# Patient Record
Sex: Female | Born: 1985
Health system: Southern US, Community
[De-identification: ages and names within clinical notes are randomized; demographics above are authoritative.]

## PROBLEM LIST (undated history)

## (undated) ENCOUNTER — Inpatient Hospital Stay (HOSPITAL_COMMUNITY): Payer: Self-pay

## (undated) DIAGNOSIS — I10 Essential (primary) hypertension: Secondary | ICD-10-CM

## (undated) DIAGNOSIS — E119 Type 2 diabetes mellitus without complications: Secondary | ICD-10-CM

## (undated) DIAGNOSIS — D509 Iron deficiency anemia, unspecified: Secondary | ICD-10-CM

## (undated) DIAGNOSIS — E282 Polycystic ovarian syndrome: Secondary | ICD-10-CM

## (undated) HISTORY — PX: LAPAROSCOPIC GASTRIC BANDING: SHX1100

## (undated) HISTORY — PX: TONSILLECTOMY: SUR1361

## (undated) HISTORY — DX: Polycystic ovarian syndrome: E28.2

## (undated) HISTORY — DX: Type 2 diabetes mellitus without complications: E11.9

## (undated) HISTORY — DX: Essential (primary) hypertension: I10

## (undated) HISTORY — PX: WISDOM TOOTH EXTRACTION: SHX21

## (undated) HISTORY — DX: Iron deficiency anemia, unspecified: D50.9

---

## 2008-08-07 ENCOUNTER — Inpatient Hospital Stay (HOSPITAL_COMMUNITY): Admission: EM | Admit: 2008-08-07 | Discharge: 2008-08-10 | Payer: Self-pay | Admitting: Emergency Medicine

## 2008-08-28 ENCOUNTER — Ambulatory Visit: Payer: Self-pay | Admitting: Internal Medicine

## 2008-08-30 ENCOUNTER — Ambulatory Visit: Payer: Self-pay | Admitting: *Deleted

## 2008-08-30 ENCOUNTER — Ambulatory Visit: Payer: Self-pay | Admitting: Internal Medicine

## 2008-10-03 ENCOUNTER — Ambulatory Visit: Payer: Self-pay | Admitting: Internal Medicine

## 2008-10-03 ENCOUNTER — Encounter: Payer: Self-pay | Admitting: Family Medicine

## 2008-10-03 LAB — CONVERTED CEMR LAB
ALT: 13 units/L (ref 0–35)
AST: 12 units/L (ref 0–37)
Basophils Absolute: 0 10*3/uL (ref 0.0–0.1)
Basophils Relative: 0 % (ref 0–1)
Chloride: 107 meq/L (ref 96–112)
Creatinine, Ser: 0.8 mg/dL (ref 0.40–1.20)
Eosinophils Relative: 1 % (ref 0–5)
Hemoglobin: 11.1 g/dL — ABNORMAL LOW (ref 12.0–15.0)
MCHC: 30.2 g/dL (ref 30.0–36.0)
Microalb, Ur: 1.13 mg/dL (ref 0.00–1.89)
Monocytes Absolute: 0.6 10*3/uL (ref 0.1–1.0)
Neutro Abs: 4.3 10*3/uL (ref 1.7–7.7)
Platelets: 261 10*3/uL (ref 150–400)
RDW: 14.4 % (ref 11.5–15.5)
Sodium: 142 meq/L (ref 135–145)
Testosterone: 71.25 ng/dL — ABNORMAL HIGH (ref 10–70)
Total Bilirubin: 0.3 mg/dL (ref 0.3–1.2)
Total Protein: 7.6 g/dL (ref 6.0–8.3)

## 2008-10-11 ENCOUNTER — Ambulatory Visit: Payer: Self-pay | Admitting: Internal Medicine

## 2009-05-25 ENCOUNTER — Ambulatory Visit: Payer: Self-pay | Admitting: Internal Medicine

## 2009-10-02 ENCOUNTER — Ambulatory Visit: Payer: Self-pay | Admitting: Internal Medicine

## 2009-10-02 DIAGNOSIS — I10 Essential (primary) hypertension: Secondary | ICD-10-CM | POA: Insufficient documentation

## 2009-10-02 DIAGNOSIS — D509 Iron deficiency anemia, unspecified: Secondary | ICD-10-CM | POA: Insufficient documentation

## 2009-10-02 DIAGNOSIS — E119 Type 2 diabetes mellitus without complications: Secondary | ICD-10-CM | POA: Insufficient documentation

## 2009-10-02 DIAGNOSIS — E669 Obesity, unspecified: Secondary | ICD-10-CM | POA: Insufficient documentation

## 2009-10-02 LAB — CONVERTED CEMR LAB
BUN: 10 mg/dL (ref 6–23)
Basophils Absolute: 0 10*3/uL (ref 0.0–0.1)
Basophils Relative: 0.3 % (ref 0.0–3.0)
Bilirubin Urine: NEGATIVE
Bilirubin, Direct: 0 mg/dL (ref 0.0–0.3)
CO2: 28 meq/L (ref 19–32)
Chloride: 108 meq/L (ref 96–112)
Cholesterol: 174 mg/dL (ref 0–200)
Creatinine, Ser: 0.8 mg/dL (ref 0.4–1.2)
Eosinophils Absolute: 0.1 10*3/uL (ref 0.0–0.7)
Glucose, Bld: 143 mg/dL — ABNORMAL HIGH (ref 70–99)
Hgb A1c MFr Bld: 7.1 % — ABNORMAL HIGH (ref 4.6–6.5)
Leukocytes, UA: NEGATIVE
MCHC: 33.9 g/dL (ref 30.0–36.0)
MCV: 83.5 fL (ref 78.0–100.0)
Monocytes Absolute: 0.4 10*3/uL (ref 0.1–1.0)
Neutrophils Relative %: 48.7 % (ref 43.0–77.0)
Nitrite: NEGATIVE
Platelets: 227 10*3/uL (ref 150.0–400.0)
RDW: 13.1 % (ref 11.5–14.6)
Total Bilirubin: 0.4 mg/dL (ref 0.3–1.2)
Total Protein: 7.9 g/dL (ref 6.0–8.3)
Triglycerides: 119 mg/dL (ref 0.0–149.0)
pH: 5.5 (ref 5.0–8.0)

## 2009-10-03 ENCOUNTER — Telehealth: Payer: Self-pay | Admitting: Internal Medicine

## 2009-10-04 ENCOUNTER — Encounter: Payer: Self-pay | Admitting: Internal Medicine

## 2009-10-18 ENCOUNTER — Ambulatory Visit: Payer: Self-pay | Admitting: Internal Medicine

## 2009-10-19 ENCOUNTER — Ambulatory Visit (HOSPITAL_COMMUNITY): Admission: RE | Admit: 2009-10-19 | Discharge: 2009-10-19 | Payer: Self-pay | Admitting: Internal Medicine

## 2009-10-19 ENCOUNTER — Encounter: Payer: Self-pay | Admitting: Internal Medicine

## 2009-10-19 ENCOUNTER — Ambulatory Visit: Payer: Self-pay

## 2009-10-19 ENCOUNTER — Ambulatory Visit: Payer: Self-pay | Admitting: Internal Medicine

## 2009-10-24 ENCOUNTER — Encounter: Admission: RE | Admit: 2009-10-24 | Discharge: 2010-01-22 | Payer: Self-pay | Admitting: Internal Medicine

## 2009-11-06 ENCOUNTER — Ambulatory Visit: Payer: Self-pay | Admitting: Internal Medicine

## 2009-11-19 ENCOUNTER — Encounter: Payer: Self-pay | Admitting: Internal Medicine

## 2009-12-27 ENCOUNTER — Encounter: Payer: Self-pay | Admitting: Internal Medicine

## 2010-01-04 ENCOUNTER — Ambulatory Visit: Payer: Self-pay | Admitting: Internal Medicine

## 2010-02-08 ENCOUNTER — Emergency Department (HOSPITAL_COMMUNITY): Admission: EM | Admit: 2010-02-08 | Discharge: 2010-02-08 | Payer: Self-pay | Admitting: Emergency Medicine

## 2010-03-04 ENCOUNTER — Ambulatory Visit: Payer: Self-pay | Admitting: Internal Medicine

## 2010-03-04 LAB — CONVERTED CEMR LAB
CO2: 28 meq/L (ref 19–32)
Glucose, Bld: 309 mg/dL — ABNORMAL HIGH (ref 70–99)
Potassium: 4.2 meq/L (ref 3.5–5.1)
Sodium: 137 meq/L (ref 135–145)

## 2010-04-13 ENCOUNTER — Inpatient Hospital Stay (HOSPITAL_COMMUNITY): Admission: EM | Admit: 2010-04-13 | Discharge: 2010-04-14 | Payer: Self-pay | Admitting: Emergency Medicine

## 2010-08-06 ENCOUNTER — Ambulatory Visit (HOSPITAL_COMMUNITY)
Admission: RE | Admit: 2010-08-06 | Discharge: 2010-08-06 | Payer: Self-pay | Source: Home / Self Care | Admitting: General Surgery

## 2010-08-15 ENCOUNTER — Emergency Department (HOSPITAL_COMMUNITY): Admission: EM | Admit: 2010-08-15 | Discharge: 2009-10-17 | Payer: Self-pay | Admitting: Emergency Medicine

## 2010-08-27 ENCOUNTER — Ambulatory Visit: Payer: Self-pay | Admitting: Internal Medicine

## 2010-08-27 DIAGNOSIS — N949 Unspecified condition associated with female genital organs and menstrual cycle: Secondary | ICD-10-CM

## 2010-08-27 DIAGNOSIS — N938 Other specified abnormal uterine and vaginal bleeding: Secondary | ICD-10-CM | POA: Insufficient documentation

## 2010-08-27 LAB — CONVERTED CEMR LAB
ALT: 21 units/L (ref 0–35)
Albumin: 3.5 g/dL (ref 3.5–5.2)
BUN: 13 mg/dL (ref 6–23)
Basophils Absolute: 0 10*3/uL (ref 0.0–0.1)
Bilirubin Urine: NEGATIVE
CO2: 26 meq/L (ref 19–32)
Chloride: 106 meq/L (ref 96–112)
Glucose, Bld: 179 mg/dL — ABNORMAL HIGH (ref 70–99)
HCT: 35.8 % — ABNORMAL LOW (ref 36.0–46.0)
Iron: 69 ug/dL (ref 42–145)
Lymphs Abs: 3.3 10*3/uL (ref 0.7–4.0)
MCV: 86 fL (ref 78.0–100.0)
Monocytes Absolute: 0.5 10*3/uL (ref 0.1–1.0)
Nitrite: NEGATIVE
Platelets: 232 10*3/uL (ref 150.0–400.0)
Potassium: 4.2 meq/L (ref 3.5–5.1)
RDW: 13.7 % (ref 11.5–14.6)
Specific Gravity, Urine: >=1.03 (ref 1.000–1.030)
TSH: 1.3 microintl units/mL (ref 0.35–5.50)
Total Bilirubin: 0.6 mg/dL (ref 0.3–1.2)
Total Protein, Urine: NEGATIVE mg/dL
Transferrin: 242.9 mg/dL (ref 212.0–360.0)
Vitamin B-12: 232 pg/mL (ref 211–911)
pH: 5.5 (ref 5.0–8.0)

## 2010-09-25 ENCOUNTER — Encounter
Admission: RE | Admit: 2010-09-25 | Discharge: 2010-10-08 | Payer: Self-pay | Source: Home / Self Care | Attending: General Surgery | Admitting: General Surgery

## 2010-09-28 ENCOUNTER — Encounter: Payer: Self-pay | Admitting: General Surgery

## 2010-09-29 ENCOUNTER — Encounter: Payer: Self-pay | Admitting: General Surgery

## 2010-10-03 ENCOUNTER — Encounter: Admit: 2010-10-03 | Payer: Self-pay | Admitting: General Surgery

## 2010-10-08 ENCOUNTER — Ambulatory Visit (HOSPITAL_COMMUNITY)
Admission: RE | Admit: 2010-10-08 | Discharge: 2010-10-08 | Payer: Self-pay | Source: Home / Self Care | Attending: General Surgery | Admitting: General Surgery

## 2010-10-08 NOTE — Assessment & Plan Note (Signed)
Summary: COUGH/NWS   Vital Signs:  Patient profile:   25 year old female Menstrual status:  regular Height:      69 inches Weight:      282 pounds BMI:     41.79 O2 Sat:      97 % on Room air Temp:     98.1 degrees F oral Pulse rate:   102 / minute Pulse rhythm:   regular Resp:     16 per minute BP sitting:   130 / 84  (left arm) Cuff size:   large  Vitals Entered By: Rock Nephew CMA (January 04, 2010 1:58 PM)  Nutrition Counseling: Patient's BMI is greater than 25 and therefore counseled on weight management options.  O2 Flow:  Room air CC: URI symptoms CBG Result 218   Primary Care Provider:  Etta Grandchild MD  CC:  URI symptoms.  History of Present Illness:  URI Symptoms      This is a 25 year old woman who presents with URI symptoms.  The symptoms began 1 week ago.  The severity is described as moderate.  The patient reports nasal congestion, purulent nasal discharge, sore throat, productive cough, earache, and sick contacts.  Associated symptoms include fever of 100.5-103 degrees.  The patient denies stiff neck, dyspnea, wheezing, rash, vomiting, diarrhea, use of an antipyretic, and response to antipyretic.  The patient also reports headache, muscle aches, and severe fatigue.  The patient denies sneezing, seasonal symptoms, and response to antihistamine.    Preventive Screening-Counseling & Management  Alcohol-Tobacco     Alcohol drinks/day: <1     Alcohol type: wine     >5/day in last 3 mos: no     Alcohol Counseling: not indicated; use of alcohol is not excessive or problematic     Feels need to cut down: no     Feels annoyed by complaints: no     Feels guilty re: drinking: no     Needs 'eye opener' in am: no     Smoking Status: never  Clinical Review Panels:  Lipid Management   Cholesterol:  174 (10/02/2009)   LDL (bad choesterol):  101 (10/02/2009)   HDL (good cholesterol):  49.70 (10/02/2009)  Diabetes Management   HgBA1C:  7.1 (10/02/2009)  Creatinine:  0.8 (10/02/2009)   Last Dilated Eye Exam:  normal (09/10/2009)   Last Foot Exam:  yes (01/04/2010)  CBC   WBC:  7.1 (10/02/2009)   RBC:  4.30 (10/02/2009)   Hgb:  12.2 (10/02/2009)   Hct:  35.9 (10/02/2009)   Platelets:  227.0 (10/02/2009)   MCV  83.5 (10/02/2009)   MCHC  33.9 (10/02/2009)   RDW  13.1 (10/02/2009)   PMN:  48.7 (10/02/2009)   Lymphs:  43.4 (10/02/2009)   Monos:  6.2 (10/02/2009)   Eosinophils:  1.4 (10/02/2009)   Basophil:  0.3 (10/02/2009)  Complete Metabolic Panel   Glucose:  143 (10/02/2009)   Sodium:  140 (10/02/2009)   Potassium:  3.9 (10/02/2009)   Chloride:  108 (10/02/2009)   CO2:  28 (10/02/2009)   BUN:  10 (10/02/2009)   Creatinine:  0.8 (10/02/2009)   Albumin:  3.9 (10/02/2009)   Total Protein:  7.9 (10/02/2009)   Calcium:  9.5 (10/02/2009)   Total Bili:  0.4 (10/02/2009)   Alk Phos:  64 (10/02/2009)   SGPT (ALT):  23 (10/02/2009)   SGOT (AST):  18 (10/02/2009)   Medications Prior to Update: 1)  Lantus 100 Unit/ml Soln (Insulin Glargine) .... 40  Units At Bedtime 2)  Toprol Xl 50 Mg Xr24h-Tab (Metoprolol Succinate) .... One By Mouth Daily 3)  Metformin Hcl 1000 Mg Tabs (Metformin Hcl) .... Take 1 Tablet By Mouth Once A Day 4)  Mirena 20 Mcg/24hr Iud (Levonorgestrel) .... Iud 5)  Truetrack Test  Strp (Glucose Blood) .... Test Up To Two Times A Day  Dx:250.00 6)  Lancets  Misc (Lancets) .... Test Up To Two Times A Day 7)  Onetouch Ultra Mini W/device Kit (Blood Glucose Monitoring Suppl) .... Use Once Daily As Directed 8)  Glucotrol Xl 10 Mg Xr24h-Tab (Glipizide) .... Take One Tablet By Mouth Once Daily.  Current Medications (verified): 1)  Lantus 100 Unit/ml Soln (Insulin Glargine) .... 40 Units At Bedtime 2)  Toprol Xl 50 Mg Xr24h-Tab (Metoprolol Succinate) .... One By Mouth Daily 3)  Metformin Hcl 1000 Mg Tabs (Metformin Hcl) .... Take 1 Tablet By Mouth Two Times A Day 4)  Mirena 20 Mcg/24hr Iud (Levonorgestrel) .... Iud 5)   Truetrack Test  Strp (Glucose Blood) .... Test Up To Two Times A Day  Dx:250.00 6)  Lancets  Misc (Lancets) .... Test Up To Two Times A Day 7)  Onetouch Ultra Mini W/device Kit (Blood Glucose Monitoring Suppl) .... Use Once Daily As Directed 8)  Glucotrol Xl 10 Mg Xr24h-Tab (Glipizide) .... Take One Tablet By Mouth Once Daily. 9)  Zithromax Tri-Pak 500 Mg Tab (Azithromycin) .... Take As Directed One By Mouth Once Daily For 3 Days 10)  Mytussin Dac 30-10-100 Mg/39ml Soln (Pseudoephedrine-Codeine-Gg) .... 5-10 Ml By Mouth Qid As Needed For Congestion  Allergies (verified): No Known Drug Allergies  Past History:  Past Medical History: Reviewed history from 10/02/2009 and no changes required. Anemia-iron deficiency Diabetes mellitus, type II Hypertension PCOS  Past Surgical History: Reviewed history from 10/02/2009 and no changes required. Tonsillectomy  Family History: Reviewed history from 10/02/2009 and no changes required. Family History of Arthritis Family History Diabetes 1st degree relative Family History High cholesterol Family History Hypertension Family History of Prostate CA 1st degree relative <50  Social History: Reviewed history from 10/02/2009 and no changes required. 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  The patient denies anorexia, hemoptysis, abdominal pain, hematuria, suspicious skin lesions, and enlarged lymph nodes.   Endo:  Complains of weight change; denies cold intolerance, excessive hunger, excessive thirst, excessive urination, heat intolerance, and polyuria.  Physical Exam  General:  alert, well-developed, well-nourished, well-hydrated, appropriate dress, cooperative to examination, good hygiene, and overweight-appearing.   Eyes:  vision grossly intact, pupils equal, pupils round, and pupils reactive to light.   Ears:  R ear  normal and L ear normal.   Mouth:  Oral mucosa and oropharynx without lesions or exudates.  Teeth in good repair. Neck:  supple, full ROM, no masses, no thyromegaly, no JVD, normal carotid upstroke, no carotid bruits, and no cervical lymphadenopathy.   Lungs:  normal respiratory effort, no intercostal retractions, no accessory muscle use, normal breath sounds, no dullness, no fremitus, no crackles, and no wheezes.   Heart:  Regular rhythm, no murmur, no gallop, no rub, and no JVD.  normal rate, regular rhythm, no murmur, no gallop, no rub, no JVD, and tachycardia.   Abdomen:  soft, non-tender, normal bowel sounds, no distention, no masses, no guarding, no rigidity, no rebound tenderness, no hepatomegaly, and no splenomegaly.   Msk:  normal  ROM, no joint tenderness, no joint swelling, no joint warmth, no redness over joints, no joint deformities, no joint instability, and no crepitation.   Pulses:  R and L carotid,radial,femoral,dorsalis pedis and posterior tibial pulses are full and equal bilaterally Extremities:  No clubbing, cyanosis, edema, or deformity noted with normal full range of motion of all joints.   Neurologic:  No cranial nerve deficits noted. Station and gait are normal. Plantar reflexes are down-going bilaterally. DTRs are symmetrical throughout. Sensory, motor and coordinative functions appear intact. Skin:  Intact without suspicious lesions or rashes Cervical Nodes:  No lymphadenopathy noted Axillary Nodes:  no R axillary adenopathy and no L axillary adenopathy.   Inguinal Nodes:  No significant adenopathy Psych:  Cognition and judgment appear intact. Alert and cooperative with normal attention span and concentration. No apparent delusions, illusions, hallucinations  Diabetes Management Exam:    Foot Exam (with socks and/or shoes not present):       Sensory-Pinprick/Light touch:          Left medial foot (L-4): normal          Left dorsal foot (L-5): normal          Left lateral  foot (S-1): normal          Right medial foot (L-4): normal          Right dorsal foot (L-5): normal          Right lateral foot (S-1): normal       Sensory-Monofilament:          Left foot: normal          Right foot: normal       Inspection:          Left foot: normal          Right foot: normal       Nails:          Left foot: normal          Right foot: normal   Impression & Recommendations:  Problem # 1:  BRONCHITIS-ACUTE (ICD-466.0) Assessment New  Her updated medication list for this problem includes:    Zithromax Tri-pak 500 Mg Tab (Azithromycin) .Marland Kitchen... Take as directed one by mouth once daily for 3 days    Mytussin Dac 30-10-100 Mg/60ml Soln (Pseudoephedrine-codeine-gg) .Marland Kitchen... 5-10 ml by mouth qid as needed for congestion  Problem # 2:  COUGH (ICD-786.2) Assessment: New Chest xary to look for pna, edema, etc. Orders: T-2 View CXR (71020TC)  Problem # 3:  DIABETES MELLITUS, TYPE II (ICD-250.00) Assessment: Deteriorated  I increased he dose of metformin today Her updated medication list for this problem includes:    Lantus 100 Unit/ml Soln (Insulin glargine) .Marland KitchenMarland KitchenMarland KitchenMarland Kitchen 40 units at bedtime    Metformin Hcl 1000 Mg Tabs (Metformin hcl) .Marland Kitchen... Take 1 tablet by mouth two times a day    Glucotrol Xl 10 Mg Xr24h-tab (Glipizide) .Marland Kitchen... Take one tablet by mouth once daily.  Labs Reviewed: Creat: 0.8 (10/02/2009)     Last Eye Exam: normal (09/10/2009) Reviewed HgBA1c results: 7.1 (10/02/2009)  Problem # 4:  HYPERTENSION (ICD-401.9) Assessment: Unchanged  Her updated medication list for this problem includes:    Toprol Xl 50 Mg Xr24h-tab (Metoprolol succinate) ..... One by mouth daily  BP today: 130/84 Prior BP: 126/84 (11/06/2009)  Prior 10 Yr Risk Heart Disease: 1 % (11/06/2009)  Labs Reviewed: K+: 3.9 (10/02/2009) Creat: : 0.8 (10/02/2009)   Chol: 174 (10/02/2009)   HDL: 49.70 (10/02/2009)  LDL: 101 (10/02/2009)   TG: 119.0 (10/02/2009)  Complete Medication  List: 1)  Lantus 100 Unit/ml Soln (Insulin glargine) .... 40 units at bedtime 2)  Toprol Xl 50 Mg Xr24h-tab (Metoprolol succinate) .... One by mouth daily 3)  Metformin Hcl 1000 Mg Tabs (Metformin hcl) .... Take 1 tablet by mouth two times a day 4)  Mirena 20 Mcg/24hr Iud (Levonorgestrel) .... Iud 5)  Truetrack Test Strp (Glucose blood) .... Test up to two times a day  dx:250.00 6)  Lancets Misc (Lancets) .... Test up to two times a day 7)  Onetouch Ultra Mini W/device Kit (Blood glucose monitoring suppl) .... Use once daily as directed 8)  Glucotrol Xl 10 Mg Xr24h-tab (Glipizide) .... Take one tablet by mouth once daily. 9)  Zithromax Tri-pak 500 Mg Tab (Azithromycin) .... Take as directed one by mouth once daily for 3 days 10)  Mytussin Dac 30-10-100 Mg/60ml Soln (Pseudoephedrine-codeine-gg) .... 5-10 ml by mouth qid as needed for congestion  Patient Instructions: 1)  Please schedule a follow-up appointment in 2 weeks. 2)  Take your antibiotic as prescribed until ALL of it is gone, but stop if you develop a rash or swelling and contact our office as soon as possible. 3)  Acute bronchitis symptoms for less than 10 days are not helped by antibiotics. take over the counter cough medications. call if no improvment in  5-7 days, sooner if increasing cough, fever, or new symptoms( shortness of breath, chest pain). Prescriptions: METFORMIN HCL 1000 MG TABS (METFORMIN HCL) Take 1 tablet by mouth two times a day  #60 x 11   Entered and Authorized by:   Etta Grandchild MD   Signed by:   Etta Grandchild MD on 01/04/2010   Method used:   Electronically to        [facility] [address] [phone]* (retail)       [address] [place], [place]  [address]       Ph: [phone]       Fax: [phone]   RxID:   [id number] ZITHROMAX TRI-PAK 500 MG TAB (AZITHROMYCIN) Take as directed one by mouth once daily for 3 days  #3 x 0   Entered and Authorized by:   Etta Grandchild MD   Signed by:    Etta Grandchild MD on 01/04/2010   Method used:   Electronically to        [facility] [address] [phone]* (retail)       [address] [place], [place]  [address]       Ph: [phone]       Fax: [phone]   RxID:   [id number] MYTUSSIN DAC 30-10-100 MG/5ML SOLN (PSEUDOEPHEDRINE-CODEINE-GG) 5-10 ml by mouth QID as needed for congestion  #6 ounces x 0   Entered and Authorized by:   Etta Grandchild MD   Signed by:   Etta Grandchild MD on 01/04/2010   Method used:   Print then Give to Patient   RxID:   [id number] ZITHROMAX TRI-PAK 500 MG TAB (AZITHROMYCIN) Take as directed one by mouth once daily for 3 days  #3 x 0   Entered and Authorized by:   Etta Grandchild MD   Signed by:   Etta Grandchild MD on 01/04/2010   Method used:   Electronically to        [facility] (retail)  [address]       [place], [place]  [address]       Ph: [phone]       Fax: [phone]   RxID:   [id number]

## 2010-10-08 NOTE — Letter (Signed)
Summary: Surgical Options notes/[facility]  Options notes/[facility]   Imported By: Lester Collinston 11/21/2009 08:09:39  _____________________________________________________________________  External Attachment:    Type:   Image     Comment:   External Document

## 2010-10-08 NOTE — Letter (Signed)
Summary: Results Follow-up Letter  [facility]  [address] [place], [place] [address]   Phone: [phone]  Fax: [phone]    10/04/2009  [address] [place], [place]  [address]  Dear Ms. BRAXTON,   The following are the results of your recent test(s):  Test     Result     Blood sugar     good average Liver/kidney   normal CBC       normal Thyroid     normal Urine       normal   _________________________________________________________  Please call for an appointment as directed _________________________________________________________ _________________________________________________________ _________________________________________________________  Sincerely,  Sanda Linger MD [facility]

## 2010-10-08 NOTE — Assessment & Plan Note (Signed)
Summary: sugar so high last pm/don't register-loss fee'g in one toe-stc   Vital Signs:  Patient profile:   25 year old female Menstrual status:  regular Height:      69 inches Weight:      268 pounds BMI:     39.72 O2 Sat:      97 % on Room air Temp:     98.6 degrees F oral Pulse rate:   97 / minute Pulse rhythm:   regular Resp:     16 per minute BP sitting:   138 / 88  (left arm) Cuff size:   large  Vitals Entered By: Rock Nephew CMA (March 04, 2010 2:03 PM)  Nutrition Counseling: Patient's BMI is greater than 25 and therefore counseled on weight management options.  O2 Flow:  Room air CC: elevated CBG, wouldnt register on the meter and toe numbness, freq urination Is Patient Diabetic? Yes Did you bring your meter with you today? No Pain Assessment Patient in pain? no      CBG Result 317   Primary Care Provider:  Etta Grandchild MD  CC:  elevated CBG, wouldnt register on the meter and toe numbness, and freq urination.  History of Present Illness:  Follow-Up Visit      This is a 25 year old woman who presents for Follow-up visit.  The patient complains of high blood sugar symptoms, but denies chest pain, palpitations, dizziness, syncope, low blood sugar symptoms, edema, SOB, DOE, PND, and orthopnea.  Since the last visit the patient notes a recent ED visit.  The patient reports taking meds as prescribed, monitoring BP, monitoring blood sugars, and dietary noncompliance.  When questioned about possible medication side effects, the patient notes nausea.    Preventive Screening-Counseling & Management  Alcohol-Tobacco     Alcohol drinks/day: <1     Alcohol type: wine     >5/day in last 3 mos: no     Alcohol Counseling: not indicated; use of alcohol is not excessive or problematic     Feels need to cut down: no     Feels annoyed by complaints: no     Feels guilty re: drinking: no     Needs 'eye opener' in am: no     Smoking Status:  never  Hep-HIV-STD-Contraception     Hepatitis Risk: risk noted     HIV Risk: no risk noted     STD Risk: no risk noted      Sexual History:  currently monogamous.        Drug Use:  no.        Blood Transfusions:  no.    Medications Prior to Update: 1)  Lantus 100 Unit/ml Soln (Insulin Glargine) .... 40 Units At Bedtime 2)  Toprol Xl 50 Mg Xr24h-Tab (Metoprolol Succinate) .... One By Mouth Daily 3)  Metformin Hcl 1000 Mg Tabs (Metformin Hcl) .... Take 1 Tablet By Mouth Two Times A Day 4)  Mirena 20 Mcg/24hr Iud (Levonorgestrel) .... Iud 5)  Truetrack Test  Strp (Glucose Blood) .... Test Up To Two Times A Day  Dx:250.00 6)  Lancets  Misc (Lancets) .... Test Up To Two Times A Day 7)  Onetouch Ultra Mini W/device Kit (Blood Glucose Monitoring Suppl) .... Use Once Daily As Directed 8)  Glucotrol Xl 10 Mg Xr24h-Tab (Glipizide) .... Take One Tablet By Mouth Once Daily. 9)  Mytussin Dac 30-10-100 Mg/35ml Soln (Pseudoephedrine-Codeine-Gg) .... 5-10 Ml By Mouth Qid As Needed For Congestion  Current Medications (  verified): 1)  Lantus 100 Unit/ml Soln (Insulin Glargine) .... 80 Units At Bedtime 2)  Toprol Xl 50 Mg Xr24h-Tab (Metoprolol Succinate) .... One By Mouth Daily 3)  Mirena 20 Mcg/24hr Iud (Levonorgestrel) .... Iud 4)  Truetrack Test  Strp (Glucose Blood) .... Test Up To Two Times A Day  Dx:250.00 5)  Lancets  Misc (Lancets) .... Test Up To Two Times A Day 6)  Onetouch Ultra Mini W/device Kit (Blood Glucose Monitoring Suppl) .... Use Once Daily As Directed 7)  Glucotrol Xl 10 Mg Xr24h-Tab (Glipizide) .... Take One Tablet By Mouth Once Daily. 8)  Actos 15 Mg Tabs (Pioglitazone Hcl) .... One By Mouth Once Daily 9)  Janumet 50-500 Mg Tabs (Sitagliptin-Metformin Hcl) .... One By Mouth Two Times A Day  Allergies (verified): No Known Drug Allergies  Past History:  Past Medical History: Last updated: 10/02/2009 Anemia-iron deficiency Diabetes mellitus, type  II Hypertension PCOS  Past Surgical History: Last updated: 10/02/2009 Tonsillectomy  Family History: Last updated: 10/02/2009 Family History of Arthritis Family History Diabetes 1st degree relative Family History High cholesterol Family History Hypertension Family History of Prostate CA 1st degree relative <50  Social History: Last updated: 10/02/2009 Occupation: CSR Single Never Smoked Alcohol use-yes Drug use-no Regular exercise-yes  Risk Factors: Alcohol Use: <1 (03/04/2010) >5 drinks/d w/in last 3 months: no (03/04/2010) Exercise: yes (10/02/2009)  Risk Factors: Smoking Status: never (03/04/2010)  Family History: Reviewed history from 10/02/2009 and no changes required. Family History of Arthritis Family History Diabetes 1st degree relative Family History High cholesterol Family History Hypertension Family History of Prostate CA 1st degree relative <50  Social History: Reviewed history from 10/02/2009 and no changes required. 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  The patient denies anorexia, fever, weight gain, chest pain, syncope, dyspnea on exertion, peripheral edema, prolonged cough, headaches, hemoptysis, abdominal pain, hematuria, depression, and unusual weight change.    Physical Exam  General:  alert, well-developed, well-nourished, well-hydrated, appropriate dress, cooperative to examination, good hygiene, and overweight-appearing.   Head:  normocephalic, atraumatic, no abnormalities observed, and no abnormalities palpated.   Eyes:  vision grossly intact, pupils equal, pupils round, and pupils reactive to light.   Mouth:  Oral mucosa and oropharynx without lesions or exudates.  Teeth in good repair. Neck:  supple, full ROM, no masses, no thyromegaly, no JVD, normal carotid upstroke, no carotid bruits, and no cervical lymphadenopathy.    Lungs:  normal respiratory effort, no intercostal retractions, no accessory muscle use, normal breath sounds, no dullness, no fremitus, no crackles, and no wheezes.   Heart:  Regular rhythm, no murmur, no gallop, no rub, and no JVD.  normal rate, regular rhythm, no murmur, no gallop, no rub, no JVD, and tachycardia.   Abdomen:  soft, non-tender, normal bowel sounds, no distention, no masses, no guarding, no rigidity, no rebound tenderness, no hepatomegaly, and no splenomegaly.   Msk:  normal ROM, no joint tenderness, no joint swelling, no joint warmth, no redness over joints, no joint deformities, no joint instability, and no crepitation.   Pulses:  R and L carotid,radial,femoral,dorsalis pedis and posterior tibial pulses are full and equal bilaterally Extremities:  No clubbing, cyanosis, edema, or deformity noted with normal full range of motion of all joints.   Neurologic:  No cranial nerve deficits noted. Station and gait are normal. Plantar reflexes are down-going bilaterally. DTRs are symmetrical throughout.  Sensory, motor and coordinative functions appear intact. Skin:  Intact without suspicious lesions or rashes Cervical Nodes:  No lymphadenopathy noted Psych:  Cognition and judgment appear intact. Alert and cooperative with normal attention span and concentration. No apparent delusions, illusions, hallucinations  Diabetes Management Exam:    Foot Exam (with socks and/or shoes not present):       Sensory-Pinprick/Light touch:          Left medial foot (L-4): normal          Left dorsal foot (L-5): normal          Left lateral foot (S-1): normal          Right medial foot (L-4): normal          Right dorsal foot (L-5): normal          Right lateral foot (S-1): normal       Sensory-Monofilament:          Left foot: normal          Right foot: normal       Inspection:          Left foot: normal          Right foot: normal       Nails:          Left foot: normal          Right foot:  normal   Impression & Recommendations:  Problem # 1:  DIABETES MELLITUS, TYPE II (ICD-250.00) Assessment Deteriorated  The following medications were removed from the medication list:    Metformin Hcl 1000 Mg Tabs (Metformin hcl) .Marland Kitchen... Take 1 tablet by mouth two times a day Her updated medication list for this problem includes:    Lantus 100 Unit/ml Soln (Insulin glargine) .[facility] 80 units at bedtime    Glucotrol Xl 10 Mg Xr24h-tab (Glipizide) .Marland Kitchen... Take one tablet by mouth once daily.    Actos 15 Mg Tabs (Pioglitazone hcl) ..... One by mouth once daily    Janumet 50-500 Mg Tabs (Sitagliptin-metformin hcl) ..... One by mouth two times a day    Novolog Penfill 100 Unit/ml Soln (Insulin aspart) .Marland Kitchen... 11  units three times a day with meals  Orders: Capillary Blood Glucose/CBG ([address]) Venipuncture ([address]) TLB-BMP (Basic Metabolic Panel-BMET) (80048-METABOL) TLB-A1C / Hgb A1C (Glycohemoglobin) (83036-A1C) Diabetic Clinic Referral (Diabetic)  Labs Reviewed: Creat: 0.8 (10/02/2009)     Last Eye Exam: normal (09/10/2009) Reviewed HgBA1c results: 7.1 (10/02/2009)  Problem # 2:  HYPERTENSION (ICD-401.9) Assessment: Unchanged  Her updated medication list for this problem includes:    Toprol Xl 50 Mg Xr24h-tab (Metoprolol succinate) ..... One by mouth daily  Orders: Venipuncture ([address]) TLB-BMP (Basic Metabolic Panel-BMET) (80048-METABOL) TLB-A1C / Hgb A1C (Glycohemoglobin) (83036-A1C)  Complete Medication List: 1)  Lantus 100 Unit/ml Soln (Insulin glargine) .... 80 units at bedtime 2)  Toprol Xl 50 Mg Xr24h-tab (Metoprolol succinate) .... One by mouth daily 3)  Mirena 20 Mcg/24hr Iud (Levonorgestrel) .... Iud 4)  Truetrack Test Strp (Glucose blood) .... Test up to two times a day  dx:250.00 5)  Lancets Misc (Lancets) .... Test up to two times a day 6)  Onetouch Ultra Mini W/device Kit (Blood glucose monitoring suppl) .... Use once daily as directed 7)  Glucotrol Xl 10 Mg Xr24h-tab  (Glipizide) .... Take one tablet by mouth once daily. 8)  Actos 15 Mg Tabs (Pioglitazone hcl) .... One by mouth once daily 9)  Janumet 50-500 Mg Tabs (Sitagliptin-metformin hcl) .... One  by mouth two times a day 10)  Novolog Penfill 100 Unit/ml Soln (Insulin aspart) .Marland Kitchen.. 11  units three times a day with meals  Patient Instructions: 1)  Please schedule a follow-up appointment in 1 month. 2)  It is important that you exercise regularly at least 20 minutes 5 times a week. If you develop chest pain, have severe difficulty breathing, or feel very tired , stop exercising immediately and seek medical attention. 3)  You need to lose weight. Consider a lower calorie diet and regular exercise.  4)  Check your blood sugars regularly. If your readings are usually above 200 or below 70 you should contact our office. 5)  It is important that your Diabetic A1c level is checked every 3 months. 6)  See your eye doctor yearly to check for diabetic eye damage. 7)  Check your feet each night for sore areas, calluses or signs of infection. 8)  Check your Blood Pressure regularly. If it is above 130/80: you should make an appointment. Prescriptions: NOVOLOG PENFILL 100 UNIT/ML SOLN (INSULIN ASPART) 11  units three times a day with meals  #1 month x 11   Entered and Authorized by:   Etta Grandchild MD   Signed by:   Etta Grandchild MD on 03/04/2010   Method used:   Electronically to        [facility]       [address]       [place], [place]  [address]       Ph: [phone]       Fax: [phone]   RxID:   [id number] LANTUS 100 UNIT/ML SOLN (INSULIN GLARGINE) 80 units at bedtime  #1 month x 11   Entered and Authorized by:   Etta Grandchild MD   Signed by:   Etta Grandchild MD on 03/04/2010   Method used:   Electronically to        [facility] (retail)       [address]       [place], [place]  [address]       Ph:  [phone]       Fax: [phone]   RxID:   [id number] JANUMET 50-500 MG TABS (SITAGLIPTIN-METFORMIN HCL) One by mouth two times a day  #140 x 0   Entered and Authorized by:   Etta Grandchild MD   Signed by:   Etta Grandchild MD on 03/04/2010   Method used:   Samples Given   RxID:   [id number] ACTOS 15 MG TABS (PIOGLITAZONE HCL) One by mouth once daily  #42 x 0   Entered and Authorized by:   Etta Grandchild MD   Signed by:   Etta Grandchild MD on 03/04/2010   Method used:   Samples Given   RxID:   [id number]

## 2010-10-08 NOTE — Letter (Signed)
Summary: Lipid Letter  [facility]  [address] [place], [place] [address]   Phone: [phone]  Fax: [phone]    10/04/2009  Anna Nguyen [address] [place], [place]  [address]  Dear Anna Nguyen:  We have carefully reviewed your last lipid profile from 10/02/2009 and the results are noted below with a summary of recommendations for lipid management.    Cholesterol:       174     Goal: <200   HDL "good" Cholesterol:   62.95     Goal: >40   LDL "bad" Cholesterol:   101     Goal: <100   Triglycerides:       119.0     Goal: <150    pretty good results    TLC Diet (Therapeutic Lifestyle Change): Saturated Fats & Transfatty acids should be kept < 7% of total calories ***Reduce Saturated Fats Polyunstaurated Fat can be up to 10% of total calories Monounsaturated Fat Fat can be up to 20% of total calories Total Fat should be no greater than 25-35% of total calories Carbohydrates should be 50-60% of total calories Protein should be approximately 15% of total calories Fiber should be at least 20-30 grams a day ***Increased fiber may help lower LDL Total Cholesterol should be < 200mg /day Consider adding plant stanol/sterols to diet (example: Benacol spread) ***A higher intake of unsaturated fat may reduce Triglycerides and Increase HDL    Adjunctive Measures (may lower LIPIDS and reduce risk of Heart Attack) include: Aerobic Exercise (20-30 minutes 3-4 times a week) Limit Alcohol Consumption Weight Reduction Aspirin 75-81 mg a day by mouth (if not allergic or contraindicated) Dietary Fiber 20-30 grams a day by mouth     Current Medications: 1)    Lantus 100 Unit/ml Soln (Insulin glargine) .... 40 units at bedtime 2)    Toprol Xl 25 Mg Xr24h-tab (Metoprolol succinate) .... Take 1 tablet by mouth once a day 3)    Metformin Hcl 1000 Mg Tabs (Metformin hcl) .... Take 1 tablet by mouth once a day 4)    Mirena 20 Mcg/24hr Iud (Levonorgestrel) .... Iud 5)     Truetrack Test  Strp (Glucose blood) .... Test up to two times a day  dx:250.00 6)    Lancets  Misc (Lancets) .... Test up to two times a day 7)    Onetouch Ultra Mini W/device Kit (Blood glucose monitoring suppl) .... Use once daily as directed  If you have any questions, please call. We appreciate being able to work with you.   Sincerely,    [facility] Etta Grandchild MD

## 2010-10-08 NOTE — Assessment & Plan Note (Signed)
Summary: NEW [employer] PT--PKG/OFF-#-STC   Vital Signs:  Patient profile:   25 year old female Menstrual status:  regular LMP:     09/17/2009 Height:      69 inches Weight:      286 pounds BMI:     42.39 O2 Sat:      97 % on Room air Temp:     98.7 degrees F oral Pulse rate:   107 / minute Pulse rhythm:   regular Resp:     16 per minute BP sitting:   128 / 82  (left arm) Cuff size:   large  Vitals Entered By: Rock Nephew CMA (October 02, 2009 10:34 AM)  Nutrition Counseling: Patient's BMI is greater than 25 and therefore counseled on weight management options.  O2 Flow:  Room air  Primary Care Provider:  Etta Grandchild MD   History of Present Illness: New to me she c/o's inability to lose weight despite wt. watchers and exercise for 6 months.  She has had a mildly elevated HR since September 2010.  Hypertension History:      She complains of palpitations, but denies headache, chest pain, dyspnea with exertion, orthopnea, PND, peripheral edema, visual symptoms, neurologic problems, syncope, and side effects from treatment.  She notes no problems with any antihypertensive medication side effects.        Positive major cardiovascular risk factors include diabetes, hyperlipidemia, and hypertension.  Negative major cardiovascular risk factors include female age less than 57 years old, negative family history for ischemic heart disease, and non-tobacco-user status.        Further assessment for target organ damage reveals no history of ASHD, cardiac end-organ damage (CHF/LVH), stroke/TIA, peripheral vascular disease, renal insufficiency, or hypertensive retinopathy.     Preventive Screening-Counseling & Management  Alcohol-Tobacco     Alcohol drinks/day: <1     Alcohol type: wine     >5/day in last 3 mos: no     Alcohol Counseling: not indicated; use of alcohol is not excessive or problematic     Feels need to cut down: no     Feels annoyed by complaints: no     Feels guilty  re: drinking: no     Needs 'eye opener' in am: no     Smoking Status: never  Caffeine-Diet-Exercise     Does Patient Exercise: yes     Type of exercise: all     Exercise (avg: min/session): 30-60     Times/week: 4     Exercise Counseling: to improve exercise regimen  Hep-HIV-STD-Contraception     Hepatitis Risk: risk noted     HIV Risk: no risk noted     STD Risk: no risk noted      Sexual History:  currently monogamous.        Drug Use:  no.        Blood Transfusions:  no.    Current Medications (verified): 1)  Lantus 100 Unit/ml Soln (Insulin Glargine) .... 40 Units At Bedtime 2)  Toprol Xl 25 Mg Xr24h-Tab (Metoprolol Succinate) .... Take 1 Tablet By Mouth Once A Day 3)  Metformin Hcl 1000 Mg Tabs (Metformin Hcl) .... Take 1 Tablet By Mouth Once A Day 4)  Mirena 20 Mcg/24hr Iud (Levonorgestrel) .... Iud 5)  Truetrack Test  Strp (Glucose Blood) .... Test Up To Two Times A Day  Dx:250.00 6)  Lancets  Misc (Lancets) .... 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  The patient denies anorexia, chest pain, syncope, dyspnea on exertion, peripheral edema, prolonged cough, headaches, hemoptysis, abdominal pain,  hematuria, difficulty walking, depression, abnormal bleeding, and enlarged lymph nodes.   CV:  Complains of palpitations; denies chest pain or discomfort, difficulty breathing at night, fainting, fatigue, lightheadness, near fainting, shortness of breath with exertion, and swelling of feet. Endo:  Complains of weight change; denies cold intolerance, excessive hunger, excessive thirst, excessive urination, and polyuria. Heme:  Denies abnormal bruising, bleeding, enlarge lymph nodes, fevers, pallor, and skin discoloration.  Physical Exam  General:  alert, well-developed, well-nourished, well-hydrated, appropriate dress, cooperative to examination, good hygiene, and overweight-appearing.   Head:  normocephalic, atraumatic, no abnormalities observed, and no abnormalities palpated.   Eyes:  vision grossly intact, pupils equal, pupils round, and pupils reactive to light.   Mouth:  Oral mucosa and oropharynx without lesions or exudates.  Teeth in good repair. Neck:  supple, full ROM, no masses, no thyromegaly, no JVD, normal carotid upstroke, no carotid bruits, and no cervical lymphadenopathy.   Lungs:  normal respiratory effort, no intercostal retractions, no accessory muscle use, normal breath sounds, no dullness, no fremitus, no crackles, and no wheezes.   Heart:  Regular rhythm, no murmur, no gallop, no rub, and no JVD.  normal rate, regular rhythm, no murmur, no gallop, no rub, no JVD, and tachycardia.   Abdomen:  soft, non-tender, normal bowel sounds, no distention, no masses, no guarding, no rigidity, no rebound tenderness, no hepatomegaly, and no splenomegaly.   Msk:  normal ROM, no joint tenderness, no joint swelling, no joint warmth, no redness over joints, no joint deformities, no joint instability, and no crepitation.   Pulses:  R and L carotid,radial,femoral,dorsalis pedis and posterior tibial pulses are full and equal bilaterally Extremities:  No clubbing, cyanosis, edema, or deformity noted  with normal full range of motion of all joints.   Neurologic:  No cranial nerve deficits noted. Station and gait are normal. Plantar reflexes are down-going bilaterally. DTRs are symmetrical throughout. Sensory, motor and coordinative functions appear intact. Skin:  Intact without suspicious lesions or rashes Cervical Nodes:  no anterior cervical adenopathy and no posterior cervical adenopathy.   Axillary Nodes:  no R axillary adenopathy and no L axillary adenopathy.   Psych:  Cognition and judgment appear intact. Alert and cooperative with normal attention span and concentration. No apparent delusions, illusions, hallucinations Additional Exam:  EKG shows Sinus tach. with no other abnormalities.  Diabetes Management Exam:    Foot Exam (with socks and/or shoes not present):       Sensory-Pinprick/Light touch:          Left medial foot (L-4): normal          Left dorsal foot (L-5): normal          Left lateral  foot (S-1): normal          Right medial foot (L-4): normal          Right dorsal foot (L-5): normal          Right lateral foot (S-1): normal       Sensory-Monofilament:          Left foot: normal          Right foot: normal       Inspection:          Left foot: normal          Right foot: normal       Nails:          Left foot: normal          Right foot: normal    Eye Exam:       Eye Exam done elsewhere          Date: 09/10/2009          Results: normal          Done by: MD in [facility]   Impression & Recommendations:  Problem # 1:  UNSPECIFIED TACHYCARDIA (ICD-785.0) Assessment New this does not sound pathological but will refer to Card. for eval. Orders: Cardiology Referral (Cardiology) EKG w/ Interpretation (93000)  Problem # 2:  OBESITY, UNSPECIFIED (ICD-278.00) Assessment: Deteriorated  Orders: Venipuncture (16109) TLB-Lipid Panel (80061-LIPID) TLB-BMP (Basic Metabolic Panel-BMET) (80048-METABOL) TLB-CBC Platelet - w/Differential  (85025-CBCD) TLB-Hepatic/Liver Function Pnl (80076-HEPATIC) TLB-TSH (Thyroid Stimulating Hormone) (84443-TSH) TLB-A1C / Hgb A1C (Glycohemoglobin) (83036-A1C) TLB-Udip w/ Micro (81001-URINE) Diabetic Clinic Referral (Diabetic) Nutrition Referral (Nutrition)  Ht: 69 (10/02/2009)   Wt: 286 (10/02/2009)   BMI: 42.39 (10/02/2009)  Problem # 3:  HYPERTENSION (ICD-401.9) Assessment: Improved  Her updated medication list for this problem includes:    Toprol Xl 25 Mg Xr24h-tab (Metoprolol succinate) .Marland Kitchen... Take 1 tablet by mouth once a day  Orders: Venipuncture (60454) TLB-Lipid Panel (80061-LIPID) TLB-BMP (Basic Metabolic Panel-BMET) (80048-METABOL) TLB-CBC Platelet - w/Differential (85025-CBCD) TLB-Hepatic/Liver Function Pnl (80076-HEPATIC) TLB-TSH (Thyroid Stimulating Hormone) (84443-TSH) TLB-A1C / Hgb A1C (Glycohemoglobin) (83036-A1C) TLB-Udip w/ Micro (81001-URINE)  BP today: 128/82  10 Yr Risk Heart Disease: Not enough information  Labs Reviewed: K+: 4.0 (10/03/2008) Creat: : 0.80 (10/03/2008)     Problem # 4:  DIABETES MELLITUS, TYPE II (ICD-250.00) Assessment: Deteriorated  Her updated medication list for this problem includes:    Lantus 100 Unit/ml Soln (Insulin glargine) .Marland KitchenMarland KitchenMarland KitchenMarland Kitchen 40 units at bedtime    Metformin Hcl 1000 Mg Tabs (Metformin hcl) .Marland Kitchen... Take 1 tablet by mouth once a day  Orders: Venipuncture (09811) TLB-Lipid Panel (80061-LIPID) TLB-BMP (Basic Metabolic Panel-BMET) (80048-METABOL) TLB-CBC Platelet - w/Differential (85025-CBCD) TLB-Hepatic/Liver Function Pnl (80076-HEPATIC) TLB-TSH (Thyroid Stimulating Hormone) (84443-TSH) TLB-A1C / Hgb A1C (Glycohemoglobin) (83036-A1C) TLB-Udip w/ Micro (81001-URINE) Diabetic Clinic Referral (Diabetic) Nutrition Referral (Nutrition)  Labs Reviewed: Creat: 0.80 (10/03/2008)     Last Eye Exam: normal (09/10/2009)  Problem # 5:  ANEMIA-IRON DEFICIENCY (ICD-280.9) Assessment: Unchanged  Orders: Venipuncture  (91478) TLB-Lipid Panel (80061-LIPID) TLB-BMP (Basic Metabolic Panel-BMET) (80048-METABOL) TLB-CBC Platelet - w/Differential (85025-CBCD) TLB-Hepatic/Liver Function Pnl (80076-HEPATIC) TLB-TSH (Thyroid Stimulating Hormone) (84443-TSH) TLB-A1C / Hgb A1C (Glycohemoglobin) (83036-A1C) TLB-Udip w/ Micro (81001-URINE)  Complete Medication List: 1)  Lantus 100 Unit/ml Soln (Insulin glargine) .... 40 units at bedtime 2)  Toprol Xl 25 Mg Xr24h-tab (Metoprolol succinate) .... Take 1 tablet by mouth once a day 3)  Metformin Hcl 1000 Mg Tabs (Metformin hcl) .... Take 1 tablet by mouth  once a day 4)  Mirena 20 Mcg/24hr Iud (Levonorgestrel) .... Iud 5)  Truetrack Test Strp (Glucose blood) .... Test up to two times a day  dx:250.00 6)  Lancets Misc (Lancets) .... Test up to two times a day 7)  Onetouch Ultra Mini W/device Kit (Blood glucose monitoring suppl) .... Use once daily as directed  Other Orders: Glucose, (CBG) (42706)  Hypertension Assessment/Plan:      The patient's hypertensive risk group is category C: Target organ damage and/or diabetes.  Today's blood pressure is 128/82.  Her blood pressure goal is < 130/80.  Patient Instructions: 1)  Please schedule a follow-up appointment in 1 month. 2)  It is important that you exercise regularly at least 20 minutes 5 times a week. If you develop chest pain, have severe difficulty breathing, or feel very tired , stop exercising immediately and seek medical attention. 3)  You need to lose weight. Consider a lower calorie diet and regular exercise.  4)  Check your blood sugars regularly. If your readings are usually above 200  or below 70 you should contact our office. 5)  It is important that your Diabetic A1c level is checked every 3 months. 6)  See your eye doctor yearly to check for diabetic eye damage. 7)  Check your feet each night for sore areas, calluses or signs of infection. 8)  Check your Blood Pressure regularly. If it is above 130/80:  you should make an appointment. Prescriptions: ONETOUCH ULTRA MINI W/DEVICE KIT (BLOOD GLUCOSE MONITORING SUPPL) Use once daily as directed  #1 x 0   Entered and Authorized by:   Etta Grandchild MD   Signed by:   Etta Grandchild MD on 10/02/2009   Method used:   Samples Given   RxID:   [id number] TRUETRACK TEST  STRP (GLUCOSE BLOOD) test up to two times a day  dx:250.00  #100 x 0   Entered by:   Rock Nephew CMA   Authorized by:   Etta Grandchild MD   Signed by:   Etta Grandchild MD on 10/02/2009   Method used:   Electronically to        [facility] (retail)       [address]       [place], [place]  [address]       Ph: [phone]       Fax: [phone]   RxID:   [id number]   Laboratory Results   Blood Tests   Date/Time Received: Rock Nephew CMA  October 02, 2009 10:38 AM   CBG Random:: 171mg /dL

## 2010-10-08 NOTE — Letter (Signed)
Summary: [facility]   Imported By: Sherian Rein 01/18/2010 11:26:26  _____________________________________________________________________  External Attachment:    Type:   Image     Comment:   External Document

## 2010-10-08 NOTE — Assessment & Plan Note (Signed)
Summary: 1 MO ROV /NWS  # rs'd conflict/cd   Vital Signs:  Patient profile:   25 year old female Menstrual status:  regular Height:      69 inches Weight:      284 pounds BMI:     42.09 O2 Sat:      99 % on Room air Temp:     97.5 degrees F oral Pulse rate:   90 / minute Pulse rhythm:   regular Resp:     16 per minute BP sitting:   126 / 84  (left arm) Cuff size:   large  Vitals Entered By: Rock Nephew CMA (November 06, 2009 10:08 AM)  Nutrition Counseling: Patient's BMI is greater than 25 and therefore counseled on weight management options.  O2 Flow:  Room air CC: follow-up visit 63mo, Hypertension Management Is Patient Diabetic? Yes Pain Assessment Patient in pain? no        Primary Care Provider:  Etta Grandchild MD  CC:  follow-up visit 63mo and Hypertension Management.  History of Present Illness: She returns for f/up and feels well with no complaints. She is scheduled for a seminar at [facility] next week for evaluation for bariatric surgery. She has been undergoing a cardiac work-up for SOB with mostly normal results thus far.  Hypertension History:      She denies headache, chest pain, palpitations, dyspnea with exertion, orthopnea, peripheral edema, visual symptoms, neurologic problems, syncope, and side effects from treatment.  She notes no problems with any antihypertensive medication side effects.        Positive major cardiovascular risk factors include diabetes, hyperlipidemia, and hypertension.  Negative major cardiovascular risk factors include female age less than 60 years old, negative family history for ischemic heart disease, and non-tobacco-user status.        Further assessment for target organ damage reveals no history of ASHD, cardiac end-organ damage (CHF/LVH), stroke/TIA, peripheral vascular disease, renal insufficiency, or hypertensive retinopathy.     Current Medications (verified): 1)  Lantus 100 Unit/ml Soln (Insulin Glargine) .... 40 Units At  Bedtime 2)  Toprol Xl 50 Mg Xr24h-Tab (Metoprolol Succinate) .... One By Mouth Daily 3)  Metformin Hcl 1000 Mg Tabs (Metformin Hcl) .... Take 1 Tablet By Mouth Once A Day 4)  Mirena 20 Mcg/24hr Iud (Levonorgestrel) .... Iud 5)  Truetrack Test  Strp (Glucose Blood) .... Test Up To Two Times A Day  Dx:250.00 6)  Lancets  Misc (Lancets) .... Test Up To Two Times A Day 7)  Onetouch Ultra Mini W/device Kit (Blood Glucose Monitoring Suppl) .... Use Once Daily As Directed 8)  Glucotrol Xl 10 Mg Xr24h-Tab (Glipizide) .... Take One Tablet By Mouth Once Daily.  Allergies (verified): No Known Drug Allergies  Past History:  Past Medical History: Reviewed history from 10/02/2009 and no changes required. Anemia-iron deficiency Diabetes mellitus, type II Hypertension PCOS  Past Surgical History: Reviewed history from 10/02/2009 and no changes required. Tonsillectomy  Family History: Reviewed history from 10/02/2009 and no changes required. Family History of Arthritis Family History Diabetes 1st degree relative Family History High cholesterol Family History Hypertension Family History of Prostate CA 1st degree relative <50  Social History: Reviewed history from 10/02/2009 and no changes required. 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   CV:  Complains of palpitations;  denies chest pain or discomfort, fainting, fatigue, lightheadness, near fainting, shortness of breath with exertion, swelling of feet, swelling of hands, and weight gain.  Physical Exam  General:  alert, well-developed, well-nourished, well-hydrated, appropriate dress, cooperative to examination, good hygiene, and overweight-appearing.   Head:  normocephalic, atraumatic, no  abnormalities observed, and no abnormalities palpated.   Mouth:  Oral mucosa and oropharynx without lesions or exudates.  Teeth in good repair. Neck:  supple, full ROM, no masses, no thyromegaly, no JVD, normal carotid upstroke, no carotid bruits, and no cervical lymphadenopathy.   Lungs:  normal respiratory effort, no intercostal retractions, no accessory muscle use, normal breath sounds, no dullness, no fremitus, no crackles, and no wheezes.   Heart:  Regular rhythm, no murmur, no gallop, no rub, and no JVD.  normal rate, regular rhythm, no murmur, no gallop, no rub, no JVD, and tachycardia.   Abdomen:  soft, non-tender, normal bowel sounds, no distention, no masses, no guarding, no rigidity, no rebound tenderness, no hepatomegaly, and no splenomegaly.   Msk:  normal ROM, no joint tenderness, no joint swelling, no joint warmth, no redness over joints, no joint deformities, no joint instability, and no crepitation.   Pulses:  R and L carotid,radial,femoral,dorsalis pedis and posterior tibial pulses are full and equal bilaterally Extremities:  No clubbing, cyanosis, edema, or deformity noted with normal full range of motion of all joints.   Neurologic:  No cranial nerve deficits noted. Station and gait are normal. Plantar reflexes are down-going bilaterally. DTRs are symmetrical throughout. Sensory, motor and coordinative functions appear intact. Skin:  Intact without suspicious lesions or rashes Cervical Nodes:  no anterior cervical adenopathy and no posterior cervical adenopathy.   Psych:  Cognition and judgment appear intact. Alert and cooperative with normal attention span and concentration. No apparent delusions, illusions, hallucinations  Diabetes Management Exam:    Foot Exam (with socks and/or shoes not present):       Sensory-Pinprick/Light touch:          Left medial foot (L-4): normal          Left dorsal foot (L-5): normal          Left lateral foot (S-1): normal          Right medial  foot (L-4): normal          Right dorsal foot (L-5): normal          Right lateral foot (S-1): normal       Sensory-Monofilament:          Left foot: normal          Right foot: normal       Inspection:          Left foot: normal          Right foot: normal       Nails:          Left foot: normal          Right foot: normal   Impression & Recommendations:  Problem # 1:  UNSPECIFIED TACHYCARDIA (ICD-785.0) Assessment Improved  Problem # 2:  HYPERTENSION (ICD-401.9) Assessment: Improved  Her updated medication list for this problem includes:    Toprol Xl 50 Mg Xr24h-tab (Metoprolol succinate) ..... One by mouth daily  BP today: 126/84 Prior BP: 114/80 (10/18/2009)  10 Yr Risk Heart Disease: 1 % Prior 10 Yr Risk Heart Disease: Not enough information (10/02/2009)  Labs Reviewed: K+: 3.9 (10/02/2009) Creat: : 0.8 (10/02/2009)  Chol: 174 (10/02/2009)   HDL: 49.70 (10/02/2009)   LDL: 101 (10/02/2009)   TG: 119.0 (10/02/2009)  Problem # 3:  OBESITY, UNSPECIFIED (ICD-278.00) Assessment: Unchanged  Ht: 69 (11/06/2009)   Wt: 284 (11/06/2009)   BMI: 42.09 (11/06/2009)  Problem # 4:  ANEMIA-IRON DEFICIENCY (ICD-280.9) Assessment: Improved  Hgb: 12.2 (10/02/2009)   Hct: 35.9 (10/02/2009)   Platelets: 227.0 (10/02/2009) RBC: 4.30 (10/02/2009)   RDW: 13.1 (10/02/2009)   WBC: 7.1 (10/02/2009) MCV: 83.5 (10/02/2009)   MCHC: 33.9 (10/02/2009) TSH: 1.37 (10/02/2009)  Problem # 5:  DIABETES MELLITUS, TYPE II (ICD-250.00) Assessment: Improved  Her updated medication list for this problem includes:    Lantus 100 Unit/ml Soln (Insulin glargine) .Marland KitchenMarland KitchenMarland KitchenMarland Kitchen 40 units at bedtime    Metformin Hcl 1000 Mg Tabs (Metformin hcl) .Marland Kitchen... Take 1 tablet by mouth once a day    Glucotrol Xl 10 Mg Xr24h-tab (Glipizide) .Marland Kitchen... Take one tablet by mouth once daily.  Labs Reviewed: Creat: 0.8 (10/02/2009)     Last Eye Exam: normal (09/10/2009) Reviewed HgBA1c results: 7.1 (10/02/2009)  Complete  Medication List: 1)  Lantus 100 Unit/ml Soln (Insulin glargine) .... 40 units at bedtime 2)  Toprol Xl 50 Mg Xr24h-tab (Metoprolol succinate) .... One by mouth daily 3)  Metformin Hcl 1000 Mg Tabs (Metformin hcl) .... Take 1 tablet by mouth once a day 4)  Mirena 20 Mcg/24hr Iud (Levonorgestrel) .... Iud 5)  Truetrack Test Strp (Glucose blood) .... Test up to two times a day  dx:250.00 6)  Lancets Misc (Lancets) .... Test up to two times a day 7)  Onetouch Ultra Mini W/device Kit (Blood glucose monitoring suppl) .... Use once daily as directed 8)  Glucotrol Xl 10 Mg Xr24h-tab (Glipizide) .... Take one tablet by mouth once daily.  Hypertension Assessment/Plan:      The patient's hypertensive risk group is category C: Target organ damage and/or diabetes.  Her calculated 10 year risk of coronary heart disease is 1 %.  Today's blood pressure is 126/84.  Her blood pressure goal is < 130/80.  Patient Instructions: 1)  Please schedule a follow-up appointment in 2 months. 2)  It is important that you exercise regularly at least 20 minutes 5 times a week. If you develop chest pain, have severe difficulty breathing, or feel very tired , stop exercising immediately and seek medical attention. 3)  You need to lose weight. Consider a lower calorie diet and regular exercise.  4)  Check your Blood Pressure regularly. If it is above 140/90: you should make an appointment.

## 2010-10-08 NOTE — Letter (Signed)
Summary: LMN for weight loss surgery/[facility]  LMN for weight loss surgery/ [facility]   Imported By: Sherian Rein 11/22/2009 08:26:23  _____________________________________________________________________  External Attachment:    Type:   Image     Comment:   External Document

## 2010-10-08 NOTE — Assessment & Plan Note (Signed)
Summary: nep/tachycardia/jml   Primary Provider:  Etta Grandchild MD   History of Present Illness: [name] is referred today for evaluation of palpitations associated with sob.  The patient has a h/o obesity, DM, and HTN.  She had difficulty losing weight and was also diagnosed with Polycystic Ovary syndrome.  Despite attempts at diet and exercise, she continues to gain weight.  She was in her usual state of health until 02/08 when she experienced sudden sob and palpitations.  She ultimately presented to the ED where her symptoms had resolved.  We do not have an ECG documenting any tachycardia.  She denies syncope.  She did experience chest discomfort.  Current Medications (verified): 1)  Lantus 100 Unit/ml Soln (Insulin Glargine) .... 40 Units At Bedtime 2)  Toprol Xl 25 Mg Xr24h-Tab (Metoprolol Succinate) .... Take 1 Tablet By Mouth Once A Day 3)  Metformin Hcl 1000 Mg Tabs (Metformin Hcl) .... Take 1 Tablet By Mouth Once A Day 4)  Mirena 20 Mcg/24hr Iud (Levonorgestrel) .... Iud 5)  Truetrack Test  Strp (Glucose Blood) .... Test Up To Two Times A Day  Dx:250.00 6)  Lancets  Misc (Lancets) .... Test Up To Two Times A Day 7)  Onetouch Ultra Mini W/device Kit (Blood Glucose Monitoring Suppl) .... Use Once Daily As Directed 8)  Glucotrol Xl 10 Mg Xr24h-Tab (Glipizide) .... Take One Tablet By Mouth Once Daily.  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  Vital Signs:  Patient profile:   25 year old female Menstrual status:  regular Height:      69 inches Weight:      284 pounds BMI:     42.09 Pulse rate:   92 / minute BP sitting:   114 / 80  (left arm)  Vitals Entered By: Laurance Flatten CMA (October 18, 2009 11:40 AM)  Physical Exam  General:  alert, well-developed, well-nourished, well-hydrated, appropriate dress, cooperative to examination, good hygiene, and overweight-appearing.   Head:  normocephalic, atraumatic, no abnormalities observed, and no abnormalities palpated. Moderate hirsutism is present.  Eyes:  vision grossly intact, pupils equal, pupils round, and pupils reactive to light.   Mouth:  Oral mucosa and oropharynx without lesions or exudates.  Teeth in good repair. Neck:  supple, full ROM, no masses, no thyromegaly, no JVD, normal carotid upstroke, no carotid bruits, and no cervical lymphadenopathy.   Lungs:  normal respiratory effort, no intercostal retractions, no accessory muscle use, normal breath sounds, no dullness, no fremitus, no crackles, and no wheezes.   Heart:  Regular rhythm, no murmur, no gallop, no rub, and no JVD.  normal rate, regular rhythm, no murmur, no gallop, no rub, no JVD, and tachycardia.   Abdomen:  soft, non-tender, normal bowel sounds, no distention, no masses, no guarding, no rigidity, no rebound tenderness, no hepatomegaly, and no splenomegaly.  Obese. Msk:  normal ROM, no joint tenderness, no joint swelling, no joint warmth, no redness  over joints, no joint deformities, no joint instability, and no crepitation.   Pulses:  R and L carotid,radial,femoral,dorsalis pedis and posterior tibial pulses are full and equal bilaterally Extremities:  No clubbing, cyanosis, edema, or deformity noted with normal full range of motion of all joints.   Neurologic:  No cranial nerve  deficits noted. Station and gait are normal. Plantar reflexes are down-going bilaterally. DTRs are symmetrical throughout. Sensory, motor and coordinative functions appear intact.   Impression & Recommendations:  Problem # 1:  UNSPECIFIED TACHYCARDIA (ICD-785.0) The etiology of her symptoms is unclear.  I suspect sinus tachycardia rather than SVT/VT.  I will ask that she obtain a 2D echo to r/o structural heart disease. I will hold off on having her wear a cardionet monitor as she may not have symptoms any time soon.  However, if she were to experience more palpitations, then I would ask her to wear a cardionet monitor.  Also, will have her increase her beta blocker for blood pressure and palpitation control. Orders: Echocardiogram (Echo)  Problem # 2:  HYPERTENSION (ICD-401.9) A low sodium diet is recommended. Toprol has been increased. Her updated medication list for this problem includes:    Toprol Xl 50 Mg Xr24h-tab (Metoprolol succinate) ..... One by mouth daily  Orders: Echocardiogram (Echo)  Problem # 3:  OBESITY, UNSPECIFIED (ICD-278.00) She is considering bariatric surgery to which I think she would be a good candidate.  Patient Instructions: 1)  Your physician has recommended you make the following change in your medication: Increase your Toprol to 50mg  daily 2)  Your physician has requested that you have an echocardiogram.  Echocardiography is a painless test that uses sound waves to create images of your heart. It provides your doctor with information about the size and shape of your heart and how well your heart's chambers and valves are working.  This procedure takes approximately one hour. There are no restrictions for this procedure. 3)  If herat reates continue to be elevated will order a monitor Prescriptions: TOPROL XL 50 MG XR24H-TAB (METOPROLOL SUCCINATE) one by mouth daily  #30 x 11   Entered by:   Dennis Bast, RN, BSN   Authorized by:   Laren Boom, MD,  [facility]   Signed by:   Dennis Bast, RN, BSN on 10/18/2009   Method used:   Electronically to        [facility] (retail)       [address]       [place], [place]  [address]       Ph: [phone]       Fax: [phone]   RxID:   [id number]

## 2010-10-08 NOTE — Progress Notes (Signed)
Summary: rx request  Phone Note Call from Patient Call back at Home Phone [phone]   Summary of Call: Patient left message on triage that she is out of insulin and needs her rxs (insulin and metformin) sent to her pharmacy. Is this ok and how many refills allowed? Initial call taken by: Lucious Groves,  October 03, 2009 9:13 AM  Follow-up for Phone Call        yes, RF 11 times Follow-up by: Etta Grandchild MD,  October 03, 2009 9:17 AM  Additional Follow-up for Phone Call Additional follow up Details #1::        prescriptions sent, and patient notified. Additional Follow-up by: Lucious Groves,  October 03, 2009 10:20 AM    Prescriptions: METFORMIN HCL 1000 MG TABS (METFORMIN HCL) Take 1 tablet by mouth once a day  #30 x 11   Entered by:   Lucious Groves   Authorized by:   Etta Grandchild MD   Signed by:   Lucious Groves on 10/03/2009   Method used:   Electronically to        [facility]       [address]       [place], [place]  [address]       Ph: [phone]       Fax: [phone]   RxID:   [id number] LANTUS 100 UNIT/ML SOLN (INSULIN GLARGINE) 40 units at bedtime  #30 day suppl x 11   Entered by:   Lucious Groves   Authorized by:   Etta Grandchild MD   Signed by:   Lucious Groves on 10/03/2009   Method used:   Electronically to        [facility])       [address]       [place], [place]  [address]       Ph: [phone]       Fax: [phone]   RxID:   [id number]

## 2010-10-10 NOTE — Assessment & Plan Note (Signed)
Summary: follow up-lb   Vital Signs:  Patient profile:   25 year old female Menstrual status:  regular LMP:     07/12/2010 Height:      69 inches Weight:      271 pounds BMI:     40.16 O2 Sat:      98 % on Room air Temp:     98.7 degrees F oral Pulse rate:   98 / minute Pulse rhythm:   regular Resp:     16 per minute BP sitting:   128 / 88  (left arm) Cuff size:   large  Vitals Entered By: Bill Salinas CMA (August 27, 2010 8:41 AM)  Nutrition Counseling: Patient's BMI is greater than 25 and therefore counseled on weight management options.  O2 Flow:  Room air  Primary Care Provider:  Etta Grandchild MD   History of Present Illness:  Follow-Up Visit      This is a 25 year old woman who presents for Follow-up visit.  The patient denies chest pain, palpitations, dizziness, syncope, low blood sugar symptoms, high blood sugar symptoms, edema, SOB, DOE, PND, and orthopnea.  Since the last visit the patient notes no new problems or concerns, a recent ED visit, and a recent hospitilization.  The patient reports taking meds as prescribed, not monitoring BP, not monitoring blood sugars, and dietary noncompliance.  When questioned about possible medication side effects, the patient notes none.    Preventive Screening-Counseling & Management  Alcohol-Tobacco     Alcohol drinks/day: <1     Alcohol type: wine     >5/day in last 3 mos: no     Alcohol Counseling: not indicated; use of alcohol is not excessive or problematic     Feels need to cut down: no     Feels annoyed by complaints: no     Feels guilty re: drinking: no     Needs 'eye opener' in am: no     Smoking Status: never     Tobacco Counseling: not indicated; no tobacco use  Hep-HIV-STD-Contraception     Hepatitis Risk: risk noted     HIV Risk: no risk noted     STD Risk: no risk noted      Sexual History:  currently monogamous.        Drug Use:  no.        Blood Transfusions:  no.    Clinical Review  Panels:  Lipid Management   Cholesterol:  174 (10/02/2009)   LDL (bad choesterol):  101 (10/02/2009)   HDL (good cholesterol):  49.70 (10/02/2009)  Diabetes Management   HgBA1C:  10.4 (03/04/2010)   Creatinine:  0.8 (03/04/2010)   Last Dilated Eye Exam:  normal (09/10/2009)   Last Foot Exam:  yes (08/27/2010)  CBC   WBC:  7.1 (10/02/2009)   RBC:  4.30 (10/02/2009)   Hgb:  12.2 (10/02/2009)   Hct:  35.9 (10/02/2009)   Platelets:  227.0 (10/02/2009)   MCV  83.5 (10/02/2009)   MCHC  33.9 (10/02/2009)   RDW  13.1 (10/02/2009)   PMN:  48.7 (10/02/2009)   Lymphs:  43.4 (10/02/2009)   Monos:  6.2 (10/02/2009)   Eosinophils:  1.4 (10/02/2009)   Basophil:  0.3 (10/02/2009)  Complete Metabolic Panel   Glucose:  309 (03/04/2010)   Sodium:  137 (03/04/2010)   Potassium:  4.2 (03/04/2010)   Chloride:  101 (03/04/2010)   CO2:  28 (03/04/2010)   BUN:  9 (03/04/2010)   Creatinine:  0.8 (03/04/2010)   Albumin:  3.9 (10/02/2009)   Total Protein:  7.9 (10/02/2009)   Calcium:  9.8 (03/04/2010)   Total Bili:  0.4 (10/02/2009)   Alk Phos:  64 (10/02/2009)   SGPT (ALT):  23 (10/02/2009)   SGOT (AST):  18 (10/02/2009)   Medications Prior to Update: 1)  Lantus 100 Unit/ml Soln (Insulin Glargine) .... 80 Units At Bedtime 2)  Toprol Xl 50 Mg Xr24h-Tab (Metoprolol Succinate) .... One By Mouth Daily 3)  Mirena 20 Mcg/24hr Iud (Levonorgestrel) .... Iud 4)  Truetrack Test  Strp (Glucose Blood) .... Test Up To Two Times A Day  Dx:250.00 5)  Lancets  Misc (Lancets) .... Test Up To Two Times A Day 6)  Onetouch Ultra Mini W/device Kit (Blood Glucose Monitoring Suppl) .... Use Once Daily As Directed 7)  Glucotrol Xl 10 Mg Xr24h-Tab (Glipizide) .... Take One Tablet By Mouth Once Daily. 8)  Actos 15 Mg Tabs (Pioglitazone Hcl) .... One By Mouth Once Daily 9)  Janumet 50-500 Mg Tabs (Sitagliptin-Metformin Hcl) .... One By Mouth Two Times A Day 10)  Novolog Penfill 100 Unit/ml Soln (Insulin Aspart)  .Mar[facility] Kitchen.. 11  Units Three Times A Day With Meals  Current Medications (verified): 1)  Lantus 100 Unit/ml Soln (Insulin Glargine) .... 80 Units At Bedtime 2)  Truetrack Test  Strp (Glucose Blood) .... Test Up To Two Times A Day  Dx:250.00 3)  Lancets  Misc (Lancets) .... Test Up To Two Times A Day 4)  Onetouch Ultra Mini W/device Kit (Blood Glucose Monitoring Suppl) .... Use Once Daily As Directed 5)  Glucotrol Xl 10 Mg Xr24h-Tab (Glipizide) .... Take One Tablet By Mouth Once Daily. 6)  Novolog Penfill 100 Unit/ml Soln (Insulin Aspart) .Marland Kitchen.. 11  Units Three Times A Day With Meals 7)  Metformin Hcl 1000 Mg Tabs (Metformin Hcl) .... 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 Family History of Arthritis Family History Diabetes 1st degree relative Family History High cholesterol Family History Hypertension Family History of Prostate CA 1st degree relative <50  Social History: Reviewed history from 10/02/2009 and no changes required. 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   General:  Complains of malaise; denies chills, fatigue, fever, loss of appetite, sleep disorder, sweats, weakness, and weight loss. Endo:  Denies cold intolerance, excessive hunger, excessive thirst, excessive urination, heat intolerance, polyuria, and weight change. Heme:  Denies abnormal bruising, bleeding, enlarge lymph nodes, fevers, pallor, and skin discoloration.  Physical Exam  General:  alert, well-developed, well-nourished, well-hydrated, and overweight-appearing.   Head:  normocephalic  and atraumatic.   Eyes:  vision grossly intact, pupils equal, and no injection.   Ears:  R ear normal and L ear normal.   Nose:  External nasal examination shows no deformity or inflammation. Nasal mucosa are pink and moist without lesions or exudates. Mouth:  Oral mucosa and oropharynx without lesions or exudates.  Teeth in good repair. Neck:  supple, full ROM, no masses, no thyromegaly, no thyroid nodules or tenderness, no JVD, normal carotid upstroke, no carotid bruits, no cervical lymphadenopathy, and no neck tenderness.   Lungs:  normal respiratory effort, no intercostal retractions, no accessory muscle use, normal breath sounds, no dullness, no fremitus, no crackles, and no wheezes.   Heart:  normal rate, regular rhythm, no murmur, no gallop, no rub, and no JVD.   Abdomen:  soft, non-tender, normal bowel sounds, no distention, no masses, no guarding, no rigidity, no rebound tenderness, no abdominal hernia, no inguinal hernia, no hepatomegaly, and no splenomegaly.   Msk:  No deformity or scoliosis noted of thoracic or lumbar spine.   Pulses:  R and L carotid,radial,femoral,dorsalis pedis and posterior tibial pulses are full and equal  bilaterally Extremities:  No clubbing, cyanosis, edema, or deformity noted with normal full range of motion of all joints.   Neurologic:  No cranial nerve deficits noted. Station and gait are normal. Plantar reflexes are down-going bilaterally. DTRs are symmetrical throughout. Sensory, motor and coordinative functions appear intact. Skin:  Intact without suspicious lesions or rashes Cervical Nodes:  No lymphadenopathy noted Psych:  Cognition and judgment appear intact. Alert and cooperative with normal attention span and concentration. No apparent delusions, illusions, hallucinations  Diabetes Management Exam:    Foot Exam (with socks and/or shoes not present):       Sensory-Pinprick/Light touch:          Left medial foot (L-4): normal          Left dorsal foot (L-5): normal          Left lateral foot (S-1): normal          Right medial foot (L-4): normal          Right dorsal foot (L-5): normal          Right lateral foot (S-1): normal       Sensory-Monofilament:          Left foot: normal          Right foot: normal       Inspection:          Left foot: normal          Right foot: normal       Nails:          Left foot: normal          Right foot: normal   Impression & Recommendations:  Problem # 1:  DELAYED MENSES (ICD-626.8) Assessment New  Orders: Venipuncture (11914) TLB-BMP (Basic Metabolic Panel-BMET) (80048-METABOL) TLB-CBC Platelet - w/Differential (85025-CBCD) TLB-Hepatic/Liver Function Pnl (80076-HEPATIC) TLB-TSH (Thyroid Stimulating Hormone) (84443-TSH) TLB-B12 + Folate Pnl (78295_62130-Q65/HQI) TLB-IBC Pnl (Iron/FE;Transferrin) (83550-IBC) TLB-Preg Serum Quant (B-hCG) (84702-HCG-QN) TLB-A1C / Hgb A1C (Glycohemoglobin) (83036-A1C) TLB-Udip w/ Micro (81001-URINE)  Problem # 2:  HYPERTENSION (ICD-401.9) Assessment: Improved  The following medications were removed from the medication list:    Toprol Xl 50 Mg Xr24h-tab (Metoprolol succinate) ..... One by mouth  daily  Orders: Venipuncture (69629) TLB-BMP (Basic Metabolic Panel-BMET) (80048-METABOL) TLB-CBC Platelet - w/Differential (85025-CBCD) TLB-Hepatic/Liver Function Pnl (80076-HEPATIC) TLB-TSH (Thyroid Stimulating Hormone) (  84443-TSH) TLB-B12 + Folate Pnl (82746_82607-B12/FOL) TLB-IBC Pnl (Iron/FE;Transferrin) (83550-IBC) TLB-Preg Serum Quant (B-hCG) (84702-HCG-QN) TLB-A1C / Hgb A1C (Glycohemoglobin) (83036-A1C) TLB-Udip w/ Micro (81001-URINE)  BP today: 128/88 Prior BP: 138/88 (03/04/2010)  Prior 10 Yr Risk Heart Disease: 1 % (11/06/2009)  Labs Reviewed: K+: 4.2 (03/04/2010) Creat: : 0.8 (03/04/2010)   Chol: 174 (10/02/2009)   HDL: 49.70 (10/02/2009)   LDL: 101 (10/02/2009)   TG: 119.0 (10/02/2009)  Problem # 3:  DIABETES MELLITUS, TYPE II (ICD-250.00) Assessment: Unchanged  The following medications were removed from the medication list:    Actos 15 Mg Tabs (Pioglitazone hcl) ..... One by mouth once daily    Janumet 50-500 Mg Tabs (Sitagliptin-metformin hcl) ..... One by mouth two times a day Her updated medication list for this problem includes:    Lantus 100 Unit/ml Soln (Insulin glargine) .[facility] 80 units at bedtime    Glucotrol Xl 10 Mg Xr24h-tab (Glipizide) .Marland Kitchen... Take one tablet by mouth once daily.    Novolog Penfill 100 Unit/ml Soln (Insulin aspart) .Marland Kitchen... 11  units three times a day with meals    Metformin Hcl 1000 Mg Tabs (Metformin hcl) ..... One by mouth two times a day  Orders: Venipuncture (16109) TLB-BMP (Basic Metabolic Panel-BMET) (80048-METABOL) TLB-CBC Platelet - w/Differential (85025-CBCD) TLB-Hepatic/Liver Function Pnl (80076-HEPATIC) TLB-TSH (Thyroid Stimulating Hormone) (84443-TSH) TLB-B12 + Folate Pnl (60454_09811-B14/NWG) TLB-IBC Pnl (Iron/FE;Transferrin) (83550-IBC) TLB-Preg Serum Quant (B-hCG) (84702-HCG-QN) TLB-A1C / Hgb A1C (Glycohemoglobin) (83036-A1C) TLB-Udip w/ Micro (81001-URINE)  Labs Reviewed: Creat: 0.8 (03/04/2010)     Last Eye Exam:  normal (09/10/2009) Reviewed HgBA1c results: 10.4 (03/04/2010)  7.1 (10/02/2009)  Problem # 4:  ANEMIA-IRON DEFICIENCY (ICD-280.9) Assessment: Unchanged  Orders: Venipuncture (95621) TLB-BMP (Basic Metabolic Panel-BMET) (80048-METABOL) TLB-CBC Platelet - w/Differential (85025-CBCD) TLB-Hepatic/Liver Function Pnl (80076-HEPATIC) TLB-TSH (Thyroid Stimulating Hormone) (84443-TSH) TLB-B12 + Folate Pnl (30865_78469-G29/BMW) TLB-IBC Pnl (Iron/FE;Transferrin) (83550-IBC) TLB-Preg Serum Quant (B-hCG) (84702-HCG-QN) TLB-A1C / Hgb A1C (Glycohemoglobin) (83036-A1C) TLB-Udip w/ Micro (81001-URINE)  Hgb: 12.2 (10/02/2009)   Hct: 35.9 (10/02/2009)   Platelets: 227.0 (10/02/2009) RBC: 4.30 (10/02/2009)   RDW: 13.1 (10/02/2009)   WBC: 7.1 (10/02/2009) MCV: 83.5 (10/02/2009)   MCHC: 33.9 (10/02/2009) TSH: 1.37 (10/02/2009)  Complete Medication List: 1)  Lantus 100 Unit/ml Soln (Insulin glargine) .... 80 units at bedtime 2)  Truetrack Test Strp (Glucose blood) .... Test up to two times a day  dx:250.00 3)  Lancets Misc (Lancets) .... Test up to two times a day 4)  Onetouch Ultra Mini W/device Kit (Blood glucose monitoring suppl) .... Use once daily as directed 5)  Glucotrol Xl 10 Mg Xr24h-tab (Glipizide) .... Take one tablet by mouth once daily. 6)  Novolog Penfill 100 Unit/ml Soln (Insulin aspart) .Marland Kitchen.. 11  units three times a day with meals 7)  Metformin Hcl 1000 Mg Tabs (Metformin hcl) .... One by mouth two times a day  Patient Instructions: 1)  Please schedule a follow-up appointment in 2 months. 2)  It is important that you exercise regularly at least 20 minutes 5 times a week. If you develop chest pain, have severe difficulty breathing, or feel very tired , stop exercising immediately and seek medical attention. 3)  You need to lose weight. Consider a lower calorie diet and regular exercise.  4)  If you could be exposed to sexually transmitted diseases, you should use a condom. 5)  If you  are having sex and you or your partner don't want a child, use contraception. 6)  Check your blood sugars regularly. If your readings are usually  above 200 or below 70 you should contact our office. 7)  It is important that your Diabetic A1c level is checked every 3 months. 8)  See your eye doctor yearly to check for diabetic eye damage. 9)  Check your feet each night for sore areas, calluses or signs of infection. 10)  Check your Blood Pressure regularly. If it is above 130/80: you should make an appointment.   Orders Added: 1)  Venipuncture [36415] 2)  TLB-BMP (Basic Metabolic Panel-BMET) [80048-METABOL] 3)  TLB-CBC Platelet - w/Differential [85025-CBCD] 4)  TLB-Hepatic/Liver Function Pnl [80076-HEPATIC] 5)  TLB-TSH (Thyroid Stimulating Hormone) [84443-TSH] 6)  TLB-B12 + Folate Pnl [82746_82607-B12/FOL] 7)  TLB-IBC Pnl (Iron/FE;Transferrin) [83550-IBC] 8)  TLB-Preg Serum Quant (B-hCG) [84702-HCG-QN] 9)  TLB-A1C / Hgb A1C (Glycohemoglobin) [83036-A1C] 10)  TLB-Udip w/ Micro [81001-URINE] 11)  Est. Patient Level V [04540]

## 2010-11-07 ENCOUNTER — Ambulatory Visit: Payer: Self-pay

## 2010-11-14 ENCOUNTER — Encounter: Payer: 59 | Attending: General Surgery

## 2010-11-14 DIAGNOSIS — Z01818 Encounter for other preprocedural examination: Secondary | ICD-10-CM | POA: Insufficient documentation

## 2010-11-14 DIAGNOSIS — Z713 Dietary counseling and surveillance: Secondary | ICD-10-CM | POA: Insufficient documentation

## 2010-11-22 ENCOUNTER — Other Ambulatory Visit: Payer: Self-pay | Admitting: General Surgery

## 2010-11-22 ENCOUNTER — Encounter (HOSPITAL_COMMUNITY): Payer: 59 | Attending: General Surgery

## 2010-11-22 DIAGNOSIS — Z01812 Encounter for preprocedural laboratory examination: Secondary | ICD-10-CM | POA: Insufficient documentation

## 2010-11-22 LAB — URINALYSIS, ROUTINE W REFLEX MICROSCOPIC
Bilirubin Urine: NEGATIVE
Glucose, UA: >1000 mg/dL — AB
Hgb urine dipstick: NEGATIVE
Specific Gravity, Urine: 1.035 — ABNORMAL HIGH (ref 1.005–1.030)
pH: 5 (ref 5.0–8.0)

## 2010-11-22 LAB — KETONES, QUALITATIVE: Acetone, Bld: NEGATIVE

## 2010-11-22 LAB — BLOOD GAS, ARTERIAL
Bicarbonate: 20 mEq/L (ref 20.0–24.0)
FIO2: 0.21 %
O2 Saturation: 97.6 %
Patient temperature: 98.6
TCO2: 18.2 mmol/L (ref 0–100)
pH, Arterial: 7.393 (ref 7.350–7.400)

## 2010-11-22 LAB — DIFFERENTIAL
Basophils Absolute: 0 10*3/uL (ref 0.0–0.1)
Basophils Absolute: 0 10*3/uL (ref 0.0–0.1)
Eosinophils Relative: 0 % (ref 0–5)
Eosinophils Relative: 1 % (ref 0–5)
Lymphocytes Relative: 41 % (ref 12–46)
Lymphocytes Relative: 50 % — ABNORMAL HIGH (ref 12–46)
Lymphocytes Relative: 43 % (ref 12–46)
Lymphs Abs: 2.8 10*3/uL (ref 0.7–4.0)
Monocytes Absolute: 0.7 10*3/uL (ref 0.1–1.0)
Monocytes Absolute: 0.7 10*3/uL (ref 0.1–1.0)
Monocytes Absolute: 0.5 10*3/uL (ref 0.1–1.0)
Monocytes Relative: 7 % (ref 3–12)
Neutro Abs: 2.1 10*3/uL (ref 1.7–7.7)
Neutro Abs: 3.3 10*3/uL (ref 1.7–7.7)
Neutro Abs: 3.7 10*3/uL (ref 1.7–7.7)
Neutrophils Relative %: 37 % — ABNORMAL LOW (ref 43–77)

## 2010-11-22 LAB — BASIC METABOLIC PANEL
BUN: 6 mg/dL (ref 6–23)
Calcium: 8.4 mg/dL (ref 8.4–10.5)
Calcium: 8.9 mg/dL (ref 8.4–10.5)
Chloride: 97 mEq/L (ref 96–112)
GFR calc Af Amer: >60 mL/min (ref >60)
GFR calc non Af Amer: >60 mL/min (ref >60)
GFR calc non Af Amer: >60 mL/min (ref >60)
GFR calc non Af Amer: >60 mL/min (ref >60)
Potassium: 3.3 mEq/L — ABNORMAL LOW (ref 3.5–5.1)
Potassium: 3.9 mEq/L (ref 3.5–5.1)
Potassium: 4.2 mEq/L (ref 3.5–5.1)
Sodium: 129 mEq/L — ABNORMAL LOW (ref 135–145)
Sodium: 137 mEq/L (ref 135–145)
Sodium: 138 mEq/L (ref 135–145)

## 2010-11-22 LAB — CBC
HCT: 36 % (ref 36.0–46.0)
HCT: 36.7 % (ref 36.0–46.0)
HCT: 39.8 % (ref 36.0–46.0)
HCT: 38.9 % (ref 36.0–46.0)
Hemoglobin: 12.1 g/dL (ref 12.0–15.0)
Hemoglobin: 12.1 g/dL (ref 12.0–15.0)
Hemoglobin: 12.4 g/dL (ref 12.0–15.0)
MCHC: 33.2 g/dL (ref 30.0–36.0)
MCV: 84.9 fL (ref 78.0–100.0)
MCV: 84.9 fL (ref 78.0–100.0)
RBC: 4.69 MIL/uL (ref 3.87–5.11)
RDW: 13.4 % (ref 11.5–15.5)
RDW: 13.5 % (ref 11.5–15.5)
RDW: 13.7 % (ref 11.5–15.5)
RDW: 13.6 % (ref 11.5–15.5)
WBC: 5.5 10*3/uL (ref 4.0–10.5)
WBC: 5.7 10*3/uL (ref 4.0–10.5)
WBC: 6.9 10*3/uL (ref 4.0–10.5)
WBC: 7.5 10*3/uL (ref 4.0–10.5)

## 2010-11-22 LAB — GLUCOSE, CAPILLARY
Glucose-Capillary: 205 mg/dL — ABNORMAL HIGH (ref 70–99)
Glucose-Capillary: 207 mg/dL — ABNORMAL HIGH (ref 70–99)
Glucose-Capillary: 232 mg/dL — ABNORMAL HIGH (ref 70–99)
Glucose-Capillary: 234 mg/dL — ABNORMAL HIGH (ref 70–99)
Glucose-Capillary: 277 mg/dL — ABNORMAL HIGH (ref 70–99)
Glucose-Capillary: 317 mg/dL — ABNORMAL HIGH (ref 70–99)
Glucose-Capillary: 397 mg/dL — ABNORMAL HIGH (ref 70–99)
Glucose-Capillary: >600 mg/dL (ref 70–99)
Glucose-Capillary: >600 mg/dL (ref 70–99)

## 2010-11-22 LAB — COMPREHENSIVE METABOLIC PANEL
ALT: 33 U/L (ref 0–35)
Alkaline Phosphatase: 65 U/L (ref 39–117)
BUN: 11 mg/dL (ref 6–23)
CO2: 25 mEq/L (ref 19–32)
Chloride: 103 mEq/L (ref 96–112)
GFR calc non Af Amer: >60 mL/min (ref >60)
Glucose, Bld: 265 mg/dL — ABNORMAL HIGH (ref 70–99)
Potassium: 4.3 mEq/L (ref 3.5–5.1)
Sodium: 135 mEq/L (ref 135–145)
Total Bilirubin: 0.6 mg/dL (ref 0.3–1.2)
Total Protein: 8.4 g/dL — ABNORMAL HIGH (ref 6.0–8.3)

## 2010-11-22 LAB — URINE MICROSCOPIC-ADD ON

## 2010-11-22 LAB — HEPATIC FUNCTION PANEL
ALT: 18 U/L (ref 0–35)
AST: 15 U/L (ref 0–37)
Albumin: 3.2 g/dL — ABNORMAL LOW (ref 3.5–5.2)
Bilirubin, Direct: 0.1 mg/dL (ref 0.0–0.3)

## 2010-11-22 LAB — HEMOGLOBIN A1C: Mean Plasma Glucose: 349 mg/dL — ABNORMAL HIGH (ref <117)

## 2010-11-22 LAB — SURGICAL PCR SCREEN
MRSA, PCR: NEGATIVE
Staphylococcus aureus: NEGATIVE

## 2010-11-22 LAB — TSH: TSH: 2.002 u[IU]/mL (ref 0.350–4.500)

## 2010-11-22 LAB — LIPID PANEL
Cholesterol: 128 mg/dL (ref 0–200)
HDL: 35 mg/dL — ABNORMAL LOW (ref >39)
LDL Cholesterol: 72 mg/dL (ref 0–99)
Triglycerides: 105 mg/dL (ref <150)

## 2010-11-25 LAB — GLUCOSE, CAPILLARY: Glucose-Capillary: 263 mg/dL — ABNORMAL HIGH (ref 70–99)

## 2010-11-25 LAB — POCT CARDIAC MARKERS
Myoglobin, poc: 92.7 ng/mL (ref 12–200)
Troponin i, poc: <0.05 ng/mL (ref 0.00–0.09)

## 2010-11-25 LAB — POCT I-STAT, CHEM 8
BUN: 17 mg/dL (ref 6–23)
Hemoglobin: 14.3 g/dL (ref 12.0–15.0)
Potassium: 4 mEq/L (ref 3.5–5.1)
Sodium: 138 mEq/L (ref 135–145)
TCO2: 27 mmol/L (ref 0–100)

## 2010-11-25 LAB — WET PREP, GENITAL

## 2010-11-25 LAB — GC/CHLAMYDIA PROBE AMP, GENITAL: Chlamydia, DNA Probe: NEGATIVE

## 2010-11-25 LAB — POCT PREGNANCY, URINE: Preg Test, Ur: NEGATIVE

## 2010-11-28 LAB — CBC
MCHC: 32.9 g/dL (ref 30.0–36.0)
MCV: 84.9 fL (ref 78.0–100.0)
Platelets: 255 10*3/uL (ref 150–400)
RBC: 4.46 MIL/uL (ref 3.87–5.11)
RDW: 13.6 % (ref 11.5–15.5)

## 2010-11-28 LAB — DIFFERENTIAL
Basophils Absolute: 0 10*3/uL (ref 0.0–0.1)
Basophils Relative: 0 % (ref 0–1)
Eosinophils Absolute: 0.1 10*3/uL (ref 0.0–0.7)
Monocytes Relative: 5 % (ref 3–12)
Neutrophils Relative %: 43 % (ref 43–77)

## 2010-11-28 LAB — URINALYSIS, ROUTINE W REFLEX MICROSCOPIC
Glucose, UA: NEGATIVE mg/dL
Nitrite: NEGATIVE
Specific Gravity, Urine: 1.026 (ref 1.005–1.030)
pH: 5.5 (ref 5.0–8.0)

## 2010-11-28 LAB — BASIC METABOLIC PANEL
BUN: 8 mg/dL (ref 6–23)
CO2: 26 mEq/L (ref 19–32)
Chloride: 106 mEq/L (ref 96–112)
Creatinine, Ser: 0.81 mg/dL (ref 0.4–1.2)
Glucose, Bld: 89 mg/dL (ref 70–99)

## 2010-11-28 LAB — RAPID URINE DRUG SCREEN, HOSP PERFORMED
Barbiturates: NOT DETECTED
Cocaine: NOT DETECTED
Opiates: NOT DETECTED

## 2010-11-28 LAB — POCT CARDIAC MARKERS

## 2010-11-28 LAB — POCT PREGNANCY, URINE: Preg Test, Ur: NEGATIVE

## 2010-12-02 ENCOUNTER — Inpatient Hospital Stay (HOSPITAL_COMMUNITY): Payer: 59

## 2010-12-02 ENCOUNTER — Inpatient Hospital Stay (HOSPITAL_COMMUNITY)
Admission: RE | Admit: 2010-12-02 | Discharge: 2010-12-04 | DRG: 621 | Disposition: A | Discharge status: Home / Self Care | Payer: 59 | Source: Ambulatory Visit | Attending: General Surgery | Admitting: General Surgery

## 2010-12-02 DIAGNOSIS — Z6841 Body Mass Index (BMI) 40.0 and over, adult: Secondary | ICD-10-CM

## 2010-12-02 DIAGNOSIS — Z01812 Encounter for preprocedural laboratory examination: Secondary | ICD-10-CM

## 2010-12-02 DIAGNOSIS — R072 Precordial pain: Secondary | ICD-10-CM | POA: Diagnosis not present

## 2010-12-02 DIAGNOSIS — E119 Type 2 diabetes mellitus without complications: Secondary | ICD-10-CM | POA: Diagnosis present

## 2010-12-02 DIAGNOSIS — I10 Essential (primary) hypertension: Secondary | ICD-10-CM | POA: Diagnosis present

## 2010-12-02 LAB — CBC
Hemoglobin: 11.8 g/dL — ABNORMAL LOW (ref 12.0–15.0)
MCH: 27.2 pg (ref 26.0–34.0)
MCV: 86.4 fL (ref 78.0–100.0)
Platelets: 196 10*3/uL (ref 150–400)
RBC: 4.34 MIL/uL (ref 3.87–5.11)

## 2010-12-02 LAB — GLUCOSE, CAPILLARY
Glucose-Capillary: 226 mg/dL — ABNORMAL HIGH (ref 70–99)
Glucose-Capillary: 217 mg/dL — ABNORMAL HIGH (ref 70–99)

## 2010-12-02 LAB — CARDIAC PANEL(CRET KIN+CKTOT+MB+TROPI): Relative Index: 0.7 (ref 0.0–2.5)

## 2010-12-03 ENCOUNTER — Inpatient Hospital Stay (HOSPITAL_COMMUNITY): Payer: 59

## 2010-12-03 ENCOUNTER — Encounter (HOSPITAL_COMMUNITY): Payer: Self-pay

## 2010-12-03 DIAGNOSIS — Z09 Encounter for follow-up examination after completed treatment for conditions other than malignant neoplasm: Secondary | ICD-10-CM

## 2010-12-03 LAB — DIFFERENTIAL
Basophils Absolute: 0 10*3/uL (ref 0.0–0.1)
Eosinophils Relative: 0 % (ref 0–5)
Lymphocytes Relative: 17 % (ref 12–46)
Neutrophils Relative %: 75 % (ref 43–77)

## 2010-12-03 LAB — CBC
HCT: 38.4 % (ref 36.0–46.0)
Platelets: 197 10*3/uL (ref 150–400)
RBC: 4.47 MIL/uL (ref 3.87–5.11)
RDW: 13.3 % (ref 11.5–15.5)
WBC: 10.2 10*3/uL (ref 4.0–10.5)

## 2010-12-03 LAB — GLUCOSE, CAPILLARY
Glucose-Capillary: 237 mg/dL — ABNORMAL HIGH (ref 70–99)
Glucose-Capillary: 159 mg/dL — ABNORMAL HIGH (ref 70–99)
Glucose-Capillary: 158 mg/dL — ABNORMAL HIGH (ref 70–99)
Glucose-Capillary: 137 mg/dL — ABNORMAL HIGH (ref 70–99)

## 2010-12-03 MED ORDER — IOHEXOL 300 MG/ML  SOLN
30.0000 mL | Freq: Once | INTRAMUSCULAR | Status: AC | PRN
  Administered 2010-12-03: 30 mL via ORAL

## 2010-12-03 MED ORDER — IOHEXOL 300 MG/ML  SOLN
100.0000 mL | Freq: Once | INTRAMUSCULAR | Status: AC | PRN
  Administered 2010-12-03: 100 mL via INTRAVENOUS

## 2010-12-03 NOTE — Op Note (Signed)
Anna Nguyen            ACCOUNT NO.:  [id number]  MEDICAL RECORD NO.:  [id number]           PATIENT TYPE:  I  LOCATION:  1533                         FACILITY:  [facility]  PHYSICIAN:  Sharlet Salina T. Ascher Schroepfer, M.D.DATE OF BIRTH:  [birth year]  DATE OF PROCEDURE:  12/02/2010 DATE OF DISCHARGE:                              OPERATIVE REPORT   PREOPERATIVE DIAGNOSES:  Morbid obesity.  POSTOPERATIVE DIAGNOSIS:  Morbid obesity.  SURGICAL PROCEDURES:  Laparoscopic Roux-en-Y gastric bypass.  SURGEON:  Lorne Skeens. Adeli Frost, M.D.  ASSISTANT:  Sandria Bales. Ezzard Standing, M.D.  ANESTHESIA:  General.  BRIEF HISTORY:  Anna Nguyen is a 25 year old female, who has essentially lifelong history of moderate obesity, but progressive obesity in recent years despite multiple efforts at nonsurgical weight loss.  She has developed insulin-dependent diabetes mellitus and has been hospitalized with diabetic ketoacidosis.  She had been unable to control her weight with conservative measures and feels diabetes is progressive.  After extensive preoperative discussion and workup detailed elsewhere, we have elected to proceed with laparoscopic Roux-en- Y gastric bypass for treatment of her morbid obesity and diabetes.  In addition, she is treated for hypertension, polycystic ovarian syndrome. She presents at a weight of 5 feet inches 285 pounds with a BMI of 43.2.  DESCRIPTION OF OPERATION:  The patient undergone mechanical bowel prep. She received preoperative IV antibiotics in the holding area.  She is brought to the operating room.  General orotracheal anesthesia was induced.  The abdomen was widely sterilely prepped and draped.  PAS was in place.  She had also received subcutaneous heparin preoperatively. Correct patient and procedure were verified.  Access was obtained with a 12 mm OptiVu trocar in the left upper quadrant without difficulty and pneumoperitoneum established.  There is no evidence of  trocar injury. Under direct vision, a 12 mm trocar was placed laterally in the right upper quadrant, another 12 trocar in the right upper quadrant and midclavicular line and an another 12 mm trocar just above the left of the umbilicus for the camera port.  Finally, a 5-mm trocar was placed in the left flank.  The omentum was elevated.  Transverse mesocolon was elevated.  Ligament of Treitz identified.  A 40 cm a fair limb was carefully measured and the small bowel was divided at this point with a single firing of the powered 60 mm white load echelon stapler.  The mesentery was divided the short distance further with harmonic scalpel. A Penrose drain was sutured to the end of the Roux limb for later identification.  A 100-cm Roux limb was then carefully measured.  At this point, a side-to-side anastomosis was created between the a fair limb near to the end of the side of the Roux limb with a single firing of the white load 45-mm stapler.  The staple line was intact without bleeding and with good blood supply.  The common enterotomy was closed from either end with running 3-0 Vicryl.  Mesenteric defect was exposed and was closed with a running 2-0 silk.  Suture staple lines were coated with Tisseel tissue sealant.  The patient was then placed in steep reverse Trendelenburg position  and through a 5-mm subxiphoid site, the [facility] retractor was placed.  The left lobe of the liver elevated with very good exposure of the hiatus and stomach.  Initially, the angle of His was dissected, mobilizing the fundus and down onto the left crus.  A 4 cm pouch was measured along the lesser curve and the peritoneum.  The edges of the stomach was incised and careful dissection was carried with the harmonic scalpel and blunt dissection.  The right angles to the lesser curve back toward the lesser sac.  The free lesser sac was entered under direct vision.  An initial firing of the 60 mm powered echelon gold  stapler was performed at right angles to the lesser curve about 4 cm across the stomach.  The tissue posterior to the pouch was then exposed and further dissected, mobilizing up to the previously dissected angle of His.  Two further firings of the powered echelon 60- mm load stapler were carried up to the angle of His and then a small additional piece of tissue divided with a 45 blue load stapler completely separating the gastric pouch from the remnant.  The staple line of the gastric remnant was oversewn with a running 2-0 silk suture for hemostasis.  The omentum had been divided with harmonic scalpel up towards the transverse colon for mobilization of the Roux limb.  The Roux limb was brought up in antecolic fashion with candy cane facing the patient's left and came up to the gastric pouch without any undue tension.  The gastrojejunostomy was then created with initial posterior row of running 2-0 Vicryl between the Roux limb and the staple line of the gastric pouch.  The Ewald tube was passed into the pouch and the enterotomies were made in the pouch and the Roux limb in preparation for anastomosis.  A 2 cm anastomosis was then created with a single firing of the linear blue load 45-mm stapler.  The staple line was intact, nonbleeding.  The common enterotomy was then closed from either end with a running 2-0 Vicryl.  The Ewald tube was then passed back down through the anastomosis without difficulty and an outer seromuscular suture of running 2-0 Vicryl was placed anteriorly.  The Vonita Moss defect was then exposed and the defect was closed suturing the transverse mesocolon over the edge of the Roux limb mesentery with the running silk.  Following this, Dr. Ezzard Standing went above for endoscopy and with the outlet of the gastric pouch clamped and the pouch tensely distended with air under saline irrigation.  There was an intermittent tiny area of bubbling, which on close inspection appeared to  be coming apparently from a suture hole at the left side of the gastrojejunostomy.  There was very minimal bubbling and was only intermittent despite continue tense insufflation. The pouch was then desufflated.  I then placed a 2-0 Vicryl U stitch between the Roux limb and the gastric pouch covering the area where it seemed the bubbles.  Dr. Ezzard Standing then again tensely insufflated the pouch with the outlet clamps under saline irrigation and we saw no further bubbling.  The pouch was desufflated.  The suture staple lines of the gastrojejunostomy repaired with Tisseel tissue sealant.  Under direct vision two On-Q catheters were placed on either side of the abdomen running from the costal margin well laterally down toward the pelvis. The abdomen was inspected for hemostasis and there was no bleeding, no evidence of trocar injury.  The base of the tractor was moved under  direct vision.  All CO2 was evacuated and trocars removed.  Skin incisions were closed with subcuticular Monocryl and Dermabond.  Sponge, needle and instrument counts were correct.  The patient taken to recovery in good condition.     Lorne Skeens. Muaaz Brau, M.D.     Tory Emerald  D:  12/02/2010  T:  12/02/2010  Job:  [id number]  Electronically Signed by Glenna Fellows M.D. on 12/03/2010 11:51:43 AM

## 2010-12-04 LAB — GLUCOSE, CAPILLARY
Glucose-Capillary: 117 mg/dL — ABNORMAL HIGH (ref 70–99)
Glucose-Capillary: 148 mg/dL — ABNORMAL HIGH (ref 70–99)
Glucose-Capillary: 140 mg/dL — ABNORMAL HIGH (ref 70–99)

## 2010-12-04 LAB — DIFFERENTIAL
Basophils Absolute: 0 10*3/uL (ref 0.0–0.1)
Basophils Relative: 0 % (ref 0–1)
Eosinophils Absolute: 0.1 10*3/uL (ref 0.0–0.7)
Eosinophils Relative: 1 % (ref 0–5)
Monocytes Absolute: 0.8 10*3/uL (ref 0.1–1.0)

## 2010-12-04 LAB — CBC
MCHC: 30.7 g/dL (ref 30.0–36.0)
Platelets: 180 10*3/uL (ref 150–400)
RDW: 13.4 % (ref 11.5–15.5)

## 2010-12-17 ENCOUNTER — Encounter: Payer: 59 | Attending: General Surgery

## 2010-12-17 DIAGNOSIS — Z01818 Encounter for other preprocedural examination: Secondary | ICD-10-CM | POA: Insufficient documentation

## 2010-12-17 DIAGNOSIS — Z713 Dietary counseling and surveillance: Secondary | ICD-10-CM | POA: Insufficient documentation

## 2010-12-18 ENCOUNTER — Encounter: Payer: Self-pay | Admitting: Internal Medicine

## 2010-12-18 ENCOUNTER — Other Ambulatory Visit (INDEPENDENT_AMBULATORY_CARE_PROVIDER_SITE_OTHER): Payer: 59

## 2010-12-18 ENCOUNTER — Ambulatory Visit (INDEPENDENT_AMBULATORY_CARE_PROVIDER_SITE_OTHER): Payer: 59 | Admitting: Internal Medicine

## 2010-12-18 DIAGNOSIS — I10 Essential (primary) hypertension: Secondary | ICD-10-CM

## 2010-12-18 DIAGNOSIS — E119 Type 2 diabetes mellitus without complications: Secondary | ICD-10-CM

## 2010-12-18 DIAGNOSIS — E669 Obesity, unspecified: Secondary | ICD-10-CM

## 2010-12-18 DIAGNOSIS — D509 Iron deficiency anemia, unspecified: Secondary | ICD-10-CM

## 2010-12-18 LAB — CBC WITH DIFFERENTIAL/PLATELET
Basophils Absolute: 0 10*3/uL (ref 0.0–0.1)
Eosinophils Absolute: 0.1 10*3/uL (ref 0.0–0.7)
Hemoglobin: 12.1 g/dL (ref 12.0–15.0)
Lymphocytes Relative: 40 % (ref 12.0–46.0)
MCHC: 32.6 g/dL (ref 30.0–36.0)
Monocytes Relative: 5.9 % (ref 3.0–12.0)
Neutro Abs: 4.3 10*3/uL (ref 1.4–7.7)
Platelets: 250 10*3/uL (ref 150.0–400.0)
RDW: 13.8 % (ref 11.5–14.6)

## 2010-12-18 LAB — COMPREHENSIVE METABOLIC PANEL
ALT: 38 U/L — ABNORMAL HIGH (ref 0–35)
CO2: 27 mEq/L (ref 19–32)
Calcium: 9.5 mg/dL (ref 8.4–10.5)
Chloride: 105 mEq/L (ref 96–112)
GFR: 133.39 mL/min (ref >60.00)
Glucose, Bld: 104 mg/dL — ABNORMAL HIGH (ref 70–99)
Sodium: 139 mEq/L (ref 135–145)
Total Protein: 7.7 g/dL (ref 6.0–8.3)

## 2010-12-18 LAB — LIPID PANEL
Cholesterol: 127 mg/dL (ref 0–200)
HDL: 46 mg/dL (ref >39.00)

## 2010-12-18 LAB — HEMOGLOBIN A1C: Hgb A1c MFr Bld: 9.2 % — ABNORMAL HIGH (ref 4.6–6.5)

## 2010-12-18 LAB — TSH: TSH: 1.81 u[IU]/mL (ref 0.35–5.50)

## 2010-12-18 NOTE — Progress Notes (Signed)
Subjective:    Patient ID: Anna Nguyen, female    DOB: 12/20/1985, 25 y.o.   MRN: [id number]  Diabetes She presents for her follow-up diabetic visit. She has type 2 diabetes mellitus. No MedicAlert identification noted. Her disease course has been improving. There are no hypoglycemic associated symptoms. Pertinent negatives for hypoglycemia include no confusion, dizziness, headaches, nervousness/anxiousness, pallor, seizures, speech difficulty or tremors. Pertinent negatives for diabetes include no blurred vision, no chest pain, no fatigue, no foot paresthesias, no foot ulcerations, no polydipsia, no polyphagia, no polyuria, no visual change, no weakness and no weight loss. There are no hypoglycemic complications. Symptoms are improving. There are no diabetic complications. When asked about current treatments, none were reported. She is compliant with treatment some of the time. Her weight is decreasing rapidly. She is following a generally healthy diet. Meal planning includes avoidance of concentrated sweets. She has had a previous visit with a dietician. She participates in exercise three times a week. Her home blood glucose trend is decreasing steadily. Her breakfast blood glucose range is generally 90-110 mg/dl. Her lunch blood glucose range is generally 110-130 mg/dl. Her highest blood glucose is 110-130 mg/dl. Her overall blood glucose range is 90-110 mg/dl. An ACE inhibitor/angiotensin II receptor blocker is contraindicated. She sees a podiatrist.Eye exam is current.      Review of Systems  Constitutional: Positive for unexpected weight change (weight loss s/p lap band procedure). Negative for fever, chills, weight loss, diaphoresis, activity change, appetite change and fatigue.  HENT: Negative for facial swelling, neck pain and neck stiffness.   Eyes: Negative for blurred vision.  Respiratory: Negative for apnea, cough, choking, chest tightness, shortness of breath, wheezing and stridor.     Cardiovascular: Negative for chest pain, palpitations and leg swelling.  Gastrointestinal: Negative for nausea, vomiting, abdominal pain, diarrhea, constipation, blood in stool, abdominal distention and anal bleeding.  Genitourinary: Negative for dysuria, urgency, polyuria, frequency, hematuria, flank pain, decreased urine volume and difficulty urinating.  Musculoskeletal: Negative for myalgias, back pain, joint swelling, arthralgias and gait problem.  Skin: Negative for color change and pallor.  Neurological: Negative for dizziness, tremors, seizures, syncope, facial asymmetry, speech difficulty, weakness, light-headedness, numbness and headaches.  Hematological: Negative for polydipsia, polyphagia and adenopathy. Does not bruise/bleed easily.  Psychiatric/Behavioral: Negative for behavioral problems, confusion, self-injury, dysphoric mood, decreased concentration and agitation. The patient is not nervous/anxious.        Objective:   Physical Exam  Nursing note and vitals reviewed. Constitutional: She is oriented to person, place, and time. She appears well-developed and well-nourished. No distress.  HENT:  Head: Normocephalic and atraumatic.  Right Ear: External ear normal.  Left Ear: External ear normal.  Nose: Nose normal.  Mouth/Throat: Oropharynx is clear and moist. No oropharyngeal exudate.  Eyes: Conjunctivae and EOM are normal. Pupils are equal, round, and reactive to light. Right eye exhibits no discharge. Left eye exhibits no discharge. No scleral icterus.  Neck: Normal range of motion. Neck supple. No thyromegaly present.  Cardiovascular: Normal rate, regular rhythm, normal heart sounds and intact distal pulses.  Exam reveals no gallop and no friction rub.   No murmur heard. Pulmonary/Chest: Effort normal and breath sounds normal. No respiratory distress. She has no wheezes. She has no rales. She exhibits no tenderness.  Abdominal: Soft. Bowel sounds are normal. She exhibits  no distension and no mass. There is no tenderness. There is no rebound and no guarding.  Musculoskeletal: Normal range of motion. She exhibits no edema and no  tenderness.  Lymphadenopathy:    She has no cervical adenopathy.  Neurological: She is alert and oriented to person, place, and time. She has normal reflexes.  Skin: Skin is warm and dry. No rash noted. She is not diaphoretic. No erythema. No pallor.  Psychiatric: She has a normal mood and affect. Her behavior is normal. Judgment and thought content normal.          Assessment & Plan:

## 2010-12-18 NOTE — Assessment & Plan Note (Signed)
This has improved.

## 2010-12-18 NOTE — Patient Instructions (Signed)
Diabetes, Type 2 Diabetes is a lasting (chronic) disease. In type 2 diabetes, the pancreas does not make enough insulin (a hormone), and the body does not respond normally to the insulin that is made. This type of diabetes was also previously called adult onset diabetes. About 90% of all those who have diabetes have type 2. It usually occurs after the age of 40 but can occur at any age. CAUSES Unlike type 1 diabetes, which happens because insulin is no longer being made, type 2 diabetes happens because the body is making less insulin and has trouble using the insulin properly. SYMPTOMS  Drinking more than usual.   Urinating more than usual.   Blurred vision.   Dry, itchy skin.   Frequent infection like yeast infections in women.   More tired than usual (fatigue).  TREATMENT  Healthy eating.   Exercise.   Medication, if needed.   Monitoring blood glucose (sugar).   Seeing your caregiver regularly.  HOME CARE INSTRUCTIONS  Check your blood glucose (sugar) at least once daily. More frequent monitoring may be necessary, depending on your medications and on how well your diabetes is controlled. Your caregiver will advise you.   Take your medicine as directed by your caregiver.   Do not smoke.   Make wise food choices. Ask your caregiver for information. Weight loss can improve your diabetes.   Learn about low blood glucose (hypoglycemia) and how to treat it.   Get your eyes checked regularly.   Have a yearly physical exam. Have your blood pressure checked. Get your blood and urine tested.   Wear a pendant or bracelet saying that you have diabetes.   Check your feet every night for sores. Let your caregiver know if you have sores that are not healing.  SEEK MEDICAL CARE IF:  You are having problems keeping your blood glucose at target range.   You feel you might be having problems with your medicines.   You have symptoms of an illness that is not improving after 24  hours.   You have a sore or wound that is not healing.   You notice a change in vision or a new problem with your vision.   You develop a fever of more than 100.5.  Document Released: 08/25/2005 Document Re-Released: 09/16/2009 [employer] Patient Information 2011 [employer]. 

## 2010-12-18 NOTE — Assessment & Plan Note (Signed)
She has stopped all meds at the direction of her surgeon and she may not need treatment for DM since she had a lap-band procedure done, I will check her A1C level today and make recommendations based on that

## 2010-12-18 NOTE — Assessment & Plan Note (Signed)
I will check her CBC and iron level today, I am concerned about intake and absorption s/s gastric surgery

## 2010-12-18 NOTE — Assessment & Plan Note (Signed)
Her BP is well controlled off of meds, will follow for now

## 2010-12-21 NOTE — Op Note (Signed)
  Anna Nguyen, Anna Nguyen            ACCOUNT NO.:  [id number]  MEDICAL RECORD NO.:  [id number]           PATIENT TYPE:  I  LOCATION:  1533                         FACILITY:  [facility]  PHYSICIAN:  Sandria Bales. Ezzard Standing, M.D.  DATE OF BIRTH:  02/13/1986  DATE OF PROCEDURE:  12/02/2010                              OPERATIVE REPORT   PREOPERATIVE DIAGNOSES:  Morbid obesity, body mass index approximately 38, status post Roux-en-Y gastric bypass.  POSTOPERATIVE DIAGNOSIS:  Morbid obesity (body mass index approximately 38), status post Roux-en-Y gastric bypass.  PROCEDURE:  Esophagogastrojejunoscopy.  SURGEON:  Sandria Bales. Ezzard Standing, MD  No first assistant.  ANESTHESIA:  General endotracheal.  ESTIMATED BLOOD LOSS:  None.  INDICATION FOR PROCEDURE:  Ms. [name] is a 25 year old black female who is morbidly obese and comes for Roux-en-Y gastric bypass.  I am doing upper endoscopy to evaluate the Roux-en-Y gastric bypass with the pouch and anastomosis.  OPERATIVE NOTE: The patient was in supine position.  I used the Olympus flexible endoscope to pass down her mouth into her esophagus and into her stomach.  I identified the esophagogastric junction at about 39 cm.  She had a little bit of a Schatzki ring.  The pouch itself was about 4 cm with her gastrojejunal anastomosis about 43 cm.  I insufflated the pouch where Dr. Johna Sheriff clamped off the jejunum.  He did see very small amount of bubbling at the left lateral edge where the jejunum extended to the stomach.    He placed a single U stitch and I re-endoscoped the patient.  There was no leak from this area.  The remainder of the mucosa of the stomach looked good.  The anastomosis was widely patent.  I took photos of the anastomosis and the pouch.  The scope was withdrawn into the esophagus which was unremarkable and then I removed the scope.  Dr. Johna Sheriff will dictate the Roux-en-Y gastric bypass.  She otherwise tolerated the procedure  well.   Sandria Bales. Ezzard Standing, M.D., FACS   DHN/MEDQ  D:  12/02/2010  T:  12/03/2010  Job:  [id number]  Electronically Signed by Ovidio Kin M.D. on 12/21/2010 02:21:06 PM

## 2011-01-01 NOTE — Discharge Summary (Signed)
  NAMEKATHERYNE, Anna Nguyen            ACCOUNT NO.:  [id number]  MEDICAL RECORD NO.:  [id number]           PATIENT TYPE:  I  LOCATION:  1533                         FACILITY:  [facility]  PHYSICIAN:  Sharlet Salina T. Margrit Minner, M.D.DATE OF BIRTH:  08/18/1986  DATE OF ADMISSION:  12/02/2010 DATE OF DISCHARGE:  12/04/2010                              DISCHARGE SUMMARY   DISCHARGE DIAGNOSIS:  Morbid obesity.  OPERATIONS AND PROCEDURES:  Laparoscopic Roux-en-Y gastric bypass, December 02, 2010.  HISTORY OF PRESENT ILLNESS:  [name] is a 25 year old female with progressive morbid obesity since childhood, unresponsive to medical management.  She presents at 5 feet 8 inches, 285 pounds with a BMI of 43.2 and comorbidities of insulin dependent diabetes mellitus, hypertension and polycystic ovarian syndrome.  After extensive outpatient preoperative workup, the patient is admitted electively for laparoscopic Roux-en-Y gastric bypass.PAST MEDICAL HISTORY:  Only surgery is tonsillectomy.  Medically, as above.  MEDICATIONS:  Glucotrol 50 daily, Lantus 40 at bedtime, Toprol XL daily, metformin 1000 mg daily.  ALLERGIES:  None.  Social history, family history, review of systems; see detailed H and PATIENT.  PERTINENT PHYSICAL EXAMINATION:  Significant only for morbid obesity.  HOSPITAL COURSE:  On the morning of admission, the patient underwent an uneventful laparoscopic Roux-en-Y gastric bypass.  She did complain of some chest pain on the first postoperative night and had negative chest x-ray and EKG.  This resolved.  Her Gastrografin swallow was negative. She was begun on clear liquids on the first postoperative day and able to be advanced to protein shakes on the second postoperative day.  She is doing well at this time, normal vital signs and is discharged home.  DISCHARGE MEDICATIONS:  Metformin, sliding-scale insulin, Toprol, oxycodone for pain.  FOLLOWUP:  Followup is to my office in 1  week.    Lorne Skeens. Brayln Duque, M.D.    Tory Emerald  D:  12/30/2010  T:  12/30/2010  Job:  [id number]  Electronically Signed by Glenna Fellows M.D. on 01/01/2011 11:02:19 AM

## 2011-01-10 ENCOUNTER — Other Ambulatory Visit: Payer: Self-pay | Admitting: Internal Medicine

## 2011-01-21 NOTE — H&P (Signed)
NAME:  Anna Nguyen, Anna Nguyen            ACCOUNT NO.:  [id number]   MEDICAL RECORD NO.:  [id number]          PATIENT TYPE:  EMS   LOCATION:  MAJO                         FACILITY:  [facility]   PHYSICIAN:  Ladell Pier, M.D.   DATE OF BIRTH:  [birth year]   DATE OF ADMISSION:  08/07/2008  DATE OF DISCHARGE:                              HISTORY & PHYSICAL   CHIEF COMPLAINT:  Increased urinary frequency and increased thirst.   HISTORY OF PRESENT ILLNESS:  The patient is a 25 year old African  American female that stated for the past 3 days she has been having  nausea, vomiting and shortness of breath.  She states that she is  working harder to breathe.  She starts hurting all across her chest and  her upper abdomen.  She is a Consulting civil engineer at [employer].  She went to the nurse  today because she felt so bad and was told that her blood sugar was high  and sent to the emergency room.  She noted over the past month and a  half though that she has been having urinary frequency and increased  thirst.  Prior to that, she had no medical problems.   PAST MEDICAL HISTORY:  None.   FAMILY HISTORY:  Mother and father are both healthy.  Mother is 35,  father is 66.   SOCIAL HISTORY:  She does not smoke.  She does not drink alcohol.  She  is single.  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   MEDICATIONS:  None.   ALLERGIES:  NO KNOWN DRUG ALLERGIES.   REVIEW OF SYSTEMS:  Negative, otherwise stated in the HPI.   PHYSICAL EXAMINATION:  VITAL SIGNS:  Temperature 97.7, respirations 16,  heart rate 126, blood pressure 144/95.  Pulse ox 96% on room air.  GENERAL:  The patient is sitting up on a stretcher, does not seem to be  in any acute distress.  HEENT:  Head is normocephalic, atraumatic.  Pupils reactive to light.  Throat without erythema.  CARDIOVASCULAR:  She is tachycardiac.  LUNGS:  Clear bilaterally.  ABDOMEN:  Positive bowel sounds.  Mild diffuse tenderness.  EXTREMITIES:  Without edema.   LABORATORY DATA:  PH 7.178, pCO2 21, pO2 41, bicarb 7.9.  WBC 11.7,  hemoglobin 15.4, hematocrit 47.9, MCV 85.6, platelets 180.  UA greater  than 1000 glucose, greater than 80 ketones, 0-2 WBC, 0-2 RBCs.  Sodium  126, potassium 5.0, chloride 98, CO2 of 8, glucose 681, BUN 13,  creatinine 1.46.  A large amount on acetone in the blood.   ASSESSMENT AND PLAN:  1. New onset diabetes, diabetic ketoacidosis.  2. Obesity.  3. Leukocytosis.   We will admit the patient to the hospital, treat for DKA with IV fluids,  insulin drip, BMP q.2  hours for now until the gap closes.  With put her  on a clear liquid diet.  Will get diabetic educator to talk to her about  diabetes.  Will get EKG, chest x-ray, hemoglobin A1c, fasting lipid  panel, TSH and cycle cardiac enzymes, but her chest  pain is noncardiac  in etiology.  Her chest pain is most likely secondary to her  hyperventilating from her acidosis to compensate.      Ladell Pier, M.D.  Electronically Signed     NJ/MEDQ  D:  08/07/2008  T:  08/07/2008  Job:  [id number]

## 2011-01-21 NOTE — Discharge Summary (Signed)
Anna Nguyen, Anna Nguyen            ACCOUNT NO.:  [id number]   MEDICAL RECORD NO.:  [id number]          PATIENT TYPE:  INP   LOCATION:  5152                         FACILITY:  [facility]   PHYSICIAN:  Ladell Pier, M.D.   DATE OF BIRTH:  1985-09-30   DATE OF ADMISSION:  08/07/2008  DATE OF DISCHARGE:  08/10/2008                               DISCHARGE SUMMARY   DISCHARGE DIAGNOSES:  1. Diabetic ketoacidosis.  2. Vaginal yeast infection.  3. New onset type 2 diabetes.  4. Leukocytosis.  5. Mild dyslipidemia.  6. Hypokalemia.   DISCHARGE MEDICATIONS:  1. Lantus 28 units at bedtime.  2. Insulin 3 units with each meal and the resistance scale sliding      scale insulin.   PROCEDURES:  None.   CONSULTANTS:  None.   HISTORY OF PRESENT ILLNESS:  The patient is a 25 year old African-  American female with no significant past medical history.  She is a  Consulting civil engineer at [employer].  She presented with some shortness of breath and nausea  and vomiting.  Please see admission note for remainder of history.   PAST MEDICAL HISTORY/FAMILY HISTORY/SOCIAL  HISTORY/MEDICATIONS/ALLERGIES/REVIEW OF SYSTEMS:  As per admission H&P.   PHYSICAL EXAMINATION ON DISCHARGE:  VITAL SIGNS:  Temperature 98.3,  pulse of 80, respirations 20, blood pressure 93/53, pulse oximetry 97%  on room air.  CBG of 322.  GENERAL:  The patient is up and walking around, in no acute distress.  HEENT:  Normocephalic, atraumatic.  Pupils reactive to light.  Throat  without erythema.  CARDIOVASCULAR:  Regular rate and rhythm.  LUNGS:  Clear bilaterally.  ABDOMEN:  Positive bowel sounds.  EXTREMITIES:  Without edema.   HOSPITAL COURSE:  1. DKA:  The patient was admitted to the hospital ICU, placed on a      Glucommander drip until her blood sugar stabilized and her anion      gap resolved.  The patient was then discharged home on an increased      dose of Lantus and NovoLog.  She has an appointment with      [facility] to follow up  on August 28, 2008 at 2:15 p.m.  She was      discharged with a Glucometer starter kit and a prescription for      Lantus and NovoLog.  2. Vaginal yeast infection.  She was treated with Diflucan.  3. Hypokalemia that was repleted.  4. Obesity.  Nutrition dietician was consulted, and she was encouraged      with diet and exercise.  5. Dyslipidemia.  Her LDL was 111 which is slightly elevated.      Encouraged her with diet and exercise.   DISCHARGE LABORATORIES:  BMP - sodium 135, potassium 3.6, chloride 108,  CO2 of 17, glucose 391, BUN 5, creatinine 0.81.  Urinalysis - 0-2 WBCs.  TSH of 1.742.  Lipid panel - total cholesterol 193, HDL 42, LDL 127.  Chest x-ray - No acute cardiopulmonary process.      Ladell Pier, M.D.  Electronically Signed     N[place]/MEDQ  D:  08/10/2008  T:  08/10/2008  Job:  [id number] 

## 2011-01-28 ENCOUNTER — Telehealth: Payer: Self-pay | Admitting: *Deleted

## 2011-01-28 MED ORDER — GLUCOSE BLOOD VI STRP: ORAL_STRIP | Status: DC

## 2011-01-28 NOTE — Telephone Encounter (Signed)
rx was sent in for [employer] test strips. Pt's meter is called TrueTest Meter. So, I gave verbal for TrueTest strips # 180 with 3 RF.

## 2011-01-29 ENCOUNTER — Encounter: Payer: 59 | Attending: General Surgery | Admitting: *Deleted

## 2011-01-29 DIAGNOSIS — Z01818 Encounter for other preprocedural examination: Secondary | ICD-10-CM | POA: Insufficient documentation

## 2011-01-29 DIAGNOSIS — Z713 Dietary counseling and surveillance: Secondary | ICD-10-CM | POA: Insufficient documentation

## 2011-02-19 ENCOUNTER — Ambulatory Visit (INDEPENDENT_AMBULATORY_CARE_PROVIDER_SITE_OTHER): Payer: 59 | Admitting: Internal Medicine

## 2011-02-19 ENCOUNTER — Encounter: Payer: Self-pay | Admitting: *Deleted

## 2011-02-19 ENCOUNTER — Other Ambulatory Visit (INDEPENDENT_AMBULATORY_CARE_PROVIDER_SITE_OTHER): Payer: 59

## 2011-02-19 DIAGNOSIS — I10 Essential (primary) hypertension: Secondary | ICD-10-CM

## 2011-02-19 DIAGNOSIS — E119 Type 2 diabetes mellitus without complications: Secondary | ICD-10-CM

## 2011-02-19 DIAGNOSIS — D509 Iron deficiency anemia, unspecified: Secondary | ICD-10-CM

## 2011-02-19 LAB — CBC WITH DIFFERENTIAL/PLATELET
Basophils Absolute: 0 10*3/uL (ref 0.0–0.1)
Eosinophils Absolute: 0 10*3/uL (ref 0.0–0.7)
Hemoglobin: 12.2 g/dL (ref 12.0–15.0)
Lymphocytes Relative: 38.8 % (ref 12.0–46.0)
Lymphs Abs: 2.5 10*3/uL (ref 0.7–4.0)
MCHC: 33.2 g/dL (ref 30.0–36.0)
Neutro Abs: 3.4 10*3/uL (ref 1.4–7.7)
RDW: 15 % — ABNORMAL HIGH (ref 11.5–14.6)

## 2011-02-19 LAB — COMPREHENSIVE METABOLIC PANEL
Alkaline Phosphatase: 87 U/L (ref 39–117)
BUN: 14 mg/dL (ref 6–23)
Glucose, Bld: 87 mg/dL (ref 70–99)
Sodium: 139 mEq/L (ref 135–145)
Total Bilirubin: 0.7 mg/dL (ref 0.3–1.2)

## 2011-02-19 LAB — IBC PANEL
Iron: 56 ug/dL (ref 42–145)
Transferrin: 246.6 mg/dL (ref 212.0–360.0)

## 2011-02-19 NOTE — Assessment & Plan Note (Signed)
It sounds like her blood sugars are well controlled, I will check her A1C today 

## 2011-02-19 NOTE — Patient Instructions (Signed)
Diabetes, Type 2 Diabetes is a lasting (chronic) disease. In type 2 diabetes, the pancreas does not make enough insulin (a hormone), and the body does not respond normally to the insulin that is made. This type of diabetes was also previously called adult onset diabetes. About 90% of all those who have diabetes have type 2. It usually occurs after the age of 3 but can occur at any age. CAUSES Unlike type 1 diabetes, which happens because insulin is no longer being made, type 2 diabetes happens because the body is making less insulin and has trouble using the insulin properly. SYMPTOMS  Drinking more than usual.   Urinating more than usual.   Blurred vision.   Dry, itchy skin.   Frequent infection like yeast infections in women.   More tired than usual (fatigue).  TREATMENT  Healthy eating.   Exercise.   Medication, if needed.   Monitoring blood glucose (sugar).   Seeing your caregiver regularly.  HOME CARE INSTRUCTIONS  Check your blood glucose (sugar) at least once daily. More frequent monitoring may be necessary, depending on your medications and on how well your diabetes is controlled. Your caregiver will advise you.   Take your medicine as directed by your caregiver.   Do not smoke.   Make wise food choices. Ask your caregiver for information. Weight loss can improve your diabetes.   Learn about low blood glucose (hypoglycemia) and how to treat it.   Get your eyes checked regularly.   Have a yearly physical exam. Have your blood pressure checked. Get your blood and urine tested.   Wear a pendant or bracelet saying that you have diabetes.   Check your feet every night for sores. Let your caregiver know if you have sores that are not healing.  SEEK MEDICAL CARE IF:  You are having problems keeping your blood glucose at target range.   You feel you might be having problems with your medicines.   You have symptoms of an illness that is not improving after 24  hours.   You have a sore or wound that is not healing.   You notice a change in vision or a new problem with your vision.  You develop a fever of more than 100.5Anemia - Nonspecific Your exam and blood tests show you are anemic. This means your blood (hemoglobin) level is low. Normal hemoglobin values are 12-15 for females and 14-17 for males. Make a note of your hemoglobin level today. The hematocrit percent is also used to measure anemia. A normal hematocrit is 38-46 in females and 42-49 in males. Make a note of your hematocrit level today. SYMPTOMS Anemia can come on suddenly (acute). It can also come on slowly (chronic). Symptoms can include: Minor weakness.  Dizziness.  Palpitations. Shortness of breath.   Symptoms may be absent until half your hemoglobin is missing if it comes on slowly. Anemia due to acute blood loss from an injury or internal bleeding may require blood transfusion if the loss is severe. Hospital care is needed if you are anemic and there is significant continued blood loss. CAUSES Anemia can be due to many different causes. Excessive bleeding from periods is a common problem in women.  Other causes can include: Intestinal bleeding.  Poor nutrition.  Kidney, thyroid, liver, and bone marrow diseases.  TREATMENT Stool tests for blood (Hemoccult) and additional lab tests are often needed. This determines the best treatment.  Further checking on your condition and your response to treatment is very  important. It often takes many weeks to correct anemia.  Depending on the cause, treatment can include: Supplements of iron.  Vitamins B12 and folic acid.  Hormone medicines.  If your anemia is due to bleeding, finding the cause of the blood loss is very important. This will help avoid further problems. SEEK IMMEDIATE MEDICAL CARE IF: You develop fainting, extreme weakness, shortness of breath, or chest pain.  You develop heavy vaginal bleeding.  You develop bloody or  black, tarry stools or vomit up blood.  You develop a high fever, rash, repeated vomiting, or dehydration.  Document Released: 10/02/2004 Document Re-Released: 02/12/2010  [facility] Patient Information 2011 [place], [place]..  Document Released: 08/25/2005 Document Re-Released: 09/16/2009 [facility] Patient Information 2011 [place], [place].

## 2011-02-19 NOTE — Assessment & Plan Note (Signed)
Her BP is well controlled 

## 2011-02-19 NOTE — Assessment & Plan Note (Signed)
I will recheck her CBC and monitor her B12 and iron levels

## 2011-02-19 NOTE — Progress Notes (Signed)
Subjective:    Patient ID: Anna Nguyen, female    DOB: 1986/08/03, 25 y.o.   MRN: [id number]  Diabetes She presents for her follow-up diabetic visit. She has type 2 diabetes mellitus. The initial diagnosis of diabetes was made 10 years ago. Her disease course has been improving. There are no hypoglycemic associated symptoms. Pertinent negatives for hypoglycemia include no dizziness, headaches, pallor, seizures, speech difficulty or tremors. Pertinent negatives for diabetes include no blurred vision, no chest pain, no fatigue, no foot paresthesias, no foot ulcerations, no polydipsia, no polyphagia, no polyuria, no visual change, no weakness and no weight loss. There are no hypoglycemic complications. Symptoms are stable. Current diabetic treatment includes diet. Her weight is decreasing steadily. She is following a generally healthy diet. Meal planning includes avoidance of concentrated sweets. She has had a previous visit with a dietician. She participates in exercise every other day. Her home blood glucose trend is decreasing rapidly. Her breakfast blood glucose range is generally 70-90 mg/dl. Her lunch blood glucose range is generally 70-90 mg/dl. Her dinner blood glucose range is generally 90-110 mg/dl. Her highest blood glucose is 90-110 mg/dl. Her overall blood glucose range is 90-110 mg/dl. An ACE inhibitor/angiotensin II receptor blocker is contraindicated. She does not see a podiatrist.Eye exam is current.      Review of Systems  Constitutional: Negative for fever, chills, weight loss, diaphoresis, activity change, appetite change, fatigue and unexpected weight change.  HENT: Negative.   Eyes: Negative.  Negative for blurred vision.  Respiratory: Negative for apnea, cough, choking, chest tightness, shortness of breath, wheezing and stridor.   Cardiovascular: Negative for chest pain, palpitations and leg swelling.  Gastrointestinal: Negative for nausea, vomiting, diarrhea, abdominal  distention and anal bleeding.  Genitourinary: Negative for dysuria, urgency, polyuria, frequency, hematuria, flank pain, decreased urine volume, enuresis, difficulty urinating and dyspareunia.  Musculoskeletal: Negative for back pain, joint swelling, arthralgias and gait problem.  Skin: Negative for color change, pallor and rash.  Neurological: Negative for dizziness, tremors, seizures, syncope, facial asymmetry, speech difficulty, weakness, light-headedness, numbness and headaches.  Hematological: Negative for polydipsia, polyphagia and adenopathy. Does not bruise/bleed easily.  Psychiatric/Behavioral: Negative.        Objective:   Physical Exam  Vitals reviewed. Constitutional: She is oriented to person, place, and time. She appears well-developed and well-nourished. No distress.  HENT:  Head: Normocephalic and atraumatic.  Right Ear: External ear normal.  Left Ear: External ear normal.  Nose: Nose normal.  Mouth/Throat: Oropharynx is clear and moist. No oropharyngeal exudate.  Eyes: Conjunctivae and EOM are normal. Pupils are equal, round, and reactive to light. Right eye exhibits no discharge. Left eye exhibits no discharge. No scleral icterus.  Neck: Normal range of motion. Neck supple. No JVD present. No tracheal deviation present. No thyromegaly present.  Cardiovascular: Normal rate, regular rhythm, normal heart sounds and intact distal pulses.  Exam reveals no gallop and no friction rub.   No murmur heard. Pulmonary/Chest: Effort normal and breath sounds normal. No stridor. No respiratory distress. She has no wheezes. She has no rales. She exhibits no tenderness.  Abdominal: Soft. Bowel sounds are normal. She exhibits no distension and no mass. There is no tenderness. There is no rebound and no guarding.  Musculoskeletal: Normal range of motion. She exhibits no edema and no tenderness.  Lymphadenopathy:    She has no cervical adenopathy.  Neurological: She is alert and oriented  to person, place, and time. She has normal reflexes. No cranial nerve deficit. Coordination  normal.  Skin: Skin is warm and dry. No rash noted. She is not diaphoretic. No erythema. No pallor.  Psychiatric: She has a normal mood and affect. Her behavior is normal. Judgment and thought content normal.        Lab Results  Component Value Date   WBC 8.2 12/18/2010   HGB 12.1 12/18/2010   HCT 37.0 12/18/2010   PLT 250.0 12/18/2010   CHOL 127 12/18/2010   TRIG 62.0 12/18/2010   HDL 46.00 12/18/2010   ALT 38* 12/18/2010   AST 27 12/18/2010   NA 139 12/18/2010   K 4.2 12/18/2010   CL 105 12/18/2010   CREATININE 0.7 12/18/2010   BUN 10 12/18/2010   CO2 27 12/18/2010   TSH 1.81 12/18/2010   HGBA1C 9.2* 12/18/2010   MICROALBUR 1.13 10/03/2008    Assessment & Plan:

## 2011-02-20 ENCOUNTER — Encounter: Payer: Self-pay | Admitting: Internal Medicine

## 2011-02-20 LAB — FERRITIN: Ferritin: 59.2 ng/mL (ref 10.0–291.0)

## 2011-02-20 LAB — TSH: TSH: 0.83 u[IU]/mL (ref 0.35–5.50)

## 2011-02-22 LAB — METHYLMALONIC ACID, SERUM: Methylmalonic Acid, Quantitative: 102 nmol/L (ref 87–318)

## 2011-03-03 ENCOUNTER — Ambulatory Visit: Payer: 59 | Admitting: *Deleted

## 2011-03-03 ENCOUNTER — Encounter: Payer: 59 | Attending: General Surgery | Admitting: *Deleted

## 2011-03-03 DIAGNOSIS — Z713 Dietary counseling and surveillance: Secondary | ICD-10-CM | POA: Insufficient documentation

## 2011-03-03 DIAGNOSIS — Z01818 Encounter for other preprocedural examination: Secondary | ICD-10-CM | POA: Insufficient documentation

## 2011-03-16 ENCOUNTER — Emergency Department (HOSPITAL_COMMUNITY)
Admission: EM | Admit: 2011-03-16 | Discharge: 2011-03-17 | Disposition: A | Discharge status: Home / Self Care | Payer: 59 | Attending: Emergency Medicine | Admitting: Emergency Medicine

## 2011-03-16 DIAGNOSIS — D649 Anemia, unspecified: Secondary | ICD-10-CM | POA: Insufficient documentation

## 2011-03-16 DIAGNOSIS — Z9884 Bariatric surgery status: Secondary | ICD-10-CM | POA: Insufficient documentation

## 2011-03-16 DIAGNOSIS — E119 Type 2 diabetes mellitus without complications: Secondary | ICD-10-CM | POA: Insufficient documentation

## 2011-03-16 DIAGNOSIS — R0602 Shortness of breath: Secondary | ICD-10-CM | POA: Insufficient documentation

## 2011-03-17 LAB — BASIC METABOLIC PANEL
Chloride: 105 mEq/L (ref 96–112)
GFR calc Af Amer: >60 mL/min (ref >60)
Potassium: 3.5 mEq/L (ref 3.5–5.1)
Sodium: 140 mEq/L (ref 135–145)

## 2011-03-17 LAB — DIFFERENTIAL
Basophils Relative: 0 % (ref 0–1)
Eosinophils Absolute: 0.1 10*3/uL (ref 0.0–0.7)
Monocytes Absolute: 0.3 10*3/uL (ref 0.1–1.0)
Monocytes Relative: 5 % (ref 3–12)

## 2011-03-17 LAB — CBC
MCH: 27.7 pg (ref 26.0–34.0)
MCHC: 33.3 g/dL (ref 30.0–36.0)
Platelets: 230 10*3/uL (ref 150–400)

## 2011-04-04 ENCOUNTER — Ambulatory Visit (INDEPENDENT_AMBULATORY_CARE_PROVIDER_SITE_OTHER): Payer: 59 | Admitting: General Surgery

## 2011-04-04 ENCOUNTER — Encounter (INDEPENDENT_AMBULATORY_CARE_PROVIDER_SITE_OTHER): Payer: 59 | Admitting: General Surgery

## 2011-04-16 ENCOUNTER — Encounter (INDEPENDENT_AMBULATORY_CARE_PROVIDER_SITE_OTHER): Payer: 59 | Admitting: General Surgery

## 2011-05-06 ENCOUNTER — Ambulatory Visit (INDEPENDENT_AMBULATORY_CARE_PROVIDER_SITE_OTHER): Payer: 59 | Admitting: Internal Medicine

## 2011-05-06 ENCOUNTER — Other Ambulatory Visit: Payer: Self-pay | Admitting: Internal Medicine

## 2011-05-06 ENCOUNTER — Other Ambulatory Visit (INDEPENDENT_AMBULATORY_CARE_PROVIDER_SITE_OTHER): Payer: 59

## 2011-05-06 ENCOUNTER — Encounter: Payer: Self-pay | Admitting: Internal Medicine

## 2011-05-06 VITALS — BP 106/76 | HR 78 | Temp 98.1°F | Resp 16 | Wt 192.0 lb

## 2011-05-06 DIAGNOSIS — Z9884 Bariatric surgery status: Secondary | ICD-10-CM | POA: Insufficient documentation

## 2011-05-06 DIAGNOSIS — Z309 Encounter for contraceptive management, unspecified: Secondary | ICD-10-CM | POA: Insufficient documentation

## 2011-05-06 DIAGNOSIS — D509 Iron deficiency anemia, unspecified: Secondary | ICD-10-CM

## 2011-05-06 DIAGNOSIS — I10 Essential (primary) hypertension: Secondary | ICD-10-CM

## 2011-05-06 DIAGNOSIS — E119 Type 2 diabetes mellitus without complications: Secondary | ICD-10-CM

## 2011-05-06 LAB — CBC WITH DIFFERENTIAL/PLATELET
Eosinophils Relative: 0.8 % (ref 0.0–5.0)
HCT: 36 % (ref 36.0–46.0)
Hemoglobin: 11.6 g/dL — ABNORMAL LOW (ref 12.0–15.0)
Lymphs Abs: 3.5 10*3/uL (ref 0.7–4.0)
Monocytes Relative: 6 % (ref 3.0–12.0)
Neutro Abs: 3 10*3/uL (ref 1.4–7.7)
WBC: 7 10*3/uL (ref 4.5–10.5)

## 2011-05-06 LAB — IBC PANEL
Iron: 42 ug/dL (ref 42–145)
Transferrin: 245.2 mg/dL (ref 212.0–360.0)

## 2011-05-06 LAB — HCG, QUANTITATIVE, PREGNANCY: hCG, Beta Chain, Quant, S: 0.5 m[IU]/mL

## 2011-05-06 LAB — FERRITIN: Ferritin: 44.9 ng/mL (ref 10.0–291.0)

## 2011-05-06 LAB — VITAMIN B12: Vitamin B-12: 358 pg/mL (ref 211–911)

## 2011-05-06 LAB — HEMOGLOBIN A1C: Hgb A1c MFr Bld: 5.5 % (ref 4.6–6.5)

## 2011-05-06 MED ORDER — FERROUS SULFATE 325 (65 FE) MG PO TABS: 325.0000 mg | ORAL_TABLET | Freq: Three times a day (TID) | ORAL | Status: DC

## 2011-05-06 NOTE — Progress Notes (Signed)
Subjective:    Patient ID: Anna Nguyen, female    DOB: October 30, 1985, 25 y.o.   MRN: [id number]  Anemia Presents for follow-up visit. Symptoms include malaise/fatigue. There has been no abdominal pain, anorexia, bruising/bleeding easily, confusion, fever, leg swelling, light-headedness, pallor, palpitations, paresthesias, pica or weight loss. Signs of blood loss that are present include menorrhagia. Signs of blood loss that are not present include hematemesis, hematochezia, melena and vaginal bleeding. Compliance problems include medication side effects.  Compliance with medications is 0-25%. Side effects of medications include GI discomfort.  Diabetes She presents for her follow-up diabetic visit. She has type 2 diabetes mellitus. Her disease course has been improving. There are no hypoglycemic associated symptoms. Pertinent negatives for hypoglycemia include no confusion, dizziness, headaches, nervousness/anxiousness, pallor, seizures, speech difficulty or tremors. Associated symptoms include fatigue. Pertinent negatives for diabetes include no blurred vision, no chest pain, no foot paresthesias, no polydipsia, no polyphagia, no polyuria, no visual change, no weakness and no weight loss. There are no hypoglycemic complications. Symptoms are stable. There are no diabetic complications. Current diabetic treatment includes diet. She is compliant with treatment all of the time. Her weight is stable. She is following a generally healthy diet. Meal planning includes avoidance of concentrated sweets. She has had a previous visit with a dietician. She participates in exercise intermittently. There is no change in her home blood glucose trend. Her breakfast blood glucose range is generally 70-90 mg/dl. Her lunch blood glucose range is generally 90-110 mg/dl. Her dinner blood glucose range is generally 90-110 mg/dl. Her highest blood glucose is 90-110 mg/dl. Her overall blood glucose range is 90-110 mg/dl. An ACE  inhibitor/angiotensin II receptor blocker is contraindicated. She does not see a podiatrist.Eye exam is current.      Review of Systems  Constitutional: Positive for malaise/fatigue and fatigue. Negative for fever, chills, weight loss, diaphoresis, activity change, appetite change and unexpected weight change.  HENT: Negative for sore throat, facial swelling, trouble swallowing, neck pain, neck stiffness and voice change.   Eyes: Negative for blurred vision, photophobia, pain, discharge, redness, itching and visual disturbance.  Respiratory: Negative for apnea, cough, choking, chest tightness, shortness of breath, wheezing and stridor.   Cardiovascular: Negative for chest pain, palpitations and leg swelling.  Gastrointestinal: Negative for nausea, vomiting, abdominal pain, diarrhea, constipation, blood in stool, melena, hematochezia, abdominal distention, anal bleeding, rectal pain, anorexia and hematemesis.  Genitourinary: Positive for menstrual problem (heavy periods) and menorrhagia. Negative for dysuria, urgency, polyuria, frequency, hematuria, flank pain, decreased urine volume, vaginal bleeding, vaginal discharge, enuresis, difficulty urinating, vaginal pain, pelvic pain and dyspareunia.  Musculoskeletal: Negative for myalgias, back pain, joint swelling, arthralgias and gait problem.  Skin: Negative for color change, pallor, rash and wound.  Neurological: Negative for dizziness, tremors, seizures, syncope, facial asymmetry, speech difficulty, weakness, light-headedness, numbness, headaches and paresthesias.  Hematological: Negative for polydipsia, polyphagia and adenopathy. Does not bruise/bleed easily.  Psychiatric/Behavioral: Negative for suicidal ideas, hallucinations, behavioral problems, confusion, sleep disturbance, self-injury, dysphoric mood, decreased concentration and agitation. The patient is not nervous/anxious and is not hyperactive.        Objective:   Physical Exam  Vitals  reviewed. Constitutional: She is oriented to person, place, and time. She appears well-developed and well-nourished. No distress.  HENT:  Head: Normocephalic and atraumatic.  Mouth/Throat: No oropharyngeal exudate.  Eyes: Conjunctivae are normal. Right eye exhibits no discharge. Left eye exhibits no discharge. No scleral icterus.  Neck: Normal range of motion. Neck supple. No JVD present. No  tracheal deviation present. No thyromegaly present.  Cardiovascular: Normal rate, regular rhythm, normal heart sounds and intact distal pulses.  Exam reveals no gallop and no friction rub.   No murmur heard. Pulmonary/Chest: Effort normal and breath sounds normal. No stridor. No respiratory distress. She has no wheezes. She has no rales. She exhibits no tenderness.  Abdominal: Soft. Bowel sounds are normal. She exhibits no distension and no mass. There is no tenderness. There is no rebound and no guarding.  Musculoskeletal: Normal range of motion. She exhibits no edema and no tenderness.  Lymphadenopathy:    She has no cervical adenopathy.  Neurological: She is alert and oriented to person, place, and time. She has normal reflexes. She displays normal reflexes. No cranial nerve deficit. She exhibits normal muscle tone. Coordination normal.  Skin: Skin is warm and dry. No rash noted. She is not diaphoretic. No erythema. No pallor.  Psychiatric: She has a normal mood and affect. Her behavior is normal. Judgment and thought content normal.      Lab Results  Component Value Date   WBC 7.3 03/16/2011   HGB 11.9* 03/16/2011   HCT 35.7* 03/16/2011   PLT 230 03/16/2011   CHOL 127 12/18/2010   TRIG 62.0 12/18/2010   HDL 46.00 12/18/2010   ALT 67* 02/19/2011   AST 35 02/19/2011   NA 140 03/16/2011   K 3.5 03/16/2011   CL 105 03/16/2011   CREATININE 0.62 03/16/2011   BUN 10 03/16/2011   CO2 26 03/16/2011   TSH 0.83 02/19/2011   HGBA1C 6.5 02/19/2011   MICROALBUR 1.13 10/03/2008      Assessment & Plan:

## 2011-05-06 NOTE — Patient Instructions (Signed)
Anemia - Nonspecific Your exam and blood tests show you are anemic. This means your blood (hemoglobin) level is low. Normal hemoglobin values are 12-15 for females and 14-17 for males. Make a note of your hemoglobin level today. The hematocrit percent is also used to measure anemia. A normal hematocrit is 38-46 in females and 42-49 in males. Make a note of your hematocrit level today. SYMPTOMS Anemia can come on suddenly (acute). It can also come on slowly (chronic). Symptoms can include:  Minor weakness.   Dizziness.   Palpitations.  Shortness of breath.   Symptoms may be absent until half your hemoglobin is missing if it comes on slowly. Anemia due to acute blood loss from an injury or internal bleeding may require blood transfusion if the loss is severe. Hospital care is needed if you are anemic and there is significant continued blood loss. CAUSES Anemia can be due to many different causes.  Excessive bleeding from periods is a common problem in women.  Other causes can include:  Intestinal bleeding.   Poor nutrition.   Kidney, thyroid, liver, and bone marrow diseases.  TREATMENT  Stool tests for blood (Hemoccult) and additional lab tests are often needed. This determines the best treatment.   Further checking on your condition and your response to treatment is very important. It often takes many weeks to correct anemia.  Depending on the cause, treatment can include:  Supplements of iron.   Vitamins B12 and folic acid.   Hormone medicines.  If your anemia is due to bleeding, finding the cause of the blood loss is very important. This will help avoid further problems. SEEK IMMEDIATE MEDICAL CARE IF:  You develop fainting, extreme weakness, shortness of breath, or chest pain.   You develop heavy vaginal bleeding.   You develop bloody or black, tarry stools or vomit up blood.   You develop a high fever, rash, repeated vomiting, or dehydration.  Document Released:  10/02/2004 Document Re-Released: 02/12/2010 [employer] Patient Information 2011 [employer]. 

## 2011-05-07 ENCOUNTER — Encounter: Payer: Self-pay | Admitting: Internal Medicine

## 2011-05-07 NOTE — Assessment & Plan Note (Signed)
I will check her A1C and will monitor her renal function 

## 2011-05-07 NOTE — Assessment & Plan Note (Signed)
Her BP is well controlled 

## 2011-05-07 NOTE — Assessment & Plan Note (Signed)
I will check her vitamin levels and labs s/p surgery

## 2011-05-07 NOTE — Assessment & Plan Note (Signed)
Today I will check her CBC and vitamin levels, also will ask her to see a GYN to get the heavy periods treated

## 2011-05-21 ENCOUNTER — Ambulatory Visit: Payer: 59 | Admitting: Internal Medicine

## 2011-06-03 ENCOUNTER — Ambulatory Visit: Payer: 59 | Admitting: *Deleted

## 2011-06-09 ENCOUNTER — Ambulatory Visit: Payer: 59 | Admitting: Internal Medicine

## 2011-06-09 DIAGNOSIS — Z0289 Encounter for other administrative examinations: Secondary | ICD-10-CM

## 2011-06-10 ENCOUNTER — Encounter: Payer: Self-pay | Admitting: *Deleted

## 2011-06-10 ENCOUNTER — Encounter: Payer: 59 | Attending: General Surgery | Admitting: *Deleted

## 2011-06-10 DIAGNOSIS — Z713 Dietary counseling and surveillance: Secondary | ICD-10-CM | POA: Insufficient documentation

## 2011-06-10 DIAGNOSIS — Z09 Encounter for follow-up examination after completed treatment for conditions other than malignant neoplasm: Secondary | ICD-10-CM | POA: Insufficient documentation

## 2011-06-10 DIAGNOSIS — Z9884 Bariatric surgery status: Secondary | ICD-10-CM

## 2011-06-10 LAB — BASIC METABOLIC PANEL
BUN: 10 mg/dL (ref 6–23)
BUN: 13 mg/dL (ref 6–23)
CO2: 8 mEq/L — CL (ref 19–32)
Chloride: 111 mEq/L (ref 96–112)
GFR calc non Af Amer: 45 mL/min — ABNORMAL LOW (ref >60)
GFR calc non Af Amer: 55 mL/min — ABNORMAL LOW (ref >60)
Glucose, Bld: 274 mg/dL — ABNORMAL HIGH (ref 70–99)
Glucose, Bld: 681 mg/dL (ref 70–99)
Potassium: 4 mEq/L (ref 3.5–5.1)
Potassium: 5 mEq/L (ref 3.5–5.1)
Sodium: 137 mEq/L (ref 135–145)

## 2011-06-10 LAB — POCT I-STAT 3, VENOUS BLOOD GAS (G3P V)
O2 Saturation: 65 %
TCO2: 9 mmol/L (ref 0–100)
pCO2, Ven: 21.2 mmHg — ABNORMAL LOW (ref 45.0–50.0)
pH, Ven: 7.178 — CL (ref 7.250–7.300)
pO2, Ven: 41 mmHg (ref 30.0–45.0)

## 2011-06-10 LAB — TROPONIN I: Troponin I: 0.01 ng/mL (ref 0.00–0.06)

## 2011-06-10 LAB — URINALYSIS, ROUTINE W REFLEX MICROSCOPIC
Bilirubin Urine: NEGATIVE
Ketones, ur: >80 mg/dL — AB
Leukocytes, UA: NEGATIVE
Nitrite: NEGATIVE
Protein, ur: 30 mg/dL — AB
Urobilinogen, UA: 0.2 mg/dL (ref 0.0–1.0)
pH: 5 (ref 5.0–8.0)

## 2011-06-10 LAB — CBC
HCT: 47.9 % — ABNORMAL HIGH (ref 36.0–46.0)
MCHC: 32.1 g/dL (ref 30.0–36.0)
Platelets: 180 10*3/uL (ref 150–400)
RDW: 13.4 % (ref 11.5–15.5)

## 2011-06-10 LAB — DIFFERENTIAL
Basophils Absolute: 0 10*3/uL (ref 0.0–0.1)
Basophils Relative: 0 % (ref 0–1)
Eosinophils Absolute: 0 10*3/uL (ref 0.0–0.7)
Eosinophils Relative: 0 % (ref 0–5)
Lymphocytes Relative: 18 % (ref 12–46)

## 2011-06-10 LAB — CK TOTAL AND CKMB (NOT AT ARMC)
CK, MB: 1.2 ng/mL (ref 0.3–4.0)
Relative Index: 1 (ref 0.0–2.5)

## 2011-06-10 LAB — GLUCOSE, CAPILLARY
Glucose-Capillary: 257 mg/dL — ABNORMAL HIGH (ref 70–99)
Glucose-Capillary: 419 mg/dL — ABNORMAL HIGH (ref 70–99)
Glucose-Capillary: 574 mg/dL (ref 70–99)

## 2011-06-10 LAB — LIPID PANEL
LDL Cholesterol: 127 mg/dL — ABNORMAL HIGH (ref 0–99)
Total CHOL/HDL Ratio: 4.6 RATIO
Triglycerides: 120 mg/dL (ref <150)
VLDL: 24 mg/dL (ref 0–40)

## 2011-06-10 NOTE — Progress Notes (Signed)
  Follow-up visit: 6 Months Post-Operative Gastric Bypass Surgery  Medical Nutrition Therapy:  Appt start time: 1100 end time:  1130.  Assessment:  Primary concerns today: post-operative bariatric surgery nutrition management. Anna Nguyen is doing well and reports no nutrition related problems after surgery. She reports increased activity, good energy levels, and resolved DM.  Weight today: 185.8 lbs Weight change: 33.3 lbs Total weight lost: 98.8 lbs total BMI: 28.3% Weight goal: <195 lbs % Weight goal met: 100% pt goal met *Pt would like to continue to lose ~5-10 more lbs.  24-hr recall:  B (6 PM): Yogurt cup Snk (7 and 9 PM): Peanuts (1 oz) and Protein shake  L (12 AM): Baked chicken, green beans, 1/4 c. Mashed potato Snk (3 AM): Peanuts (1oz) D (6 PM): Egg w/ cheese OR Protein shake Snk (PM): N/A *Pt works 3rd shift  Fluid intake: Water, Protein shake, Crystal Light = 64 oz + Estimated total protein intake: 60-75g  Medications: None Supplementation: Iron supplements daily (245 mg), Calcium citrate, MVI, B12  Lab Results  Component Value Date   HGBA1C 5.5 05/06/2011   Using straws: No  Drinking while eating:No Hair loss: No Carbonated beverages: No N/V/D/C: No Dumping syndrome: None reported  Recent physical activity:  Exercises for 45 minutes, 4 times/week (weights/cardio training)  Progress Towards Goal(s):  In progress.   Nutritional Diagnosis:  [place]-3.3 Overweight/obesity As related to recent Gastric Bypass surgery.  As evidenced by pt following Gastric Bypass nutrition guidelines for continued weight loss.    Intervention:  Nutrition education.  Monitoring/Evaluation:  Dietary intake, exercise, lap band fills, and body weight. Follow up in 6 months for 12 month post-op visit.

## 2011-06-10 NOTE — Patient Instructions (Signed)
  Goals:  Follow Phase 3B: High Protein + Non-Starchy Vegetables  Eat 3-6 small meals/snacks, every 3-5 hrs  Increase lean protein foods to meet 80g goal  Increase fluid intake to 64oz +  Add 15 grams of carbohydrate (fruit, whole grain, starchy vegetable) with meals  Avoid drinking 15 minutes before, during and 30 minutes after eating  Aim for >30 min of physical activity daily 

## 2011-06-13 LAB — GLUCOSE, CAPILLARY
Glucose-Capillary: 173 mg/dL — ABNORMAL HIGH (ref 70–99)
Glucose-Capillary: 237 mg/dL — ABNORMAL HIGH (ref 70–99)
Glucose-Capillary: 245 mg/dL — ABNORMAL HIGH (ref 70–99)
Glucose-Capillary: 265 mg/dL — ABNORMAL HIGH (ref 70–99)
Glucose-Capillary: 297 mg/dL — ABNORMAL HIGH (ref 70–99)
Glucose-Capillary: 349 mg/dL — ABNORMAL HIGH (ref 70–99)

## 2011-06-13 LAB — BASIC METABOLIC PANEL
BUN: 5 mg/dL — ABNORMAL LOW (ref 6–23)
BUN: 9 mg/dL (ref 6–23)
CO2: 12 mEq/L — ABNORMAL LOW (ref 19–32)
CO2: 13 mEq/L — ABNORMAL LOW (ref 19–32)
CO2: 15 mEq/L — ABNORMAL LOW (ref 19–32)
Calcium: 8.4 mg/dL (ref 8.4–10.5)
Calcium: 8.8 mg/dL (ref 8.4–10.5)
Calcium: 8.8 mg/dL (ref 8.4–10.5)
Calcium: 9 mg/dL (ref 8.4–10.5)
Chloride: 108 mEq/L (ref 96–112)
Creatinine, Ser: 1 mg/dL (ref 0.4–1.2)
Creatinine, Ser: 1.03 mg/dL (ref 0.4–1.2)
Creatinine, Ser: 1.13 mg/dL (ref 0.4–1.2)
Creatinine, Ser: 1.17 mg/dL (ref 0.4–1.2)
GFR calc Af Amer: >60 mL/min (ref >60)
GFR calc Af Amer: >60 mL/min (ref >60)
GFR calc Af Amer: >60 mL/min (ref >60)
GFR calc non Af Amer: 58 mL/min — ABNORMAL LOW (ref >60)
GFR calc non Af Amer: >60 mL/min (ref >60)
GFR calc non Af Amer: >60 mL/min (ref >60)
GFR calc non Af Amer: >60 mL/min (ref >60)
GFR calc non Af Amer: >60 mL/min (ref >60)
Glucose, Bld: 141 mg/dL — ABNORMAL HIGH (ref 70–99)
Glucose, Bld: 192 mg/dL — ABNORMAL HIGH (ref 70–99)
Glucose, Bld: 220 mg/dL — ABNORMAL HIGH (ref 70–99)
Glucose, Bld: 317 mg/dL — ABNORMAL HIGH (ref 70–99)
Potassium: 3 mEq/L — ABNORMAL LOW (ref 3.5–5.1)
Potassium: 3.6 mEq/L (ref 3.5–5.1)
Potassium: 3.8 mEq/L (ref 3.5–5.1)
Potassium: 3.9 mEq/L (ref 3.5–5.1)
Sodium: 133 mEq/L — ABNORMAL LOW (ref 135–145)
Sodium: 135 mEq/L (ref 135–145)
Sodium: 137 mEq/L (ref 135–145)

## 2011-06-13 LAB — COMPREHENSIVE METABOLIC PANEL
Albumin: 3.7 g/dL (ref 3.5–5.2)
Alkaline Phosphatase: 88 U/L (ref 39–117)
BUN: 7 mg/dL (ref 6–23)
CO2: 12 mEq/L — ABNORMAL LOW (ref 19–32)
Chloride: 111 mEq/L (ref 96–112)
GFR calc non Af Amer: >60 mL/min (ref >60)
Glucose, Bld: 161 mg/dL — ABNORMAL HIGH (ref 70–99)
Potassium: 3.3 mEq/L — ABNORMAL LOW (ref 3.5–5.1)
Total Bilirubin: 1.5 mg/dL — ABNORMAL HIGH (ref 0.3–1.2)

## 2011-06-13 LAB — CBC
HCT: 41.3 % (ref 36.0–46.0)
Hemoglobin: 13.9 g/dL (ref 12.0–15.0)
Platelets: 196 10*3/uL (ref 150–400)
RBC: 4.98 MIL/uL (ref 3.87–5.11)
WBC: 11.4 10*3/uL — ABNORMAL HIGH (ref 4.0–10.5)

## 2011-06-13 LAB — URINALYSIS, ROUTINE W REFLEX MICROSCOPIC
Bilirubin Urine: NEGATIVE
Hgb urine dipstick: NEGATIVE
Nitrite: NEGATIVE
Specific Gravity, Urine: 1.035 — ABNORMAL HIGH (ref 1.005–1.030)
pH: 6 (ref 5.0–8.0)

## 2011-06-13 LAB — CARDIAC PANEL(CRET KIN+CKTOT+MB+TROPI)
Relative Index: 1.1 (ref 0.0–2.5)
Total CK: 115 U/L (ref 7–177)
Troponin I: 0.01 ng/mL (ref 0.00–0.06)

## 2011-06-13 LAB — URINE MICROSCOPIC-ADD ON

## 2011-06-18 ENCOUNTER — Ambulatory Visit (INDEPENDENT_AMBULATORY_CARE_PROVIDER_SITE_OTHER): Payer: 59 | Admitting: General Surgery

## 2011-06-23 ENCOUNTER — Encounter (INDEPENDENT_AMBULATORY_CARE_PROVIDER_SITE_OTHER): Payer: Self-pay | Admitting: General Surgery

## 2011-08-15 ENCOUNTER — Ambulatory Visit (INDEPENDENT_AMBULATORY_CARE_PROVIDER_SITE_OTHER): Payer: 59 | Admitting: General Surgery

## 2011-08-15 ENCOUNTER — Encounter (INDEPENDENT_AMBULATORY_CARE_PROVIDER_SITE_OTHER): Payer: Self-pay | Admitting: General Surgery

## 2011-08-15 VITALS — BP 122/84 | HR 104 | Temp 98.0°F | Resp 12 | Ht 69.0 in | Wt 173.0 lb

## 2011-08-15 DIAGNOSIS — Z9884 Bariatric surgery status: Secondary | ICD-10-CM

## 2011-08-15 NOTE — Progress Notes (Signed)
History: Patient returns for followup of laparoscopic Roux-en-Y gastric bypass, now 9 months postop. As far as her bypass she continued to do very well. Her current weight is 173 is a 112 pound weight loss from preop. Her BMI is 25.6 down from 43.2 preop. She has preoperative comorbidities of insulin-dependent diabetes and hypertension that are resolved.  Her current issue is however she has found that she is unexpectedly pregnant. She has hyperemesis and has been unable to take much nourishment. She's being followed on an essentially weekly basis by her obstetrician and so far her lab work, fetal development, etc. have all been going along okay. She does not have  abdominal pain or obstructive symptoms.  Exam: General: Normal weight female who does not appear ill Skin: Warm and dry without rashes normal turgor Abdomen: Soft and nontender. Wounds well healed.  Lab work: Patient reports that her chemistries, blood count,  Iron and vitamin levels are being followed closely by her obstetrician. I do not have access to these on the computer but will not redraw them.  Assessment and plan: Doing very well following gastric bypass with resolution of diabetes and hypertension. She is however pregnant during a period of weight loss and also has what appears to be hyperemesis. It sounds like this is being followed very closely by her obstetrician. She may need nutritional support. I told her to keep me informed or her OB could call me as necessary. I will see her back in 3 months.

## 2011-12-09 ENCOUNTER — Ambulatory Visit: Payer: 59 | Admitting: *Deleted

## 2012-01-22 ENCOUNTER — Ambulatory Visit (INDEPENDENT_AMBULATORY_CARE_PROVIDER_SITE_OTHER): Payer: 59 | Admitting: General Surgery

## 2012-01-30 ENCOUNTER — Encounter (INDEPENDENT_AMBULATORY_CARE_PROVIDER_SITE_OTHER): Payer: Self-pay | Admitting: General Surgery

## 2012-05-28 ENCOUNTER — Telehealth (INDEPENDENT_AMBULATORY_CARE_PROVIDER_SITE_OTHER): Payer: Self-pay | Admitting: General Surgery

## 2012-05-28 NOTE — Telephone Encounter (Signed)
I spoke with the patient about scheduling a 1 year follow-up appointment for bariatric surgery. She states she had cancelled an appointment due to losing her job and having no insurance and was told by a rep at [facility] that we would not keep rescheduling. The patient states she thought she had been "kicked to the curb" and would need to find a new bariatric surgeon to follow up with. I advised the patient that she may contact [facility] at any time to schedule an appointment. The patient now has new insurance and an appointment was scheduled via phone for 07/16/12 with Dr Johna Sheriff...cef

## 2012-07-10 IMAGING — RF DG UGI W/ GASTROGRAFIN
20 of 22 series · 20 of 22 positions shown · IV contrast (agent unspecified)
Comparison: None

CLINICAL DATA: Gastric bypass - postoperative.

WATER SOLUBLE UPPER GI SERIES
TECHNIQUE: Single-column upper GI series was performed using water
soluble contrast.
Fluoroscopy Time: 2.5 minutes
Contrast: 30 ml oral 1mnipaque-VRR

[Series 1: run · 1 of 1 slices shown (1 of 20)]
[im 1/1]
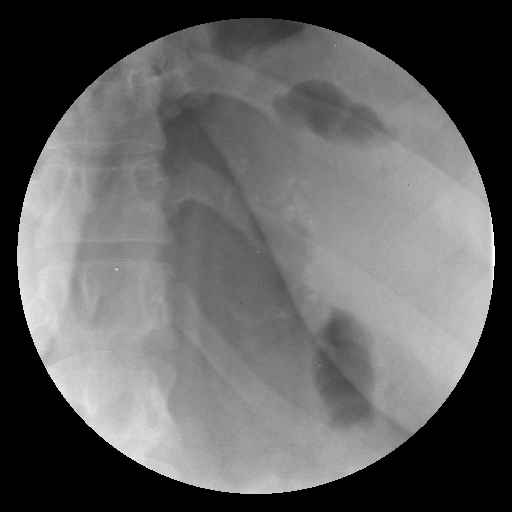

[Series 2: run · 1 of 1 slices shown (2 of 20)]
[im 1/1]
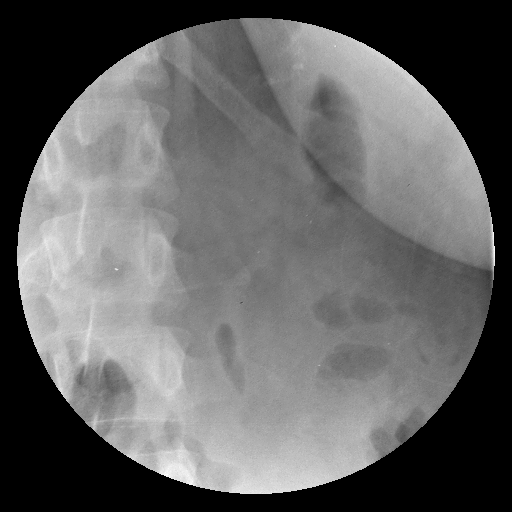

[Series 3: run · 1 of 1 slices shown (3 of 20)]
[im 1/1]
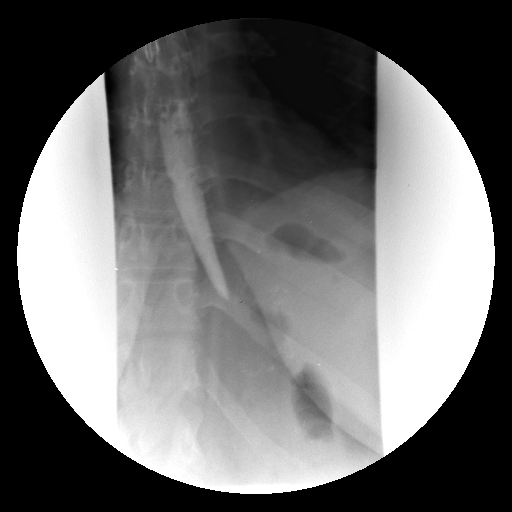

[Series 4: run · 1 of 1 slices shown (4 of 20)]
[im 1/1]
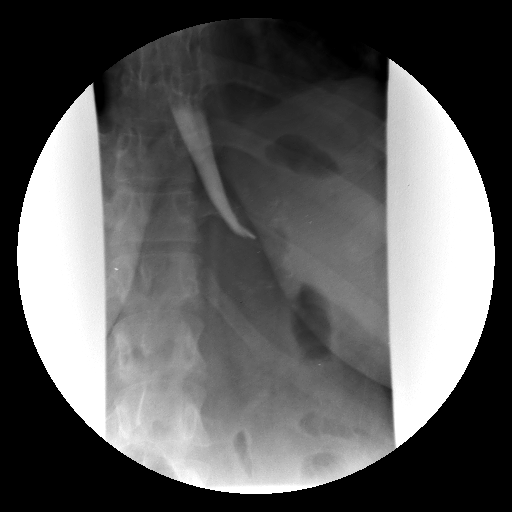

[Series 5: run · 1 of 1 slices shown (5 of 20)]
[im 1/1]
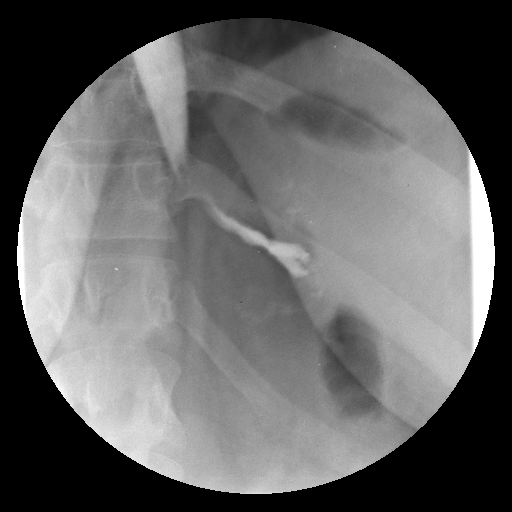

[Series 7: run · 1 of 1 slices shown (6 of 20)]
[im 1/1]
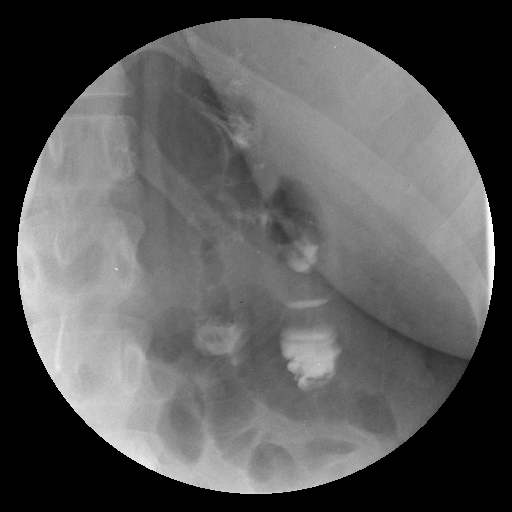

[Series 8: run · 1 of 1 slices shown (7 of 20)]
[im 1/1]
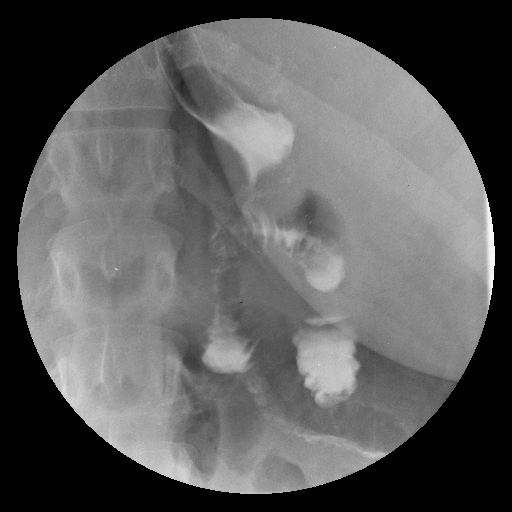

[Series 9: run · 1 of 1 slices shown (8 of 20)]
[im 1/1]
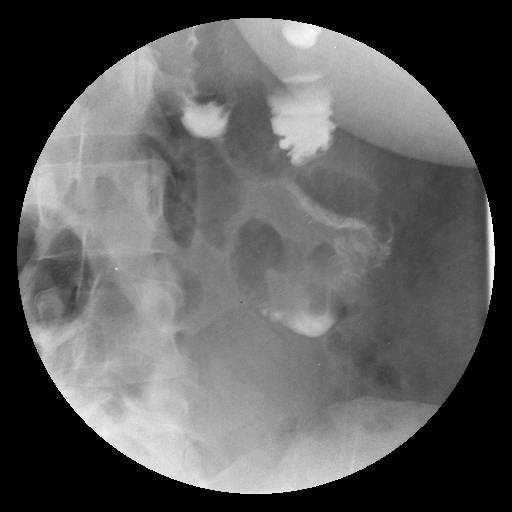

[Series 10: run · 1 of 1 slices shown (9 of 20)]
[im 1/1]
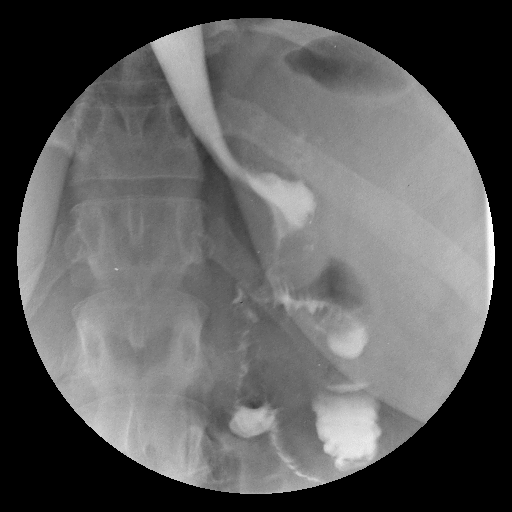

[Series 11: run · 1 of 1 slices shown (10 of 20)]
[im 1/1]
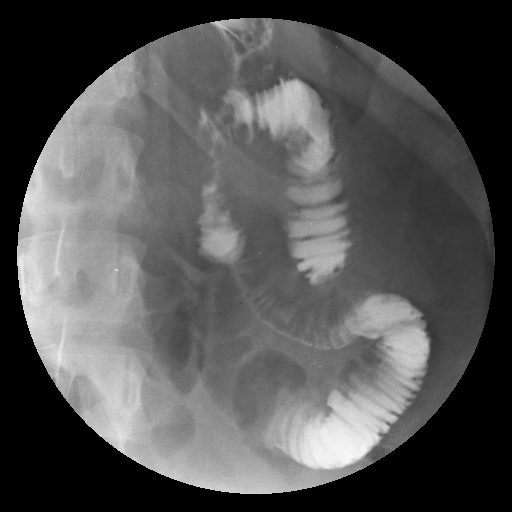

[Series 12: run · 1 of 1 slices shown (11 of 20)]
[im 1/1]
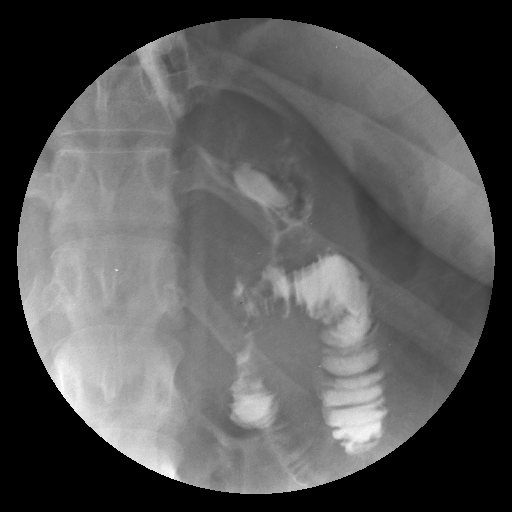

[Series 13: run · 1 of 1 slices shown (12 of 20)]
[im 1/1]
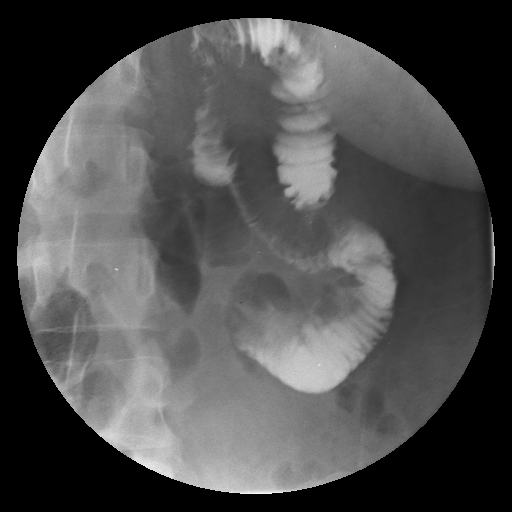

[Series 14: run · 1 of 1 slices shown (13 of 20)]
[im 1/1]
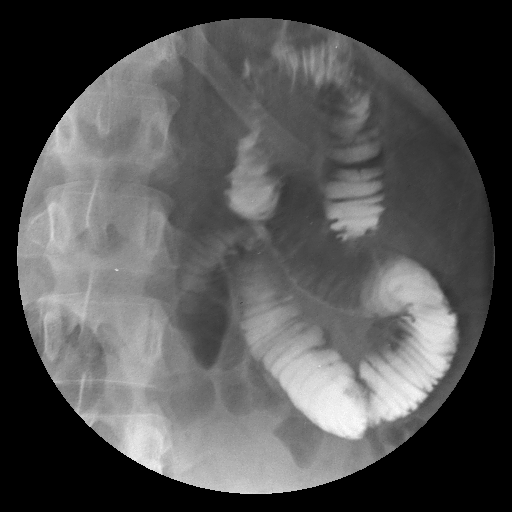

[Series 15: run · 1 of 1 slices shown (14 of 20)]
[im 1/1]
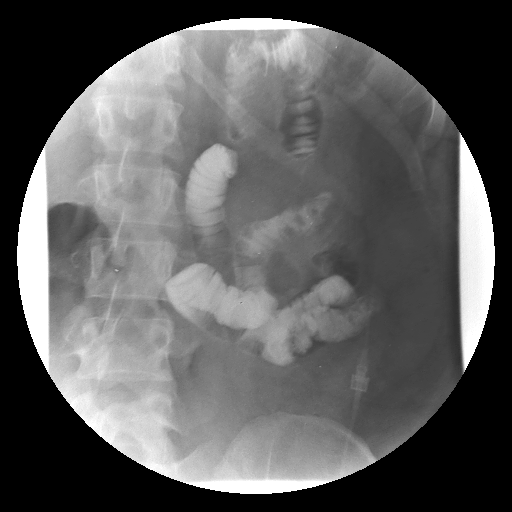

[Series 16: run · 1 of 1 slices shown (15 of 20)]
[im 1/1]
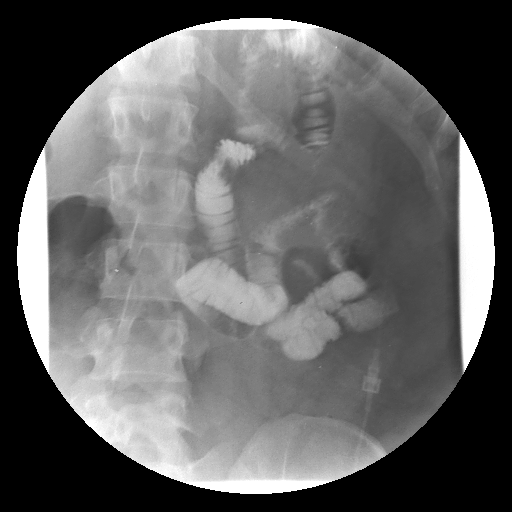

[Series 18: run · 1 of 1 slices shown (16 of 20)]
[im 1/1]
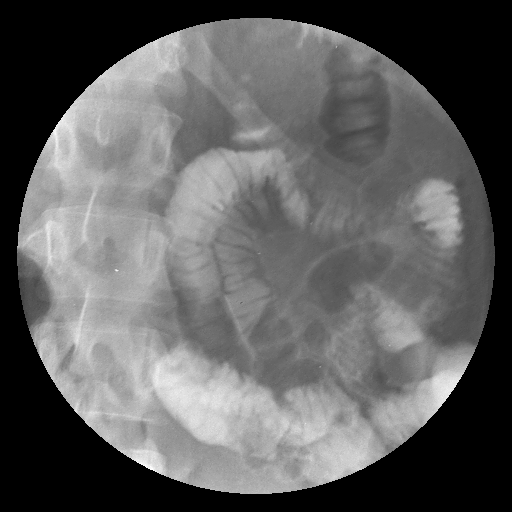

[Series 19: run · 1 of 1 slices shown (17 of 20)]
[im 1/1]
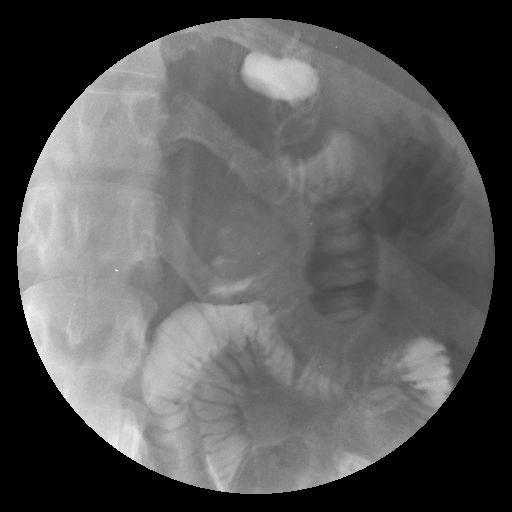

[Series 20: run · 1 of 1 slices shown (18 of 20)]
[im 1/1]
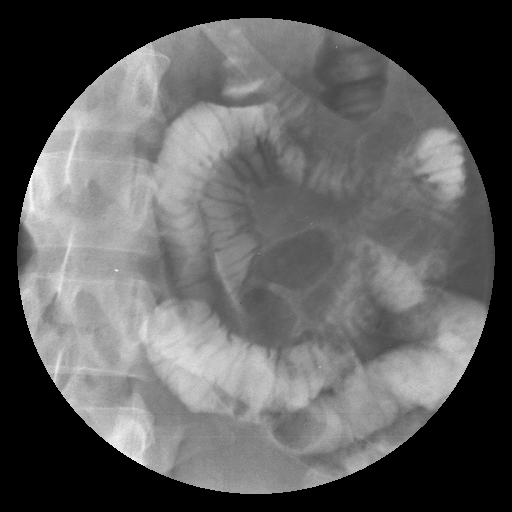

[Series 21: run · 1 of 1 slices shown (19 of 20)]
[im 1/1]
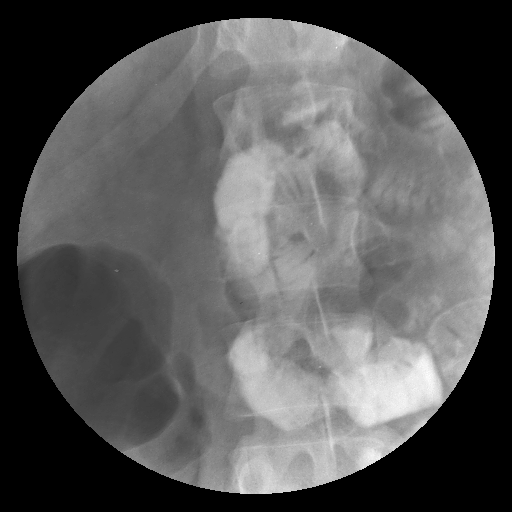

[Series 22: run · 1 of 1 slices shown (20 of 20)]
[im 1/1]
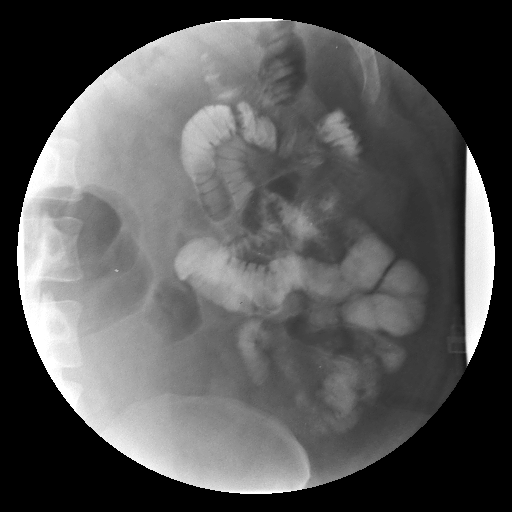

[20 of 22 positions shown; findings below may reference images not displayed]

FINDINGS: A scout film of the abdomen demonstrates upper limits of
normal gas-filled small bowel loops.

The distal esophagus is unremarkable.

Gastric bypass is identified.
There is no evidence of leak or obstruction.
The visualized proximal and mid small bowel are unremarkable.
No complicating features are identified.
IMPRESSION: Gastric bypass without complicating features.

## 2012-07-12 ENCOUNTER — Ambulatory Visit: Payer: 59 | Admitting: Internal Medicine

## 2012-07-13 ENCOUNTER — Encounter: Payer: Self-pay | Admitting: Internal Medicine

## 2012-07-13 ENCOUNTER — Other Ambulatory Visit (INDEPENDENT_AMBULATORY_CARE_PROVIDER_SITE_OTHER): Payer: 59

## 2012-07-13 ENCOUNTER — Ambulatory Visit (INDEPENDENT_AMBULATORY_CARE_PROVIDER_SITE_OTHER): Payer: 59 | Admitting: Internal Medicine

## 2012-07-13 VITALS — BP 132/86 | HR 84 | Temp 98.0°F | Resp 16 | Ht 68.5 in | Wt 164.5 lb

## 2012-07-13 DIAGNOSIS — D529 Folate deficiency anemia, unspecified: Secondary | ICD-10-CM

## 2012-07-13 DIAGNOSIS — D509 Iron deficiency anemia, unspecified: Secondary | ICD-10-CM

## 2012-07-13 DIAGNOSIS — Z23 Encounter for immunization: Secondary | ICD-10-CM

## 2012-07-13 DIAGNOSIS — D519 Vitamin B12 deficiency anemia, unspecified: Secondary | ICD-10-CM

## 2012-07-13 DIAGNOSIS — Z9884 Bariatric surgery status: Secondary | ICD-10-CM

## 2012-07-13 DIAGNOSIS — D518 Other vitamin B12 deficiency anemias: Secondary | ICD-10-CM

## 2012-07-13 LAB — CBC WITH DIFFERENTIAL/PLATELET
Basophils Absolute: 0 10*3/uL (ref 0.0–0.1)
Hemoglobin: 11.3 g/dL — ABNORMAL LOW (ref 12.0–15.0)
Lymphocytes Relative: 41.9 % (ref 12.0–46.0)
Lymphs Abs: 2.3 10*3/uL (ref 0.7–4.0)
MCHC: 32.7 g/dL (ref 30.0–36.0)
MCV: 89.2 fl (ref 78.0–100.0)
Monocytes Relative: 6 % (ref 3.0–12.0)
Neutrophils Relative %: 50.4 % (ref 43.0–77.0)
RBC: 3.86 Mil/uL — ABNORMAL LOW (ref 3.87–5.11)

## 2012-07-13 LAB — TSH: TSH: 1.16 u[IU]/mL (ref 0.35–5.50)

## 2012-07-13 LAB — FOLATE: Folate: 5.8 ng/mL — ABNORMAL LOW (ref >5.9)

## 2012-07-13 LAB — LIPID PANEL
Total CHOL/HDL Ratio: 2
Triglycerides: 74 mg/dL (ref 0.0–149.0)

## 2012-07-13 LAB — VITAMIN B12: Vitamin B-12: 112 pg/mL — ABNORMAL LOW (ref 211–911)

## 2012-07-13 LAB — COMPREHENSIVE METABOLIC PANEL
AST: 18 U/L (ref 0–37)
Alkaline Phosphatase: 50 U/L (ref 39–117)
BUN: 12 mg/dL (ref 6–23)
Creatinine, Ser: 0.8 mg/dL (ref 0.4–1.2)
Potassium: 4 mEq/L (ref 3.5–5.1)
Total Bilirubin: 0.5 mg/dL (ref 0.3–1.2)

## 2012-07-13 LAB — HEMOGLOBIN A1C: Hgb A1c MFr Bld: 5.1 % (ref 4.6–6.5)

## 2012-07-13 LAB — IBC PANEL: Saturation Ratios: 16.8 % — ABNORMAL LOW (ref 20.0–50.0)

## 2012-07-13 NOTE — Assessment & Plan Note (Signed)
I will check her CBC and her vitamin levels today 

## 2012-07-13 NOTE — Patient Instructions (Signed)
Anemia, Nonspecific  Your exam and blood tests show you are anemic. This means your blood (hemoglobin) level is low. Normal hemoglobin values are 12 to 15 g/dL for females and 14 to 17 g/dL for males. Make a note of your hemoglobin level today. The hematocrit percent is also used to measure anemia. A normal hematocrit is 38% to 46% in females and 42% to 49% in males. Make a note of your hematocrit level today.  CAUSES   Anemia can be due to many different causes.   Excessive bleeding from periods (in women).   Intestinal bleeding.   Poor nutrition.   Kidney, thyroid, liver, and bone marrow diseases.  SYMPTOMS   Anemia can come on suddenly (acute). It can also come on slowly. Symptoms can include:   Minor weakness.   Dizziness.   Palpitations.   Shortness of breath.  Symptoms may be absent until half your hemoglobin is missing if it comes on slowly. Anemia due to acute blood loss from an injury or internal bleeding may require blood transfusion if the loss is severe. Hospital care is needed if you are anemic and there is significant continual blood loss.  TREATMENT    Stool tests for blood (Hemoccult) and additional lab tests are often needed. This determines the best treatment.   Further checking on your condition and your response to treatment is very important. It often takes many weeks to correct anemia.  Depending on the cause, treatment can include:   Supplements of iron.   Vitamins B12 and folic acid.   Hormone medicines.If your anemia is due to bleeding, finding the cause of the blood loss is very important. This will help avoid further problems.  SEEK IMMEDIATE MEDICAL CARE IF:    You develop fainting, extreme weakness, shortness of breath, or chest pain.   You develop heavy vaginal bleeding.   You develop bloody or black, tarry stools or vomit up blood.   You develop a high fever, rash, repeated vomiting, or dehydration.  Document Released: 10/02/2004 Document Revised: 11/17/2011 Document  Reviewed: 07/10/2009  [employer] Patient Information 2013 [employer].

## 2012-07-13 NOTE — Assessment & Plan Note (Signed)
I will check her labs today to look for malabsorption and complications

## 2012-07-13 NOTE — Progress Notes (Signed)
  Subjective:    Patient ID: Anna Nguyen, female    DOB: 08-Jun-1986, 26 y.o.   MRN: [id number]  Anemia Presents for follow-up visit. Symptoms include light-headedness, malaise/fatigue and paresthesias (numbness in her toes). There has been no abdominal pain, anorexia, bruising/bleeding easily, confusion, fever, leg swelling, pallor, palpitations, pica or weight loss. Signs of blood loss that are not present include hematemesis, hematochezia, melena, menorrhagia and vaginal bleeding. Compliance problems include psychosocial issues.  Compliance with medications is 0-25%.      Review of Systems  Constitutional: Positive for malaise/fatigue and fatigue. Negative for fever, chills, weight loss, diaphoresis, activity change, appetite change and unexpected weight change.  HENT: Negative.   Eyes: Negative.   Respiratory: Negative for apnea, cough, chest tightness, shortness of breath, wheezing and stridor.   Cardiovascular: Negative for chest pain, palpitations and leg swelling.  Gastrointestinal: Negative for nausea, vomiting, abdominal pain, diarrhea, constipation, blood in stool, melena, hematochezia, anorexia and hematemesis.  Genitourinary: Negative.  Negative for vaginal bleeding and menorrhagia.  Musculoskeletal: Negative for myalgias, back pain, joint swelling, arthralgias and gait problem.  Skin: Negative for color change, pallor, rash and wound.  Neurological: Positive for light-headedness, numbness and paresthesias (numbness in her toes). Negative for dizziness, tremors, seizures, syncope, facial asymmetry, speech difficulty, weakness and headaches.  Hematological: Does not bruise/bleed easily.  Psychiatric/Behavioral: Negative.  Negative for confusion.       Objective:   Physical Exam  Vitals reviewed. Constitutional: She is oriented to person, place, and time. She appears well-developed and well-nourished. No distress.  HENT:  Head: Normocephalic and atraumatic.  Mouth/Throat:  Oropharynx is clear and moist. No oropharyngeal exudate.  Eyes: Conjunctivae normal are normal. Right eye exhibits no discharge. Left eye exhibits no discharge. No scleral icterus.  Neck: Normal range of motion. Neck supple. No JVD present. No tracheal deviation present. No thyromegaly present.  Cardiovascular: Normal rate, regular rhythm, normal heart sounds and intact distal pulses.  Exam reveals no gallop and no friction rub.   No murmur heard. Pulmonary/Chest: Effort normal and breath sounds normal. No stridor. No respiratory distress. She has no wheezes. She has no rales. She exhibits no tenderness.  Abdominal: Soft. Bowel sounds are normal. She exhibits no distension and no mass. There is no tenderness. There is no rebound and no guarding.  Musculoskeletal: Normal range of motion. She exhibits no edema and no tenderness.  Lymphadenopathy:    She has no cervical adenopathy.  Neurological: She is oriented to person, place, and time.  Skin: Skin is warm and dry. No rash noted. She is not diaphoretic. No erythema. No pallor.  Psychiatric: She has a normal mood and affect. Her behavior is normal. Judgment and thought content normal.     Lab Results  Component Value Date   WBC 7.0 05/06/2011   HGB 11.6* 05/06/2011   HCT 36.0 05/06/2011   PLT 200.0 05/06/2011   GLUCOSE 80 03/16/2011   CHOL 127 12/18/2010   TRIG 62.0 12/18/2010   HDL 46.00 12/18/2010   LDLCALC 69 12/18/2010   ALT 67* 02/19/2011   AST 35 02/19/2011   NA 140 03/16/2011   K 3.5 03/16/2011   CL 105 03/16/2011   CREATININE 0.62 03/16/2011   BUN 10 03/16/2011   CO2 26 03/16/2011   TSH 0.83 02/19/2011   HGBA1C 5.5 05/06/2011   MICROALBUR 1.13 10/03/2008      Assessment & Plan:

## 2012-07-14 ENCOUNTER — Encounter: Payer: Self-pay | Admitting: Internal Medicine

## 2012-07-14 DIAGNOSIS — D519 Vitamin B12 deficiency anemia, unspecified: Secondary | ICD-10-CM | POA: Insufficient documentation

## 2012-07-14 DIAGNOSIS — D529 Folate deficiency anemia, unspecified: Secondary | ICD-10-CM | POA: Insufficient documentation

## 2012-07-14 MED ORDER — FOLIC ACID 1 MG PO TABS: 1.0000 mg | ORAL_TABLET | Freq: Every day | ORAL | Status: DC

## 2012-07-14 MED ORDER — CYANOCOBALAMIN 500 MCG/0.1ML NA SOLN: 0.1000 mL | NASAL | Status: DC

## 2012-07-14 NOTE — Assessment & Plan Note (Signed)
-   Start folate 

## 2012-07-14 NOTE — Addendum Note (Signed)
Addended by: Etta Grandchild on: 07/14/2012 09:05 AM   Modules accepted: Orders

## 2012-07-14 NOTE — Assessment & Plan Note (Signed)
Start nascobal ns 

## 2012-07-16 ENCOUNTER — Encounter (INDEPENDENT_AMBULATORY_CARE_PROVIDER_SITE_OTHER): Payer: Self-pay | Admitting: General Surgery

## 2012-07-16 ENCOUNTER — Ambulatory Visit (INDEPENDENT_AMBULATORY_CARE_PROVIDER_SITE_OTHER): Payer: 59 | Admitting: General Surgery

## 2012-07-16 VITALS — BP 135/92 | HR 81 | Temp 98.6°F | Resp 16 | Ht 69.0 in | Wt 164.2 lb

## 2012-07-16 DIAGNOSIS — Z9884 Bariatric surgery status: Secondary | ICD-10-CM

## 2012-07-16 NOTE — Progress Notes (Signed)
Chief complaint: Followup gastric bypass, blackout spells  History: Patient returns for followup approximately one and a half years following laparoscopic Roux-en-Y gastric bypass with previous history of type 2 diabetes insulin-dependent hypertension and polycystic ovarian syndrome. I last saw her in 9 months postop. She missed her followup due to insurance concerns. She was pregnant at her last followup which was terminated. She states that since about April of this year she has had frequent episodes which involve either syncope or presyncope with sweating malaise headaches and tremors. Either episode appears to be resolved quickly by eating hard candy. Her weight has been stable. She has no double pain and is able to tolerate any type of food. Between these episodes she feels well. She is on no medications.  Exam: BP 135/92  Pulse 81  Temp 98.6 F (37 C) (Temporal)  Resp 16  Ht 5\' 9"  (1.753 m)  Wt 164 lb 3.2 oz (74.481 kg)  BMI 24.25 kg/m2  SpO2 99%  LMP 06/25/2012 General: Well-appearing African female HEENT: No masses. Sclera nonicteric Lungs: Clear equal breath sounds bilaterally Cardiac: Regular rate and rhythm. No murmurs. No edema Abdomen: Well-healed incisions. No hernias. No organomegaly Neurologic: Alert fully oriented gait normal.  Laboratory recent lab work obtained by Dr. Yetta Barre on 11 5 shows normal electrolytes and renal function. Glucose was 84. Iron level normal at 70 with slightly low saturation of 16.8. Her folate was minimally low at 5.8 and B12 was low at 112. She is receiving B12 injections.  Assessment and plan: Status post Roux-en-Y gastric bypass. She is having syncopal and near-syncopal episodes that sound typical for hypoglycemia. At this point I encouraged her to try to add complex carbohydrates to her diet throughout the day and avoid simple carbohydrates. We are going to get an appointment back to see the dietitian. There are some rare hyperinsulinemia secreting  syndromes post bypass which I am going to research a little further for her and get back to her on this. I gave her a fallback appointment for 6 months.

## 2012-07-16 NOTE — Patient Instructions (Signed)
And complex carbohydrates such as beans, rice, fruits and vegetables to your diet throughout the day. I will call you back with any further information I can obtain on elevated insulin levels post bypass patients

## 2012-07-21 ENCOUNTER — Other Ambulatory Visit: Payer: Self-pay

## 2012-07-21 MED ORDER — ETONOGESTREL-ETHINYL ESTRADIOL 0.12-0.015 MG/24HR VA RING: VAGINAL_RING | VAGINAL | Status: DC

## 2012-08-26 ENCOUNTER — Ambulatory Visit (INDEPENDENT_AMBULATORY_CARE_PROVIDER_SITE_OTHER): Payer: 59 | Admitting: *Deleted

## 2012-08-26 ENCOUNTER — Telehealth: Payer: Self-pay | Admitting: *Deleted

## 2012-08-26 DIAGNOSIS — D519 Vitamin B12 deficiency anemia, unspecified: Secondary | ICD-10-CM

## 2012-08-26 DIAGNOSIS — D518 Other vitamin B12 deficiency anemias: Secondary | ICD-10-CM

## 2012-08-26 MED ORDER — CYANOCOBALAMIN 1000 MCG/ML IJ SOLN
1000.0000 ug | Freq: Once | INTRAMUSCULAR | Status: AC
  Administered 2012-08-26: 1000 ug via INTRAMUSCULAR

## 2012-08-26 NOTE — Telephone Encounter (Signed)
LA - please help with this 

## 2012-08-26 NOTE — Telephone Encounter (Signed)
Pt came in for b 12 inj. She gave herself the injection and requests Rxs to continue this herself. Please send Rxs to her pharmacy.

## 2012-08-27 MED ORDER — "SYRINGE/NEEDLE (DISP) 25G X 5/8"" 3 ML MISC": Status: DC

## 2012-08-27 MED ORDER — CYANOCOBALAMIN 1000 MCG/ML IJ SOLN: 1000.0000 ug | INTRAMUSCULAR | Status: DC

## 2012-08-27 NOTE — Telephone Encounter (Signed)
Rx for b12 and supplies sent to pharmacy per pt request

## 2012-09-16 ENCOUNTER — Ambulatory Visit (INDEPENDENT_AMBULATORY_CARE_PROVIDER_SITE_OTHER): Payer: 59 | Admitting: *Deleted

## 2012-09-16 DIAGNOSIS — D519 Vitamin B12 deficiency anemia, unspecified: Secondary | ICD-10-CM

## 2012-09-16 DIAGNOSIS — D518 Other vitamin B12 deficiency anemias: Secondary | ICD-10-CM

## 2012-09-16 MED ORDER — CYANOCOBALAMIN 1000 MCG/ML IJ SOLN
1000.0000 ug | Freq: Once | INTRAMUSCULAR | Status: AC
  Administered 2012-09-16: 1000 ug via INTRAMUSCULAR

## 2012-09-30 ENCOUNTER — Ambulatory Visit: Payer: 59

## 2012-10-05 ENCOUNTER — Ambulatory Visit (INDEPENDENT_AMBULATORY_CARE_PROVIDER_SITE_OTHER): Payer: 59 | Admitting: *Deleted

## 2012-10-05 DIAGNOSIS — D509 Iron deficiency anemia, unspecified: Secondary | ICD-10-CM

## 2012-10-05 MED ORDER — CYANOCOBALAMIN 1000 MCG/ML IJ SOLN
1000.0000 ug | Freq: Once | INTRAMUSCULAR | Status: AC
  Administered 2012-10-05: 1000 ug via INTRAMUSCULAR

## 2012-10-05 NOTE — Progress Notes (Signed)
Pt here for nurse visit/ b12 inj. She demonstrated proper technique and administered med herself SQ in lower abdomen.

## 2012-11-03 ENCOUNTER — Ambulatory Visit (INDEPENDENT_AMBULATORY_CARE_PROVIDER_SITE_OTHER): Payer: 59

## 2012-11-03 DIAGNOSIS — D509 Iron deficiency anemia, unspecified: Secondary | ICD-10-CM

## 2012-11-03 MED ORDER — CYANOCOBALAMIN 1000 MCG/ML IJ SOLN
1000.0000 ug | Freq: Once | INTRAMUSCULAR | Status: AC
  Administered 2012-11-03: 1000 ug via INTRAMUSCULAR

## 2012-11-08 ENCOUNTER — Ambulatory Visit (INDEPENDENT_AMBULATORY_CARE_PROVIDER_SITE_OTHER): Payer: 59 | Admitting: Internal Medicine

## 2012-11-08 ENCOUNTER — Encounter: Payer: Self-pay | Admitting: Internal Medicine

## 2012-11-08 VITALS — BP 128/76 | HR 124 | Temp 102.9°F

## 2012-11-08 DIAGNOSIS — N949 Unspecified condition associated with female genital organs and menstrual cycle: Secondary | ICD-10-CM

## 2012-11-08 DIAGNOSIS — R102 Pelvic and perineal pain: Secondary | ICD-10-CM

## 2012-11-08 DIAGNOSIS — J111 Influenza due to unidentified influenza virus with other respiratory manifestations: Secondary | ICD-10-CM

## 2012-11-08 LAB — POCT URINE PREGNANCY: Preg Test, Ur: NEGATIVE

## 2012-11-08 MED ORDER — OSELTAMIVIR PHOSPHATE 75 MG PO CAPS: 75.0000 mg | ORAL_CAPSULE | Freq: Two times a day (BID) | ORAL | Status: DC

## 2012-11-08 NOTE — Progress Notes (Signed)
HPI  Pt presents to the clinic today with c/o cold symptoms x 2 days. This illness came on her very quickly. It started with body aches, chills and cough. She has taken tylenol without relief. She has had her flu shot. She denies nausea, vomiting or diarrhea. She feels very weak. She has been forcing herself to eat because she is prone to hypoglycemia. She has had sick contacts. Additionally, she c/o pelvic pain and pain with intercourse. This is a new problem for her. She has not had a normal period in 2 months. She is on the [employer]. The pain is descried as crampy. She has never experienced pain like this in the past. She denies vaginal discharge. She has been pregnant in the past while on birth control.  Review of Systems      Past Medical History  Diagnosis Date  . Anemia, iron deficiency   . Type II or unspecified type diabetes mellitus without mention of complication, not stated as uncontrolled   . HTN (hypertension)   . PCOS (polycystic ovarian syndrome)   . Pregnant state, incidental     Family History  Problem Relation Age of Onset  . Arthritis Other   . Diabetes Other   . Hypertension Other   . Hyperlipidemia Other   . Prostate cancer Other   . Cancer Maternal Aunt     breast    History   Social History  . Marital Status: Single    Spouse Name: N/A    Number of Children: N/A  . Years of Education: N/A   Occupational History  . SALES     [employer]   Social History Main Topics  . Smoking status: Never Smoker   . Smokeless tobacco: Never Used  . Alcohol Use: No  . Drug Use: No  . Sexually Active: Yes    Birth Control/ Protection: IUD   Other Topics Concern  . Not on file   Social History Narrative   Regular Exercise-yes          Allergies  Allergen Reactions  . Aspirin     Per surgeon  . Nsaids     Per Careers adviser.     Constitutional: Positive headache, fatigue and fever. Denies abrupt weight changes.  HEENT:  Positive sore throat. Denies eye  redness, eye pain, pressure behind the eyes, facial pain, nasal congestion, ear pain, ringing in the ears, wax buildup, runny nose or bloody nose. Respiratory: Positive cough. Denies difficulty breathing or shortness of breath.  Cardiovascular: Denies chest pain, chest tightness, palpitations or swelling in the hands or feet.   No other specific complaints in a complete review of systems (except as listed in HPI above).  Objective:   BP 128/76  Pulse 124  Temp(Src) 102.9 F (39.4 C) (Oral)  SpO2 98% Wt Readings from Last 3 Encounters:  07/16/12 164 lb 3.2 oz (74.481 kg)  07/13/12 164 lb 8 oz (74.617 kg)  08/15/11 173 lb (78.472 kg)     General: Appears her stated age, well developed, well nourished in NAD. HEENT: Head: normal shape and size; Eyes: sclera white, no icterus, conjunctiva pink, PERRLA and EOMs intact; Ears: Tm's gray and intact, normal light reflex; Nose: mucosa pink and moist, septum midline; Throat/Mouth: + PND. Teeth present, mucosa erythematous and moist, no exudate noted, no lesions or ulcerations noted.  Neck: Mild cervical lymphadenopathy. Neck supple, trachea midline. No massses, lumps or thyromegaly present.  Cardiovascular: Normal rate and rhythm. S1,S2 noted.  No murmur, rubs  or gallops noted. No JVD or BLE edema. No carotid bruits noted. Pulmonary/Chest: Normal effort and positive vesicular breath sounds. No respiratory distress. No wheezes, rales or ronchi noted.      Assessment & Plan:   Viral Infection, most likely influenza, new onset, no additional workup required:  Get some rest and drink plenty of water Tylenol for fever and body aches eRx for Tamiflu 75 mg BID x 5 days Pt declines RX for zofran at this time  Pelvic pain, new onset with additional workup required:  Urine HCG to r/o pregnancy- negative If pain continues without discharge, pt will need pelvic exam and possibly ultrasound Pelvic ultrasound ordered  RTC as needed or if symptoms  persist.

## 2012-11-08 NOTE — Patient Instructions (Signed)
Influenza Facts  Flu (influenza) is a contagious respiratory illness caused by the influenza viruses. It can cause mild to severe illness. While most healthy people recover from the flu without specific treatment and without complications, older people, young children, and people with certain health conditions are at higher risk for serious complications from the flu, including death.  CAUSES    The flu virus is spread from person to person by respiratory droplets from coughing and sneezing.   A person can also become infected by touching an object or surface with a virus on it and then touching their mouth, eye or nose.   Adults may be able to infect others from 1 day before symptoms occur and up to 7 days after getting sick. So it is possible to give someone the flu even before you know you are sick and continue to infect others while you are sick.  SYMPTOMS    Fever (usually high).   Headache.   Tiredness (can be extreme).   Cough.   Sore throat.   Runny or stuffy nose.   Body aches.   Diarrhea and vomiting may also occur, particularly in children.   These symptoms are referred to as "flu-like symptoms". A lot of different illnesses, including the common cold, can have similar symptoms.  DIAGNOSIS    There are tests that can determine if you have the flu as long you are tested within the first 2 or 3 days of illness.   A doctor's exam and additional tests may be needed to identify if you have a disease that is a complicating the flu.  RISKS AND COMPLICATIONS   Some of the complications caused by the flu include:   Bacterial pneumonia or progressive pneumonia caused by the flu virus.   Loss of body fluids (dehydration).   Worsening of chronic medical conditions, such as heart failure, asthma, or diabetes.   Sinus problems and ear infections.  HOME CARE INSTRUCTIONS    Seek medical care early on.   If you are at high risk from complications of the flu, consult your health-care provider as soon  as you develop flu-like symptoms. Those at high risk for complications include:   People 65 years or older.   People with chronic medical conditions, including diabetes.   Pregnant women.   Young children.   Your caregiver may recommend use of an antiviral medication to help treat the flu.   If you get the flu, get plenty of rest, drink a lot of liquids, and avoid using alcohol and tobacco.   You can take over-the-counter medications to relieve the symptoms of the flu if your caregiver approves. (Never give aspirin to children or teenagers who have flu-like symptoms, particularly fever).  PREVENTION   The single best way to prevent the flu is to get a flu vaccine each fall. Other measures that can help protect against the flu are:   Antiviral Medications   A number of antiviral drugs are approved for use in preventing the flu. These are prescription medications, and a doctor should be consulted before they are used.   Habits for Good Health   Cover your nose and mouth with a tissue when you cough or sneeze, throw the tissue away after you use it.   Wash your hands often with soap and water, especially after you cough or sneeze. If you are not near water, use an alcohol-based hand cleaner.   Avoid people who are sick.   If you get the   flu, stay home from work or school. Avoid contact with other people so that you do not make them sick, too.   Try not to touch your eyes, nose, or mouth as germs ore often spread this way.  IN CHILDREN, EMERGENCY WARNING SIGNS THAT NEED URGENT MEDICAL ATTENTION:   Fast breathing or trouble breathing.   Bluish skin color.   Not drinking enough fluids.   Not waking up or not interacting.   Being so irritable that the child does not want to be held.   Flu-like symptoms improve but then return with fever and worse cough.   Fever with a rash.  IN ADULTS, EMERGENCY WARNING SIGNS THAT NEED URGENT MEDICAL ATTENTION:   Difficulty breathing or shortness of breath.   Pain  or pressure in the chest or abdomen.   Sudden dizziness.   Confusion.   Severe or persistent vomiting.  SEEK IMMEDIATE MEDICAL CARE IF:   You or someone you know is experiencing any of the symptoms above. When you arrive at the emergency center,report that you think you have the flu. You may be asked to wear a mask and/or sit in a secluded area to protect others from getting sick.  MAKE SURE YOU:    Understand these instructions.   Monitor your condition.   Seek medical care if you are getting worse, or not improving.  Document Released: 08/28/2003 Document Revised: 11/17/2011 Document Reviewed: 05/24/2009  [employer] Patient Information 2013 [employer].

## 2012-11-10 ENCOUNTER — Telehealth: Payer: Self-pay | Admitting: Internal Medicine

## 2012-11-10 NOTE — Telephone Encounter (Signed)
Patient Information:  Caller Name: Raeleen  Phone: [phone]  Patient: Anna Nguyen, Anna Nguyen  Gender: Female  DOB: 05/07/1986  Age: 27 Years  PCP: Sanda Linger (Adults only)  Pregnant: No  Office Follow Up:  Does the office need to follow up with this patient?: No  Instructions For The Office: N/A   Symptoms  Reason For Call & Symptoms: Was in office on 11/08/12 and diagnosed with flu, no flu test was done, but was prescribed Tamiflu.  Calling today because chest congestion has not gotten better.  No worse when she was seen. Wants to know if that is ok.  Reviewed Health History In EMR: Yes  Reviewed Medications In EMR: Yes  Reviewed Allergies In EMR: Yes  Reviewed Surgeries / Procedures: Yes  Date of Onset of Symptoms: 11/06/2012  Treatments Tried: Tamiflu, Tylenol  Treatments Tried Worked: No OB / GYN:  LMP: 10/16/2012  Guideline(s) Used:  Influenza Follow-Up Call  Disposition Per Guideline:   Home Care  Reason For Disposition Reached:   Influenza (diagnosed by HCP) and no complications  Advice Given:  Reassurance  Here is some care advice that should help.  Influenza - Treating the Symptoms:  Feeling dehydrated: Drink extra liquids. If the air in your home is dry, use a humidifier.  Call Back If  Difficulty breathing  Fever lasts more than 3 days  Cough lasts more than 3 weeks  You become worse.

## 2012-11-11 ENCOUNTER — Other Ambulatory Visit: Payer: Self-pay | Admitting: Internal Medicine

## 2012-11-11 DIAGNOSIS — R102 Pelvic and perineal pain: Secondary | ICD-10-CM

## 2012-11-16 ENCOUNTER — Other Ambulatory Visit (INDEPENDENT_AMBULATORY_CARE_PROVIDER_SITE_OTHER): Payer: 59

## 2012-11-16 ENCOUNTER — Encounter: Payer: Self-pay | Admitting: Internal Medicine

## 2012-11-16 ENCOUNTER — Ambulatory Visit (INDEPENDENT_AMBULATORY_CARE_PROVIDER_SITE_OTHER): Payer: 59 | Admitting: Internal Medicine

## 2012-11-16 VITALS — BP 128/84 | HR 98 | Temp 98.7°F | Ht 69.0 in | Wt 181.0 lb

## 2012-11-16 DIAGNOSIS — R1032 Left lower quadrant pain: Secondary | ICD-10-CM

## 2012-11-16 LAB — CBC
MCHC: 33.2 g/dL (ref 30.0–36.0)
RDW: 13 % (ref 11.5–14.6)

## 2012-11-16 LAB — BASIC METABOLIC PANEL
BUN: 11 mg/dL (ref 6–23)
CO2: 27 mEq/L (ref 19–32)
Chloride: 104 mEq/L (ref 96–112)
Glucose, Bld: 93 mg/dL (ref 70–99)
Potassium: 4 mEq/L (ref 3.5–5.1)
Sodium: 137 mEq/L (ref 135–145)

## 2012-11-16 MED ORDER — HYDROCODONE-ACETAMINOPHEN 5-325 MG PO TABS: 1.0000 | ORAL_TABLET | Freq: Four times a day (QID) | ORAL | Status: DC | PRN

## 2012-11-16 NOTE — Progress Notes (Signed)
Subjective:    Patient ID: Anna Nguyen, female    DOB: 20-Feb-1986, 27 y.o.   MRN: [id number]  HPI  Pt presents to the clinic today with c/o severe left sided pain. This started this morning at 5 am. The pain starts in her left lower abdomen and extends up to her ribs. The pain is constant 10/10. She has taken simethicone for it because she thought it was gas but that did not relieve the pain. She also had a normal BM this morning which did not relieve the pain. She is unable to lay down or stand up without it hurting. She has had some associated nausea. She denies nausea, vomiting, diarrhea or blood in stool. She has never had pain like this before. She does have a ultrasound scheduled for tomorrow to evaluate ovarian cyst. She does not feel like the pain is coming from the cyst.   Review of Systems  Past Medical History  Diagnosis Date  . Anemia, iron deficiency   . Type II or unspecified type diabetes mellitus without mention of complication, not stated as uncontrolled   . HTN (hypertension)   . PCOS (polycystic ovarian syndrome)   . Pregnant state, incidental     Current Outpatient Prescriptions  Medication Sig Dispense Refill  . cyanocobalamin (,VITAMIN B-12,) 1000 MCG/ML injection Inject 1 mL (1,000 mcg total) into the muscle every 30 (thirty) days.  10 mL  1  . etonogestrel-ethinyl estradiol (NUVARING) 0.12-0.015 MG/24HR vaginal ring Insert vaginally and leave in place for 3 consecutive weeks, then remove for 1 week.  3 each  2  . folic acid (FOLVITE) 1 MG tablet Take 1 tablet (1 mg total) by mouth daily.  90 tablet  3  . SYRINGE-NEEDLE, DISP, 3 ML (BD ECLIPSE SYRINGE) 25G X 5/8" 3 ML MISC Use once monthly with B12 injection  12 each  0  . HYDROcodone-acetaminophen (NORCO/VICODIN) 5-325 MG per tablet Take 1 tablet by mouth every 6 (six) hours as needed for pain.  30 tablet  0   No current facility-administered medications for this visit.    Allergies  Allergen Reactions  .  Aspirin     Per surgeon  . Nsaids     Per Careers adviser.    Family History  Problem Relation Age of Onset  . Arthritis Other   . Diabetes Other   . Hypertension Other   . Hyperlipidemia Other   . Prostate cancer Other   . Cancer Maternal Aunt     breast    History   Social History  . Marital Status: Single    Spouse Name: N/A    Number of Children: N/A  . Years of Education: N/A   Occupational History  . SALES     [employer]   Social History Main Topics  . Smoking status: Never Smoker   . Smokeless tobacco: Never Used  . Alcohol Use: No  . Drug Use: No  . Sexually Active: Yes    Birth Control/ Protection: IUD   Other Topics Concern  . Not on file   Social History Narrative   Regular Exercise-yes           Constitutional: Denies fever, malaise, fatigue, headache or abrupt weight changes.  Respiratory: Denies difficulty breathing, shortness of breath, cough or sputum production.   Cardiovascular: Denies chest pain, chest tightness, palpitations or swelling in the hands or feet.  Gastrointestinal: Pt reports severe abdominal pain. Denies bloating, constipation, diarrhea or blood in the stool.  GU:  Denies urgency, frequency, pain with urination, burning sensation, blood in urine, odor or discharge. Musculoskeletal: Denies decrease in range of motion, difficulty with gait, muscle pain or joint pain and swelling.    No other specific complaints in a complete review of systems (except as listed in HPI above).     Objective:   Physical Exam   BP 128/84  Pulse 98  Temp(Src) 98.7 F (37.1 C) (Oral)  Ht 5\' 9"  (1.753 m)  Wt 181 lb (82.101 kg)  BMI 26.72 kg/m2  SpO2 97% Wt Readings from Last 3 Encounters:  11/16/12 181 lb (82.101 kg)  07/16/12 164 lb 3.2 oz (74.481 kg)  07/13/12 164 lb 8 oz (74.617 kg)    General: Appears her stated age, well developed, well nourished in NAD.  Cardiovascular: Normal rate and rhythm. S1,S2 noted.  No murmur, rubs or gallops noted. No  JVD or BLE edema. No carotid bruits noted. Pulmonary/Chest: Normal effort and positive vesicular breath sounds. No respiratory distress. No wheezes, rales or ronchi noted.  Abdomen: Soft. Tender in the LLQ. Pt guarding. Normal bowel sounds, no bruits noted. No distention or masses noted. Liver, spleen and kidneys non palpable. Musculoskeletal: Normal range of motion. No signs of joint swelling. No difficulty with gait.        Assessment & Plan:   Abdominal pain, LLQ radiating up, new onset with additional workup required:  Will obtain cbc, bmet, lipase, amylase Will obtain stat ct scan of abdomen to r/o left side kidney stones  Will f/u after lab results and ct scan results

## 2012-11-16 NOTE — Patient Instructions (Signed)
Abdominal Pain  Abdominal pain can be caused by many things. Your caregiver decides the seriousness of your pain by an examination and possibly blood tests and X-rays. Many cases can be observed and treated at home. Most abdominal pain is not caused by a disease and will probably improve without treatment. However, in many cases, more time must pass before a clear cause of the pain can be found. Before that point, it may not be known if you need more testing, or if hospitalization or surgery is needed.  HOME CARE INSTRUCTIONS   · Do not take laxatives unless directed by your caregiver.  · Take pain medicine only as directed by your caregiver.  · Only take over-the-counter or prescription medicines for pain, discomfort, or fever as directed by your caregiver.  · Try a clear liquid diet (broth, tea, or water) for as long as directed by your caregiver. Slowly move to a bland diet as tolerated.  SEEK IMMEDIATE MEDICAL CARE IF:   · The pain does not go away.  · You have a fever.  · You keep throwing up (vomiting).  · The pain is felt only in portions of the abdomen. Pain in the right side could possibly be appendicitis. In an adult, pain in the left lower portion of the abdomen could be colitis or diverticulitis.  · You pass bloody or black tarry stools.  MAKE SURE YOU:   · Understand these instructions.  · Will watch your condition.  · Will get help right away if you are not doing well or get worse.  Document Released: 06/04/2005 Document Revised: 11/17/2011 Document Reviewed: 04/12/2008  ExitCare® Patient Information ©2013 [employer].

## 2012-11-17 ENCOUNTER — Ambulatory Visit (INDEPENDENT_AMBULATORY_CARE_PROVIDER_SITE_OTHER)
Admission: RE | Admit: 2012-11-17 | Discharge: 2012-11-17 | Disposition: A | Discharge status: Home / Self Care | Payer: 59 | Source: Ambulatory Visit | Attending: Internal Medicine | Admitting: Internal Medicine

## 2012-11-17 ENCOUNTER — Ambulatory Visit
Admission: RE | Admit: 2012-11-17 | Discharge: 2012-11-17 | Disposition: A | Discharge status: Home / Self Care | Payer: 59 | Source: Ambulatory Visit | Attending: Internal Medicine | Admitting: Internal Medicine

## 2012-11-17 ENCOUNTER — Telehealth: Payer: Self-pay | Admitting: Internal Medicine

## 2012-11-17 DIAGNOSIS — R1032 Left lower quadrant pain: Secondary | ICD-10-CM

## 2012-11-17 DIAGNOSIS — R102 Pelvic and perineal pain: Secondary | ICD-10-CM

## 2012-11-17 NOTE — Telephone Encounter (Signed)
Pt informed of lab results. 

## 2012-11-17 NOTE — Telephone Encounter (Signed)
The patient called the triage line and is hoping information on her lab results. Her callback number is [phone]

## 2012-12-01 ENCOUNTER — Ambulatory Visit (INDEPENDENT_AMBULATORY_CARE_PROVIDER_SITE_OTHER): Payer: 59 | Admitting: *Deleted

## 2012-12-01 ENCOUNTER — Encounter (INDEPENDENT_AMBULATORY_CARE_PROVIDER_SITE_OTHER): Payer: Self-pay | Admitting: General Surgery

## 2012-12-01 DIAGNOSIS — D519 Vitamin B12 deficiency anemia, unspecified: Secondary | ICD-10-CM

## 2012-12-01 DIAGNOSIS — D518 Other vitamin B12 deficiency anemias: Secondary | ICD-10-CM

## 2012-12-01 MED ORDER — CYANOCOBALAMIN 1000 MCG/ML IJ SOLN
1000.0000 ug | Freq: Once | INTRAMUSCULAR | Status: AC
  Administered 2012-12-01: 1000 ug via INTRAMUSCULAR

## 2013-02-16 LAB — HM PAP SMEAR: HM Pap smear: NORMAL

## 2013-02-28 ENCOUNTER — Ambulatory Visit (INDEPENDENT_AMBULATORY_CARE_PROVIDER_SITE_OTHER): Payer: 59 | Admitting: Internal Medicine

## 2013-02-28 ENCOUNTER — Encounter: Payer: Self-pay | Admitting: Internal Medicine

## 2013-02-28 VITALS — BP 100/70 | HR 89 | Temp 98.5°F | Resp 16 | Ht 68.5 in | Wt 193.8 lb

## 2013-02-28 DIAGNOSIS — R1032 Left lower quadrant pain: Secondary | ICD-10-CM

## 2013-02-28 DIAGNOSIS — M25559 Pain in unspecified hip: Secondary | ICD-10-CM

## 2013-02-28 DIAGNOSIS — E282 Polycystic ovarian syndrome: Secondary | ICD-10-CM | POA: Insufficient documentation

## 2013-02-28 MED ORDER — HYDROCODONE-ACETAMINOPHEN 5-325 MG PO TABS: 1.0000 | ORAL_TABLET | Freq: Four times a day (QID) | ORAL | Status: DC | PRN

## 2013-02-28 MED ORDER — METFORMIN HCL ER 750 MG PO TB24: 750.0000 mg | ORAL_TABLET | Freq: Every day | ORAL | Status: DC

## 2013-02-28 NOTE — Progress Notes (Signed)
Subjective:    Patient ID: Anna Nguyen, female    DOB: 04-23-1986, 27 y.o.   MRN: [id number]  Abdominal Pain This is a recurrent problem. The current episode started more than 1 year ago. The onset quality is gradual. The problem occurs intermittently. The problem has been unchanged. The pain is located in the LLQ and RLQ. The pain is at a severity of 3/10. The pain is mild. The quality of the pain is sharp. The abdominal pain does not radiate. Pertinent negatives include no anorexia, arthralgias, belching, constipation, diarrhea, dysuria, fever, flatus, frequency, headaches, hematochezia, hematuria, melena, myalgias, nausea, vomiting or weight loss. Exacerbated by: menstruation. She has tried oral narcotic analgesics for the symptoms. The treatment provided moderate relief. Prior diagnostic workup includes CT scan and ultrasound. Her past medical history is significant for abdominal surgery. There is no history of colon cancer, Crohn's disease, gallstones, GERD, irritable bowel syndrome, pancreatitis, PUD or ulcerative colitis.      Review of Systems  Constitutional: Negative.  Negative for fever, chills, weight loss, diaphoresis, activity change, appetite change, fatigue and unexpected weight change.  HENT: Negative.   Eyes: Negative.   Respiratory: Negative.  Negative for cough, chest tightness, shortness of breath, wheezing and stridor.   Cardiovascular: Negative.  Negative for chest pain, palpitations and leg swelling.  Gastrointestinal: Positive for abdominal pain. Negative for nausea, vomiting, diarrhea, constipation, melena, hematochezia, abdominal distention, anal bleeding, rectal pain, anorexia and flatus.  Endocrine: Negative.   Genitourinary: Positive for pelvic pain. Negative for dysuria, urgency, frequency, hematuria, flank pain, decreased urine volume, vaginal bleeding, vaginal discharge, difficulty urinating, vaginal pain and dyspareunia.  Musculoskeletal: Negative.  Negative  for myalgias and arthralgias.  Skin: Negative.   Allergic/Immunologic: Negative.   Neurological: Negative.  Negative for headaches.  Hematological: Negative.  Negative for adenopathy. Does not bruise/bleed easily.  Psychiatric/Behavioral: Negative.        Objective:   Physical Exam  Constitutional: She is oriented to person, place, and time. She appears well-developed and well-nourished. No distress.  HENT:  Head: Normocephalic and atraumatic.  Mouth/Throat: Oropharynx is clear and moist. No oropharyngeal exudate.  Eyes: Conjunctivae are normal. Right eye exhibits no discharge. Left eye exhibits no discharge. No scleral icterus.  Neck: Normal range of motion. Neck supple. No JVD present. No tracheal deviation present. No thyromegaly present.  Cardiovascular: Normal rate, regular rhythm, normal heart sounds and intact distal pulses.  Exam reveals no gallop and no friction rub.   No murmur heard. Pulmonary/Chest: Effort normal and breath sounds normal. No stridor. No respiratory distress. She has no wheezes. She has no rales. She exhibits no tenderness.  Abdominal: Soft. Normal appearance and bowel sounds are normal. She exhibits no distension, no abdominal bruit, no ascites, no pulsatile midline mass and no mass. There is no hepatosplenomegaly. There is no tenderness. There is no rebound, no guarding and no CVA tenderness. No hernia. Hernia confirmed negative in the ventral area, confirmed negative in the right inguinal area and confirmed negative in the left inguinal area.  Musculoskeletal: Normal range of motion. She exhibits no edema and no tenderness.  Lymphadenopathy:    She has no cervical adenopathy.  Neurological: She is oriented to person, place, and time.  Skin: Skin is warm and dry. No rash noted. She is not diaphoretic. No erythema. No pallor.  Psychiatric: She has a normal mood and affect. Her behavior is normal. Judgment and thought content normal.      Lab Results   Component Value  Date   WBC 6.6 11/16/2012   HGB 11.1* 11/16/2012   HCT 33.5* 11/16/2012   PLT 236.0 11/16/2012   GLUCOSE 93 11/16/2012   CHOL 149 07/13/2012   TRIG 74.0 07/13/2012   HDL 75.90 07/13/2012   LDLCALC 58 07/13/2012   ALT 20 07/13/2012   AST 18 07/13/2012   NA 137 11/16/2012   K 4.0 11/16/2012   CL 104 11/16/2012   CREATININE 0.7 11/16/2012   BUN 11 11/16/2012   CO2 27 11/16/2012   TSH 1.16 07/13/2012   HGBA1C 5.1 07/13/2012   MICROALBUR 1.13 10/03/2008       Assessment & Plan:

## 2013-02-28 NOTE — Patient Instructions (Signed)
Polycystic Ovarian Syndrome  Polycystic ovarian syndrome is a condition with a number of problems. One problem is with the ovaries. The ovaries are organs located in the female pelvis, on each side of the uterus. Usually, during the menstrual cycle, an egg is released from 1 ovary every month. This is called ovulation. When the egg is fertilized, it goes into the womb (uterus), which allows for the growth of a baby. The egg travels from the ovary through the fallopian tube to the uterus. The ovaries also make the hormones estrogen and progesterone. These hormones help the development of a woman's breasts, body shape, and body hair. They also regulate the menstrual cycle and pregnancy.  Sometimes, cysts form in the ovaries. A cyst is a fluid-filled sac. On the ovary, different types of cysts can form. The most common type of ovarian cyst is called a functional or ovulation cyst. It is normal, and often forms during the normal menstrual cycle. Each month, a woman's ovaries grow tiny cysts that hold the eggs. When an egg is fully grown, the sac breaks open. This releases the egg. Then, the sac which released the egg from the ovary dissolves. In one type of functional cyst, called a follicle cyst, the sac does not break open to release the egg. It may actually continue to grow. This type of cyst usually disappears within 1 to 3 months.   One type of cyst problem with the ovaries is called Polycystic Ovarian Syndrome (PCOS). In this condition, many follicle cysts form, but do not rupture and produce an egg. This health problem can affect the following:  · Menstrual cycle.  · Heart.  · Obesity.  · Cancer of the uterus.  · Fertility.  · Blood vessels.  · Hair growth (face and body) or baldness.  · Hormones.  · Appearance.  · High blood pressure.  · Stroke.  · Insulin production.  · Inflammation of the liver.  · Elevated blood cholesterol and triglycerides.  CAUSES   · No one knows the exact cause of PCOS.  · Women with  PCOS often have a mother or sister with PCOS. There is not yet enough proof to say this is inherited.  · Many women with PCOS have a weight problem.  · Researchers are looking at the relationship between PCOS and the body's ability to make insulin. Insulin is a hormone that regulates the change of sugar, starches, and other food into energy for the body's use, or for storage. Some women with PCOS make too much insulin. It is possible that the ovaries react by making too many female hormones, called androgens. This can lead to acne, excessive hair growth, weight gain, and ovulation problems.  · Too much production of luteinizing hormone (LH) from the pituitary gland in the brain stimulates the ovary to produce too much female hormone (androgen).  SYMPTOMS   · Infrequent or no menstrual periods, and/or irregular bleeding.  · Inability to get pregnant (infertility), because of not ovulating.  · Increased growth of hair on the face, chest, stomach, back, thumbs, thighs, or toes.  · Acne, oily skin, or dandruff.  · Pelvic pain.  · Weight gain or obesity, usually carrying extra weight around the waist.  · Type 2 diabetes (this is the diabetes that usually does not need insulin).  · High cholesterol.  · High blood pressure.  · Female-pattern baldness or thinning hair.  · Patches of thickened and dark brown or black skin on the neck, arms, breasts,   or thighs.  · Skin tags, or tiny excess flaps of skin, in the armpits or neck area.  · Sleep apnea (excessive snoring and breathing stops at times while asleep).  · Deepening of the voice.  · Gestational diabetes when pregnant.  · Increased risk of miscarriage with pregnancy.  DIAGNOSIS   There is no single test to diagnose PCOS.   · Your caregiver will:  · Take a medical history.  · Perform a pelvic exam.  · Perform an ultrasound.  · Check your female and female hormone levels.  · Measure glucose or sugar levels in the blood.  · Do other blood tests.  · If you are producing too many  female hormones, your caregiver will make sure it is from PCOS. At the physical exam, your caregiver will want to evaluate the areas of increased hair growth. Try to allow natural hair growth for a few days before the visit.  · During a pelvic exam, the ovaries may be enlarged or swollen by the increased number of small cysts. This can be seen more easily by vaginal ultrasound or screening, to examine the ovaries and lining of the uterus (endometrium) for cysts. The uterine lining may become thicker, if there has not been a regular period.  TREATMENT   Because there is no cure for PCOS, it needs to be managed to prevent problems. Treatments are based on your symptoms. Treatment is also based on whether you want to have a baby or whether you need contraception.   Treatment may include:  · Progesterone hormone, to start a menstrual period.  · Birth control pills, to make you have regular menstrual periods.  · Medicines to make you ovulate, if you want to get pregnant.  · Medicines to control your insulin.  · Medicine to control your blood pressure.  · Medicine and diet, to control your high cholesterol and triglycerides in your blood.  · Surgery, making small holes in the ovary, to decrease the amount of female hormone production. This is done through a long, lighted tube (laparoscope), placed into the pelvis through a tiny incision in the lower abdomen.  Your caregiver will go over some of the choices with you.  WOMEN WITH PCOS HAVE THESE CHARACTERISTICS:  · High levels of female hormones called androgens.  · An irregular or no menstrual cycle.  · May have many small cysts in their ovaries.  PCOS is the most common hormonal reproductive problem in women of childbearing age.  WHY DO WOMEN WITH PCOS HAVE TROUBLE WITH THEIR MENSTRUAL CYCLE?  Each month, about 20 eggs start to mature in the ovaries. As one egg grows and matures, the follicle breaks open to release the egg, so it can travel through the fallopian tube for  fertilization. When the single egg leaves the follicle, ovulation takes place. In women with PCOS, the ovary does not make all of the hormones it needs for any of the eggs to fully mature. They may start to grow and accumulate fluid, but no one egg becomes large enough. Instead, some may remain as cysts. Since no egg matures or is released, ovulation does not occur and the hormone progesterone is not made. Without progesterone, a woman's menstrual cycle is irregular or absent. Also, the cysts produce female hormones, which continue to prevent ovulation.   Document Released: 12/19/2004 Document Revised: 11/17/2011 Document Reviewed: 07/13/2009  ExitCare® Patient Information ©2014 [employer].

## 2013-03-04 NOTE — Assessment & Plan Note (Signed)
She has recurrent episodes of peri-menstrual pelvic pain, this year CT and U/S were normal She gets some relief with norco but she would like to try something to prevent the pain from occurring I have advised her to start metformin

## 2013-03-04 NOTE — Assessment & Plan Note (Signed)
Start metformin.

## 2013-03-08 ENCOUNTER — Ambulatory Visit
Admission: RE | Admit: 2013-03-08 | Discharge: 2013-03-08 | Disposition: A | Discharge status: Home / Self Care | Payer: 59 | Source: Ambulatory Visit | Attending: Internal Medicine | Admitting: Internal Medicine

## 2013-03-08 DIAGNOSIS — E282 Polycystic ovarian syndrome: Secondary | ICD-10-CM

## 2013-03-08 DIAGNOSIS — M25559 Pain in unspecified hip: Secondary | ICD-10-CM

## 2013-03-08 DIAGNOSIS — R1032 Left lower quadrant pain: Secondary | ICD-10-CM

## 2013-03-09 ENCOUNTER — Telehealth: Payer: Self-pay | Admitting: *Deleted

## 2013-03-09 ENCOUNTER — Encounter: Payer: Self-pay | Admitting: Internal Medicine

## 2013-03-09 NOTE — Telephone Encounter (Signed)
Pt called requesting [place] results.  Pt notified that results have been mailed to her.

## 2013-04-27 ENCOUNTER — Other Ambulatory Visit: Payer: Self-pay | Admitting: *Deleted

## 2013-06-13 ENCOUNTER — Other Ambulatory Visit: Payer: Self-pay | Admitting: Internal Medicine

## 2013-07-13 ENCOUNTER — Ambulatory Visit (INDEPENDENT_AMBULATORY_CARE_PROVIDER_SITE_OTHER): Payer: 59 | Admitting: Internal Medicine

## 2013-07-13 ENCOUNTER — Encounter: Payer: Self-pay | Admitting: Internal Medicine

## 2013-07-13 ENCOUNTER — Other Ambulatory Visit (INDEPENDENT_AMBULATORY_CARE_PROVIDER_SITE_OTHER): Payer: 59

## 2013-07-13 VITALS — BP 110/80 | HR 94 | Temp 98.2°F | Resp 16 | Ht 69.0 in

## 2013-07-13 DIAGNOSIS — D529 Folate deficiency anemia, unspecified: Secondary | ICD-10-CM

## 2013-07-13 DIAGNOSIS — D519 Vitamin B12 deficiency anemia, unspecified: Secondary | ICD-10-CM

## 2013-07-13 DIAGNOSIS — Z9884 Bariatric surgery status: Secondary | ICD-10-CM

## 2013-07-13 DIAGNOSIS — D509 Iron deficiency anemia, unspecified: Secondary | ICD-10-CM

## 2013-07-13 DIAGNOSIS — E559 Vitamin D deficiency, unspecified: Secondary | ICD-10-CM

## 2013-07-13 DIAGNOSIS — D518 Other vitamin B12 deficiency anemias: Secondary | ICD-10-CM

## 2013-07-13 LAB — COMPREHENSIVE METABOLIC PANEL
ALT: 21 U/L (ref 0–35)
AST: 20 U/L (ref 0–37)
Albumin: 3.9 g/dL (ref 3.5–5.2)
CO2: 28 mEq/L (ref 19–32)
Calcium: 9.1 mg/dL (ref 8.4–10.5)
Chloride: 103 mEq/L (ref 96–112)
GFR: 124.5 mL/min (ref >60.00)
Sodium: 135 mEq/L (ref 135–145)
Total Protein: 7.5 g/dL (ref 6.0–8.3)

## 2013-07-13 LAB — FOLATE: Folate: 7.2 ng/mL (ref >5.9)

## 2013-07-13 LAB — CBC WITH DIFFERENTIAL/PLATELET
Basophils Absolute: 0 10*3/uL (ref 0.0–0.1)
Eosinophils Absolute: 0.1 10*3/uL (ref 0.0–0.7)
HCT: 30.2 % — ABNORMAL LOW (ref 36.0–46.0)
Lymphocytes Relative: 36.1 % (ref 12.0–46.0)
Lymphs Abs: 2.1 10*3/uL (ref 0.7–4.0)
MCHC: 32 g/dL (ref 30.0–36.0)
Monocytes Relative: 6 % (ref 3.0–12.0)
Platelets: 234 10*3/uL (ref 150.0–400.0)
RDW: 20.3 % — ABNORMAL HIGH (ref 11.5–14.6)

## 2013-07-13 LAB — IBC PANEL
Saturation Ratios: 4.9 % — ABNORMAL LOW (ref 20.0–50.0)
Transferrin: 306.3 mg/dL (ref 212.0–360.0)

## 2013-07-13 MED ORDER — CYANOCOBALAMIN 1000 MCG/ML IJ SOLN
1000.0000 ug | Freq: Once | INTRAMUSCULAR | Status: AC
  Administered 2013-07-13: 1000 ug via INTRAMUSCULAR

## 2013-07-13 NOTE — Assessment & Plan Note (Signed)
I will recheck her CBC and her iron level 

## 2013-07-13 NOTE — Assessment & Plan Note (Signed)
She agrees to restart Vit B12 injections every month

## 2013-07-13 NOTE — Assessment & Plan Note (Signed)
I will recheck her labs to see if he has developed any deficiencies

## 2013-07-13 NOTE — Progress Notes (Signed)
  Subjective:    Patient ID: Anna Nguyen, female    DOB: 12-Jun-1986, 27 y.o.   MRN: [id number]  Anemia Presents for follow-up visit. There has been no abdominal pain, anorexia, bruising/bleeding easily, confusion, fever, leg swelling, light-headedness, malaise/fatigue, pallor, palpitations, paresthesias, pica or weight loss. Signs of blood loss that are not present include hematemesis, hematochezia, melena, menorrhagia and vaginal bleeding. Past treatments include folic acid and parenteral vitamin B12. Past medical history includes malabsorption.      Review of Systems  Constitutional: Negative.  Negative for fever, chills, weight loss, malaise/fatigue, diaphoresis, appetite change and fatigue.  HENT: Negative.   Eyes: Negative.   Respiratory: Negative.  Negative for cough, chest tightness, shortness of breath, wheezing and stridor.   Cardiovascular: Negative.  Negative for chest pain, palpitations and leg swelling.  Gastrointestinal: Negative.  Negative for nausea, vomiting, abdominal pain, diarrhea, constipation, blood in stool, melena, hematochezia, anorexia and hematemesis.  Endocrine: Negative.   Genitourinary: Negative.  Negative for vaginal bleeding and menorrhagia.  Musculoskeletal: Negative.   Skin: Negative.  Negative for pallor.  Allergic/Immunologic: Negative.   Neurological: Negative for dizziness, tremors, weakness, light-headedness, numbness, headaches and paresthesias.  Hematological: Negative.  Negative for adenopathy. Does not bruise/bleed easily.  Psychiatric/Behavioral: Negative.  Negative for confusion.       Objective:   Physical Exam  Vitals reviewed. Constitutional: She is oriented to person, place, and time. She appears well-developed and well-nourished. No distress.  HENT:  Head: Normocephalic and atraumatic.  Mouth/Throat: Oropharynx is clear and moist. No oropharyngeal exudate.  Eyes: Conjunctivae are normal. Right eye exhibits no discharge. Left eye  exhibits no discharge. No scleral icterus.  Neck: Normal range of motion. Neck supple. No JVD present. No tracheal deviation present. No thyromegaly present.  Cardiovascular: Normal rate, regular rhythm, normal heart sounds and intact distal pulses.  Exam reveals no gallop and no friction rub.   No murmur heard. Pulmonary/Chest: Effort normal and breath sounds normal. No stridor. No respiratory distress. She has no wheezes. She has no rales. She exhibits no tenderness.  Abdominal: Soft. Bowel sounds are normal. She exhibits no distension and no mass. There is no tenderness. There is no rebound and no guarding.  Musculoskeletal: Normal range of motion. She exhibits no edema and no tenderness.  Lymphadenopathy:    She has no cervical adenopathy.  Neurological: She is oriented to person, place, and time.  Skin: Skin is warm and dry. No rash noted. She is not diaphoretic. No erythema. No pallor.  Psychiatric: She has a normal mood and affect. Her behavior is normal. Judgment and thought content normal.      Lab Results  Component Value Date   WBC 6.6 11/16/2012   HGB 11.1* 11/16/2012   HCT 33.5* 11/16/2012   PLT 236.0 11/16/2012   GLUCOSE 93 11/16/2012   CHOL 149 07/13/2012   TRIG 74.0 07/13/2012   HDL 75.90 07/13/2012   LDLCALC 58 07/13/2012   ALT 20 07/13/2012   AST 18 07/13/2012   NA 137 11/16/2012   K 4.0 11/16/2012   CL 104 11/16/2012   CREATININE 0.7 11/16/2012   BUN 11 11/16/2012   CO2 27 11/16/2012   TSH 1.16 07/13/2012   HGBA1C 5.1 07/13/2012   MICROALBUR 1.13 10/03/2008      Assessment & Plan:

## 2013-07-13 NOTE — Progress Notes (Signed)
Pre-visit discussion using our clinic review tool. No additional management support is needed unless otherwise documented below in the visit note.  

## 2013-07-13 NOTE — Patient Instructions (Signed)

## 2013-07-13 NOTE — Assessment & Plan Note (Signed)
I will check her CBC and her folate level

## 2013-07-14 LAB — VITAMIN D 25 HYDROXY (VIT D DEFICIENCY, FRACTURES): Vit D, 25-Hydroxy: <10 ng/mL — ABNORMAL LOW (ref 30–89)

## 2013-07-15 ENCOUNTER — Encounter: Payer: Self-pay | Admitting: Internal Medicine

## 2013-07-15 DIAGNOSIS — E559 Vitamin D deficiency, unspecified: Secondary | ICD-10-CM | POA: Insufficient documentation

## 2013-07-15 MED ORDER — FERRALET 90 90-1 MG PO TABS: 1.0000 | ORAL_TABLET | Freq: Every day | ORAL | Status: DC

## 2013-07-15 MED ORDER — CHOLECALCIFEROL 1.25 MG (50000 UT) PO TABS: 1.0000 | ORAL_TABLET | ORAL | Status: DC

## 2013-07-15 NOTE — Addendum Note (Signed)
Addended by: Etta Grandchild on: 07/15/2013 12:55 PM   Modules accepted: Orders

## 2013-07-16 LAB — VITAMIN B1: Vitamin B1 (Thiamine): 11 nmol/L (ref 8–30)

## 2013-07-27 ENCOUNTER — Other Ambulatory Visit: Payer: Self-pay | Admitting: Internal Medicine

## 2013-08-08 ENCOUNTER — Ambulatory Visit: Payer: 59 | Admitting: Endocrinology

## 2013-08-08 DIAGNOSIS — Z0289 Encounter for other administrative examinations: Secondary | ICD-10-CM

## 2013-08-16 ENCOUNTER — Ambulatory Visit: Payer: 59

## 2013-08-17 ENCOUNTER — Telehealth: Payer: Self-pay

## 2013-08-17 ENCOUNTER — Ambulatory Visit (INDEPENDENT_AMBULATORY_CARE_PROVIDER_SITE_OTHER): Payer: 59

## 2013-08-17 DIAGNOSIS — D519 Vitamin B12 deficiency anemia, unspecified: Secondary | ICD-10-CM

## 2013-08-17 DIAGNOSIS — D518 Other vitamin B12 deficiency anemias: Secondary | ICD-10-CM

## 2013-08-17 MED ORDER — CYANOCOBALAMIN 1000 MCG/ML IJ SOLN
1000.0000 ug | Freq: Once | INTRAMUSCULAR | Status: AC
  Administered 2013-08-17: 1000 ug via INTRAMUSCULAR

## 2013-08-17 NOTE — Telephone Encounter (Signed)
Pt stopped by office for nurse visit and requested refeill for her hydrocodone. I advise that PCP is out of the office until 08/22/13 and will forward to one of his partners.

## 2013-08-18 MED ORDER — HYDROCODONE-ACETAMINOPHEN 5-325 MG PO TABS: ORAL_TABLET | ORAL | Status: DC

## 2013-08-18 NOTE — Telephone Encounter (Signed)
Ok for refill? 

## 2013-08-18 NOTE — Telephone Encounter (Signed)
Pt to pick up rx

## 2013-08-22 ENCOUNTER — Encounter: Payer: Self-pay | Admitting: Internal Medicine

## 2013-08-22 ENCOUNTER — Other Ambulatory Visit: Payer: Self-pay | Admitting: Internal Medicine

## 2013-08-22 ENCOUNTER — Ambulatory Visit (INDEPENDENT_AMBULATORY_CARE_PROVIDER_SITE_OTHER): Payer: 59 | Admitting: Internal Medicine

## 2013-08-22 ENCOUNTER — Encounter: Payer: Self-pay | Admitting: *Deleted

## 2013-08-22 VITALS — BP 142/96 | HR 110 | Temp 98.9°F | Resp 10 | Ht 69.0 in | Wt 206.0 lb

## 2013-08-22 DIAGNOSIS — E162 Hypoglycemia, unspecified: Secondary | ICD-10-CM

## 2013-08-22 DIAGNOSIS — R739 Hyperglycemia, unspecified: Secondary | ICD-10-CM | POA: Insufficient documentation

## 2013-08-22 DIAGNOSIS — E161 Other hypoglycemia: Secondary | ICD-10-CM

## 2013-08-22 MED ORDER — GLUCAGON (RDNA) 1 MG IJ KIT: 1.0000 mg | PACK | Freq: Once | INTRAMUSCULAR | Status: DC | PRN

## 2013-08-22 MED ORDER — ONETOUCH LANCETS MISC: Status: DC

## 2013-08-22 MED ORDER — GLUCOSE BLOOD VI STRP: ORAL_STRIP | Status: DC

## 2013-08-22 NOTE — Progress Notes (Addendum)
Patient ID: Anna Nguyen, female   DOB: 1986-03-25, 27 y.o.   MRN: [id number]  HPI: Anna Nguyen is a 27 y.o.-year-old female, referred by her PCP, Dr. Shea Evans [facility]), for management of DM2, non-insulin-dependent, without complications.  Patient has been diagnosed with diabetes in 2006 at 27 y/o - unclear if type 1 or 2. Last hemoglobin A1c available to me was:  Lab Results  Component Value Date   HGBA1C 5.1 07/13/2012   HGBA1C 5.5 05/06/2011   HGBA1C 6.5 02/19/2011   She had GBP (RenY) in 11/2010 >> lost 125 lbs >> DM resolved >> started to have low blood sugars >> advised to reintroduce complex carbs in her diet to keep sugars up >> gained 30 lbs >> CBGs started to increased to 200s. She does not bring a log today.  She is not on medicines for diabetes. She is not checking sugars as she was labeled as a nondiabetic, and insurance does not cover strips.  Pt checks her sugars 1-5 a day (only when not feeling good as she does not have enough strips to check consistently) and they are mostly normal (89-90), but also 130-140 or 60s. She has + lows . Lowest sugar was LO, then 39 (1.5 hours before had a breakfast at [facility]); she has hypoglycemia awareness at 65. Lowest upon waking up (around 4 pm, as she works nights): 65. Boyfriend sometimes wakes her up from sleeps when she sweats >> this is usually 1-2 h after going to sleep in the morning, after her am meal. Highest sugar was 200s.  - no CKD, last BUN/creatinine:  Lab Results  Component Value Date   BUN 13 07/13/2013   CREATININE 0.7 07/13/2013   - last set of lipids: Lab Results  Component Value Date   CHOL 149 07/13/2012   HDL 75.90 07/13/2012   LDLCALC 58 07/13/2012   TRIG 74.0 07/13/2012   CHOLHDL 2 07/13/2012   - last eye exam was in 2012. No DR.  - + numbness and tingling in her feet >> improved, but reappeared 01/2013.  She also has PCOS. Pt has FH of DM in [place].  ROS: Constitutional: no weight  gain/loss, no fatigue, no subjective hyperthermia/hypothermia Eyes: no blurry vision, no xerophthalmia ENT: no sore throat, no nodules palpated in throat, no dysphagia/odynophagia, no hoarseness Cardiovascular: no CP/SOB/palpitations/leg swelling Respiratory: no cough/SOB Gastrointestinal: no N/V/D/C Musculoskeletal: no muscle/joint aches Skin: no rashes Neurological: no tremors/numbness/tingling/dizziness Psychiatric: no depression/anxiety  Past Medical History  Diagnosis Date  . Anemia, iron deficiency   . Type II or unspecified type diabetes mellitus without mention of complication, not stated as uncontrolled   . HTN (hypertension)   . PCOS (polycystic ovarian syndrome)   . Pregnant state, incidental    Past Surgical History  Procedure Laterality Date  . Tonsillectomy    . Laparoscopic gastric banding     History   Social History  . Marital Status: Single    Spouse Name: N/A    Number of Children: N/A   Occupational History  . SALES     [employer]   Social History Main Topics  . Smoking status: Never Smoker   . Smokeless tobacco: Never Used  . Alcohol Use: 0.0 oz/week  . Drug Use: No  . Sexual Activity: Yes    Partners: Male    Birth Control/ Protection: IUD   Social History Narrative   Regular Exercise-yes   Caffeine use: occasionaly   Current Outpatient Prescriptions on File Prior to  Visit  Medication Sig Dispense Refill  . Cholecalciferol 50000 UNITS TABS Take 1 tablet by mouth once a week.  12 tablet  3  . cyanocobalamin (,VITAMIN B-12,) 1000 MCG/ML injection Inject 1 mL (1,000 mcg total) into the muscle every 30 (thirty) days.  10 mL  1  . Fe Cbn-Fe Gluc-FA-B12-C-DSS (FERRALET 90) 90-1 MG TABS Take 1 tablet by mouth daily.  90 each  3  . folic acid (FOLVITE) 1 MG tablet Take 1 tablet (1 mg total) by mouth daily.  90 tablet  3  . HYDROcodone-acetaminophen (NORCO/VICODIN) 5-325 MG per tablet TAKE 1 TABLET BY MOUTH EVERY 6 HOURS AS NEEDED FOR PAIN  30 tablet  0  .  NUVARING 0.12-0.015 MG/24HR vaginal ring INSERT 1 RING VAGINALLY AND LEAVE IN PLACE FOR 3 WEEKS, THEN REMOVE FOR 1 WEEK  3 each  0  . SYRINGE-NEEDLE, DISP, 3 ML (BD ECLIPSE SYRINGE) 25G X 5/8" 3 ML MISC Use once monthly with B12 injection  12 each  0   No current facility-administered medications on file prior to visit.   Allergies  Allergen Reactions  . Aspirin     Per surgeon  . Latex   . Nsaids     Per Careers adviser.   Family History  Problem Relation Age of Onset  . Arthritis Other   . Diabetes Other   . Hypertension Other   . Hyperlipidemia Other   . Prostate cancer Other   . Cancer Maternal Aunt     breast   PE: BP 142/96  Pulse 110  Temp(Src) 98.9 F (37.2 C) (Oral)  Resp 10  Ht 5\' 9"  (1.753 m)  Wt 206 lb (93.441 kg)  BMI 30.41 kg/m2  SpO2 97% Wt Readings from Last 3 Encounters:  08/22/13 206 lb (93.441 kg)  02/28/13 193 lb 12 oz (87.884 kg)  11/16/12 181 lb (82.101 kg)   Constitutional: overweight, in NAD Eyes: PERRLA, EOMI, no exophthalmos ENT: moist mucous membranes, no thyromegaly, no cervical lymphadenopathy Cardiovascular: RRR, No MRG Respiratory: CTA B Gastrointestinal: abdomen soft, NT, ND, BS+ Musculoskeletal: no deformities, strength intact in all 4 Skin: moist, warm, no rashes Neurological: no tremor with outstretched hands, DTR normal in all 4  ASSESSMENT: 1. DM2, non-insulin-dependent, without complications  2. Reactive hypoglycemia  - after GBP sx: R-en-Y in 2012  PLAN:  1. Patient with h/o DM (unclear if 1 or 2, but likely 2), s/p Roux-en-Y gastric bypass in 2012, with subsequent improved/resolved diabetes and significant weight loss, of 125 lbs; now with reactive hypoglycemia approximately 1-1/2 hours after meals. This is not unusual after gastric bypass, and is usually caused by straying away from the recommended diet. I think the most important intervention is to set patient up with nutrition, which she agrees with.  - I suggested to:   Patient Instructions  Please check your sugars frequently at home: fasting and 1.5 hours after you start eating. If sugars <55, please go to the nearest lab or emergency room to have labs (printed or you) - have somebody drive you. We will schedule you to see nutrition for help with what foods to eat to improve your sugars - please wait for their call.  Also, see below: "15-15 rule" for hypoglycemia: if sugars are low, take 15 g of carbs** ("fast sugar" - e.g. 4 glucose tablets, 4 oz orange juice), wait 15 min, then check sugars again. If still <80, repeat. Continue until your sugars >80, then eat a normal meal.   Teach family  members and coworkers to inject glucagon. Have a glucagon set at home and one at work. They should call 911 after using the set.  - Strongly advised her to start checking sugars at different times of the day - check 5 times a day, rotating checks - I also gave her a prescription for several labs, to obtain if her sugars are low - we might need to mixed meal test in the near future - given sugar log and advised how to fill it and to bring it at next appt  - given instructions for hypoglycemia management "15-15 rule"  - send prescriptions for 2 glucagon kits to her pharmacy - advised how to use it - refilled strips - advised for yearly eye exams - Return to clinic in 1 mo with sugar log   Felt hypoglycemic in the office >> CBG 119.  Orders Placed This Encounter  Procedures  . C-peptide  . Insulin, random  . Beta-hydroxybutyric acid  . Miscellaneous test (proinsulin level)  . Glucose, Random  . Amb ref to Medical Nutrition Therapy-MNT   08/23/2013 Received records from patient's OB/GYN doctor: - Patient with history of PCOS, with menstrual abnormalities since approximately 02/2013. They have been normal previously after losing weight post GBP. She has a history of an elective miscarriage in 08/18/2011 due to hyperemesis. She has been tried on metformin for PCOS,  however she could not tolerate it. She also could not tolerate oral contraceptives and did not like Mirena in the past. She is now on prn Provera to induce menstrual cycles. Method of contraception: withdrawal. She has had a pelvic ultrasound on 03/08/2013 that showed ovarian cysts. A Pap smear was normal on 07/12/2013. - pt also complained of fluctuations in her CBGs, especially after trying metformin for PCOS. Insulin level on 05/06/2013 was 32.4 (2.6-24.9). There was no associated glucose level with this, and also he was not mention the time of the day when this was collected for the relationship with a meal. In this context, it is difficult to label this insulin level if abnormal. No record of HbA1c.

## 2013-08-22 NOTE — Patient Instructions (Addendum)
Please check your sugars frequently at home: fasting and 1.5 hours after you start eating. If sugars <55, please go to the nearest lab or emergency room to have labs (printed or you) - have somebody drive you. We will schedule you to see nutrition for help with what foods to eat to improve your sugars - please wait for their call. Please return in 1 month with your sugar log.  Also, see below: "15-15 rule" for hypoglycemia: if sugars are low, take 15 g of carbs** ("fast sugar" - e.g. 4 glucose tablets, 4 oz orange juice), wait 15 min, then check sugars again. If still <80, repeat. Continue  until your sugars >80, then eat a normal meal.   Teach family members and coworkers to inject glucagon. Have a glucagon set at home and one at work. They should call 911 after using the set.   **E.g. of "fast carbs":   first choice (15 g):  1 tube glucose gel, GlucoPouch 15, 2 oz glucose liquid   second choice (15-16 g):  3 or 4 glucose tablets (best taken  with water), 15 Dextrose Bits chewable   third choice (15-20 g):   cup fruit juice,  cup regular soda, 1 cup skim milk,  1 cup sports drink   fourth choice (15-20 g):  1 small tube [place] gel (not frosting), 2 tbsp raisins, 1 tbsp table sugar,  candy, jelly beans, gum drops - check package for carb amount   (adapted from: Juluis Rainier. "Insulin therapy and hypoglycemia" Endocrinol Metab Clin N Am 2012, 41: 57-87)

## 2013-08-23 MED ORDER — ETONOGESTREL-ETHINYL ESTRADIOL 0.12-0.015 MG/24HR VA RING: VAGINAL_RING | VAGINAL | Status: DC

## 2013-09-09 ENCOUNTER — Telehealth: Payer: Self-pay | Admitting: *Deleted

## 2013-09-09 NOTE — Telephone Encounter (Signed)
Spoke with pt advised Rx has been discontinued.  Transferred to scheduling for appointment.

## 2013-09-09 NOTE — Telephone Encounter (Signed)
This has been discontinued 

## 2013-09-09 NOTE — Telephone Encounter (Signed)
Pt called requesting Hydrocodone refill.  Please advise 

## 2013-09-15 ENCOUNTER — Ambulatory Visit: Payer: 59 | Admitting: Internal Medicine

## 2013-09-15 DIAGNOSIS — Z0289 Encounter for other administrative examinations: Secondary | ICD-10-CM

## 2013-09-21 ENCOUNTER — Ambulatory Visit (INDEPENDENT_AMBULATORY_CARE_PROVIDER_SITE_OTHER): Payer: 59 | Admitting: Internal Medicine

## 2013-09-21 ENCOUNTER — Encounter: Payer: Self-pay | Admitting: Internal Medicine

## 2013-09-21 ENCOUNTER — Other Ambulatory Visit (INDEPENDENT_AMBULATORY_CARE_PROVIDER_SITE_OTHER): Payer: 59

## 2013-09-21 VITALS — BP 130/80 | HR 93 | Temp 98.0°F | Resp 16 | Ht 69.0 in

## 2013-09-21 DIAGNOSIS — D529 Folate deficiency anemia, unspecified: Secondary | ICD-10-CM

## 2013-09-21 DIAGNOSIS — D518 Other vitamin B12 deficiency anemias: Secondary | ICD-10-CM

## 2013-09-21 DIAGNOSIS — D509 Iron deficiency anemia, unspecified: Secondary | ICD-10-CM

## 2013-09-21 DIAGNOSIS — E559 Vitamin D deficiency, unspecified: Secondary | ICD-10-CM

## 2013-09-21 DIAGNOSIS — E282 Polycystic ovarian syndrome: Secondary | ICD-10-CM

## 2013-09-21 DIAGNOSIS — M25559 Pain in unspecified hip: Secondary | ICD-10-CM

## 2013-09-21 DIAGNOSIS — D519 Vitamin B12 deficiency anemia, unspecified: Secondary | ICD-10-CM

## 2013-09-21 LAB — CBC WITH DIFFERENTIAL/PLATELET
Basophils Absolute: 0 10*3/uL (ref 0.0–0.1)
Basophils Relative: 0.4 % (ref 0.0–3.0)
EOS ABS: 0.1 10*3/uL (ref 0.0–0.7)
Eosinophils Relative: 1.6 % (ref 0.0–5.0)
HCT: 29.4 % — ABNORMAL LOW (ref 36.0–46.0)
Hemoglobin: 9.5 g/dL — ABNORMAL LOW (ref 12.0–15.0)
Lymphocytes Relative: 43.5 % (ref 12.0–46.0)
Lymphs Abs: 2.9 10*3/uL (ref 0.7–4.0)
MCHC: 32.2 g/dL (ref 30.0–36.0)
MCV: 77.2 fl — ABNORMAL LOW (ref 78.0–100.0)
Monocytes Absolute: 0.4 10*3/uL (ref 0.1–1.0)
Monocytes Relative: 6.6 % (ref 3.0–12.0)
NEUTROS PCT: 47.9 % (ref 43.0–77.0)
Neutro Abs: 3.1 10*3/uL (ref 1.4–7.7)
Platelets: 283 10*3/uL (ref 150.0–400.0)
RBC: 3.81 Mil/uL — ABNORMAL LOW (ref 3.87–5.11)
RDW: 21.5 % — ABNORMAL HIGH (ref 11.5–14.6)
WBC: 6.6 10*3/uL (ref 4.5–10.5)

## 2013-09-21 LAB — FOLATE: Folate: 8.8 ng/mL (ref >5.9)

## 2013-09-21 LAB — FERRITIN: Ferritin: 9.8 ng/mL — ABNORMAL LOW (ref 10.0–291.0)

## 2013-09-21 LAB — IBC PANEL
IRON: 84 ug/dL (ref 42–145)
SATURATION RATIOS: 18.1 % — AB (ref 20.0–50.0)
Transferrin: 332.1 mg/dL (ref 212.0–360.0)

## 2013-09-21 LAB — VITAMIN B12: VITAMIN B 12: 151 pg/mL — AB (ref 211–911)

## 2013-09-21 MED ORDER — HYDROCODONE-ACETAMINOPHEN 5-325 MG PO TABS: ORAL_TABLET | ORAL | Status: DC

## 2013-09-21 NOTE — Progress Notes (Signed)
Subjective:    Patient ID: Anna Nguyen, female    DOB: 09/25/1985, 28 y.o.   MRN: [id number]  Anemia Presents for follow-up visit. Symptoms include abdominal pain and malaise/fatigue. There has been no anorexia, bruising/bleeding easily, confusion, fever, leg swelling, light-headedness, pallor, palpitations, paresthesias, pica or weight loss. (She has intermittent pelvic pain that is related to her menstrual cycle, this occurs for one week before and one week after her cycle occurs. She thinks it is related to the PCOS but she sees her GYN soon to see if there is something else like endometriosis.) Signs of blood loss that are not present include hematemesis, hematochezia, melena, menorrhagia and vaginal bleeding. Past treatments include folic acid, oral iron supplements and parenteral vitamin B12. There are no compliance problems.       Review of Systems  Constitutional: Positive for malaise/fatigue. Negative for fever, chills, weight loss, diaphoresis, appetite change and fatigue.  HENT: Negative.   Eyes: Negative.   Respiratory: Negative.  Negative for cough, choking, chest tightness, shortness of breath, wheezing and stridor.   Cardiovascular: Negative.  Negative for chest pain, palpitations and leg swelling.  Gastrointestinal: Positive for abdominal pain. Negative for nausea, vomiting, diarrhea, constipation, blood in stool, melena, hematochezia, abdominal distention, anal bleeding, rectal pain, anorexia and hematemesis.  Endocrine: Negative.   Genitourinary: Negative.  Negative for vaginal bleeding and menorrhagia.  Musculoskeletal: Negative.   Skin: Negative.  Negative for pallor.  Allergic/Immunologic: Negative.   Neurological: Negative.  Negative for dizziness, tremors, weakness, light-headedness, numbness, headaches and paresthesias.  Hematological: Negative.  Negative for adenopathy. Does not bruise/bleed easily.  Psychiatric/Behavioral: Negative.  Negative for confusion.         Objective:   Physical Exam  Vitals reviewed. Constitutional: She is oriented to person, place, and time. She appears well-developed and well-nourished. No distress.  HENT:  Head: Normocephalic and atraumatic.  Mouth/Throat: Oropharynx is clear and moist. No oropharyngeal exudate.  Eyes: Conjunctivae are normal. Right eye exhibits no discharge. Left eye exhibits no discharge. No scleral icterus.  Neck: Normal range of motion. Neck supple. No JVD present. No tracheal deviation present. No thyromegaly present.  Cardiovascular: Normal rate, regular rhythm, normal heart sounds and intact distal pulses.  Exam reveals no gallop and no friction rub.   No murmur heard. Pulmonary/Chest: Effort normal and breath sounds normal. No stridor. No respiratory distress. She has no wheezes. She has no rales. She exhibits no tenderness.  Abdominal: Soft. Bowel sounds are normal. She exhibits no distension and no mass. There is no tenderness. There is no rebound and no guarding.  Musculoskeletal: Normal range of motion. She exhibits no edema and no tenderness.  Lymphadenopathy:    She has no cervical adenopathy.  Neurological: She is oriented to person, place, and time.  Skin: Skin is warm and dry. No rash noted. She is not diaphoretic. No erythema. No pallor.  Psychiatric: She has a normal mood and affect. Her behavior is normal. Judgment and thought content normal.     Lab Results  Component Value Date   WBC 5.7 07/13/2013   HGB 9.7* 07/13/2013   HCT 30.2* 07/13/2013   PLT 234.0 07/13/2013   GLUCOSE 106* 07/13/2013   CHOL 149 07/13/2012   TRIG 74.0 07/13/2012   HDL 75.90 07/13/2012   LDLCALC 58 07/13/2012   ALT 21 07/13/2013   AST 20 07/13/2013   NA 135 07/13/2013   K 3.9 07/13/2013   CL 103 07/13/2013   CREATININE 0.7 07/13/2013  BUN 13 07/13/2013   CO2 28 07/13/2013   TSH 1.16 07/13/2012   HGBA1C 5.1 07/13/2012   MICROALBUR 1.13 10/03/2008       Assessment & Plan:

## 2013-09-21 NOTE — Progress Notes (Signed)
Pre-visit discussion using our clinic review tool. No additional management support is needed unless otherwise documented below in the visit note.  

## 2013-09-21 NOTE — Assessment & Plan Note (Signed)
She will cont norco as needed for pain 

## 2013-09-21 NOTE — Assessment & Plan Note (Signed)
I will recheck her Vt D level today

## 2013-09-21 NOTE — Assessment & Plan Note (Signed)
I will check her CBC and folic acid level today

## 2013-09-21 NOTE — Patient Instructions (Signed)

## 2013-09-21 NOTE — Assessment & Plan Note (Signed)
I will recheck her CBC and her iron level today 

## 2013-09-22 ENCOUNTER — Encounter: Payer: Self-pay | Admitting: Internal Medicine

## 2013-09-22 ENCOUNTER — Ambulatory Visit: Payer: 59 | Admitting: Internal Medicine

## 2013-09-22 DIAGNOSIS — Z0289 Encounter for other administrative examinations: Secondary | ICD-10-CM

## 2013-09-22 LAB — VITAMIN D 25 HYDROXY (VIT D DEFICIENCY, FRACTURES): Vit D, 25-Hydroxy: <10 ng/mL — ABNORMAL LOW (ref 30–89)

## 2013-10-04 ENCOUNTER — Ambulatory Visit (INDEPENDENT_AMBULATORY_CARE_PROVIDER_SITE_OTHER): Payer: 59 | Admitting: *Deleted

## 2013-10-04 DIAGNOSIS — D519 Vitamin B12 deficiency anemia, unspecified: Secondary | ICD-10-CM

## 2013-10-04 DIAGNOSIS — D518 Other vitamin B12 deficiency anemias: Secondary | ICD-10-CM

## 2013-10-04 MED ORDER — CYANOCOBALAMIN 1000 MCG/ML IJ SOLN
1000.0000 ug | Freq: Once | INTRAMUSCULAR | Status: AC
  Administered 2013-10-04: 1000 ug via INTRAMUSCULAR

## 2013-10-11 ENCOUNTER — Ambulatory Visit: Payer: 59 | Admitting: *Deleted

## 2013-12-03 ENCOUNTER — Other Ambulatory Visit: Payer: Self-pay | Admitting: Internal Medicine

## 2013-12-05 ENCOUNTER — Other Ambulatory Visit: Payer: Self-pay | Admitting: Internal Medicine

## 2013-12-05 DIAGNOSIS — E282 Polycystic ovarian syndrome: Secondary | ICD-10-CM

## 2013-12-05 DIAGNOSIS — M25559 Pain in unspecified hip: Secondary | ICD-10-CM

## 2013-12-05 MED ORDER — HYDROCODONE-ACETAMINOPHEN 5-325 MG PO TABS: ORAL_TABLET | ORAL | Status: DC

## 2014-02-08 ENCOUNTER — Telehealth: Payer: Self-pay | Admitting: *Deleted

## 2014-02-08 NOTE — Telephone Encounter (Signed)
Spoke with pt advised of MDs message 

## 2014-02-08 NOTE — Telephone Encounter (Signed)
She is due for OV

## 2014-02-08 NOTE — Telephone Encounter (Signed)
Pt called requesting Hydrocodone refill.  Please advise 

## 2014-03-08 ENCOUNTER — Other Ambulatory Visit (INDEPENDENT_AMBULATORY_CARE_PROVIDER_SITE_OTHER): Payer: 59

## 2014-03-08 ENCOUNTER — Ambulatory Visit (INDEPENDENT_AMBULATORY_CARE_PROVIDER_SITE_OTHER): Payer: 59 | Admitting: Internal Medicine

## 2014-03-08 ENCOUNTER — Encounter: Payer: Self-pay | Admitting: Internal Medicine

## 2014-03-08 VITALS — BP 130/80 | HR 90 | Temp 98.5°F | Resp 16 | Ht 69.0 in | Wt 203.0 lb

## 2014-03-08 DIAGNOSIS — G4726 Circadian rhythm sleep disorder, shift work type: Secondary | ICD-10-CM

## 2014-03-08 DIAGNOSIS — Z9884 Bariatric surgery status: Secondary | ICD-10-CM

## 2014-03-08 DIAGNOSIS — D509 Iron deficiency anemia, unspecified: Secondary | ICD-10-CM

## 2014-03-08 DIAGNOSIS — D519 Vitamin B12 deficiency anemia, unspecified: Secondary | ICD-10-CM

## 2014-03-08 DIAGNOSIS — E559 Vitamin D deficiency, unspecified: Secondary | ICD-10-CM

## 2014-03-08 DIAGNOSIS — D518 Other vitamin B12 deficiency anemias: Secondary | ICD-10-CM

## 2014-03-08 DIAGNOSIS — D529 Folate deficiency anemia, unspecified: Secondary | ICD-10-CM

## 2014-03-08 DIAGNOSIS — M25559 Pain in unspecified hip: Secondary | ICD-10-CM

## 2014-03-08 DIAGNOSIS — E282 Polycystic ovarian syndrome: Secondary | ICD-10-CM

## 2014-03-08 LAB — IBC PANEL
Iron: 55 ug/dL (ref 42–145)
Saturation Ratios: 9.8 % — ABNORMAL LOW (ref 20.0–50.0)
TRANSFERRIN: 399.5 mg/dL — AB (ref 212.0–360.0)

## 2014-03-08 LAB — CBC WITH DIFFERENTIAL/PLATELET
BASOS ABS: 0 10*3/uL (ref 0.0–0.1)
Basophils Relative: 0.5 % (ref 0.0–3.0)
Eosinophils Absolute: 0 10*3/uL (ref 0.0–0.7)
Eosinophils Relative: 0.8 % (ref 0.0–5.0)
HCT: 32.5 % — ABNORMAL LOW (ref 36.0–46.0)
HEMOGLOBIN: 10.3 g/dL — AB (ref 12.0–15.0)
LYMPHS PCT: 36.7 % (ref 12.0–46.0)
Lymphs Abs: 2.2 10*3/uL (ref 0.7–4.0)
MCHC: 31.7 g/dL (ref 30.0–36.0)
MCV: 78.4 fl (ref 78.0–100.0)
MONOS PCT: 7 % (ref 3.0–12.0)
Monocytes Absolute: 0.4 10*3/uL (ref 0.1–1.0)
NEUTROS ABS: 3.3 10*3/uL (ref 1.4–7.7)
Neutrophils Relative %: 55 % (ref 43.0–77.0)
Platelets: 270 10*3/uL (ref 150.0–400.0)
RBC: 4.14 Mil/uL (ref 3.87–5.11)
RDW: 21.7 % — AB (ref 11.5–15.5)
WBC: 6 10*3/uL (ref 4.0–10.5)

## 2014-03-08 LAB — VITAMIN B12: Vitamin B-12: 88 pg/mL — ABNORMAL LOW (ref 211–911)

## 2014-03-08 LAB — FOLATE: Folate: 11.5 ng/mL (ref >5.9)

## 2014-03-08 LAB — FERRITIN: Ferritin: 4.5 ng/mL — ABNORMAL LOW (ref 10.0–291.0)

## 2014-03-08 LAB — VITAMIN D 25 HYDROXY (VIT D DEFICIENCY, FRACTURES): VITD: 23.97 ng/mL

## 2014-03-08 MED ORDER — SUVOREXANT 10 MG PO TABS: 1.0000 | ORAL_TABLET | Freq: Every evening | ORAL | Status: DC | PRN

## 2014-03-08 MED ORDER — CYANOCOBALAMIN-SALCAPROZATE 1000-100 MCG-MG PO TABS: 1.0000 | ORAL_TABLET | Freq: Every morning | ORAL | Status: DC

## 2014-03-08 MED ORDER — HYDROCODONE-ACETAMINOPHEN 5-325 MG PO TABS: ORAL_TABLET | ORAL | Status: DC

## 2014-03-08 NOTE — Progress Notes (Signed)
Subjective:    Patient ID: Anna Nguyen, female    DOB: 08/09/1986, 28 y.o.   MRN: [id number]  Anemia Presents for follow-up visit. There has been no abdominal pain, anorexia, bruising/bleeding easily, confusion, fever, leg swelling, light-headedness, malaise/fatigue, pallor, palpitations, paresthesias, pica or weight loss. Signs of blood loss that are present include menorrhagia. Signs of blood loss that are not present include hematemesis, hematochezia, melena and vaginal bleeding. Past treatments include folic acid, oral iron supplements and parenteral vitamin B12. Past medical history includes malnutrition. Compliance problems include psychosocial issues.       Review of Systems  Constitutional: Negative.  Negative for fever, chills, weight loss, malaise/fatigue, diaphoresis, appetite change and fatigue.  HENT: Negative.   Eyes: Negative.   Respiratory: Negative.  Negative for apnea, cough, choking, chest tightness, shortness of breath, wheezing and stridor.   Cardiovascular: Negative.  Negative for chest pain, palpitations and leg swelling.  Gastrointestinal: Negative.  Negative for nausea, vomiting, abdominal pain, constipation, blood in stool, melena, hematochezia, anorexia and hematemesis.  Endocrine: Negative.   Genitourinary: Positive for menstrual problem (she has bilateral pelvic pain around the time of her menses, norco controls the pain) and menorrhagia. Negative for dysuria, urgency, frequency, hematuria, flank pain, decreased urine volume, vaginal bleeding, vaginal discharge, enuresis, difficulty urinating, genital sores, vaginal pain, pelvic pain and dyspareunia.  Musculoskeletal: Negative for arthralgias, back pain, gait problem, joint swelling, myalgias, neck pain and neck stiffness.  Skin: Negative.  Negative for pallor.  Allergic/Immunologic: Negative.   Neurological: Negative.  Negative for dizziness, tremors, weakness, light-headedness, numbness, headaches and  paresthesias.  Hematological: Negative.  Negative for adenopathy. Does not bruise/bleed easily.  Psychiatric/Behavioral: Positive for sleep disturbance. Negative for suicidal ideas, hallucinations, behavioral problems, confusion, self-injury, dysphoric mood, decreased concentration and agitation. The patient is not nervous/anxious and is not hyperactive.        Objective:   Physical Exam  Vitals reviewed. Constitutional: She is oriented to person, place, and time. She appears well-developed and well-nourished. No distress.  HENT:  Head: Normocephalic and atraumatic.  Mouth/Throat: Oropharynx is clear and moist. No oropharyngeal exudate.  Eyes: Conjunctivae are normal. Right eye exhibits no discharge. Left eye exhibits no discharge. No scleral icterus.  Neck: Normal range of motion. Neck supple. No JVD present. No tracheal deviation present. No thyromegaly present.  Cardiovascular: Normal rate, regular rhythm, normal heart sounds and intact distal pulses.  Exam reveals no gallop and no friction rub.   No murmur heard. Pulmonary/Chest: Effort normal and breath sounds normal. No stridor. No respiratory distress. She has no wheezes. She has no rales. She exhibits no tenderness.  Abdominal: Soft. Bowel sounds are normal. She exhibits no distension and no mass. There is no tenderness. There is no rebound and no guarding.  Musculoskeletal: Normal range of motion. She exhibits no edema and no tenderness.  Lymphadenopathy:    She has no cervical adenopathy.  Neurological: She is oriented to person, place, and time.  Skin: Skin is warm and dry. No rash noted. She is not diaphoretic. No erythema. No pallor.  Psychiatric: She has a normal mood and affect. Her behavior is normal. Judgment and thought content normal.     Lab Results  Component Value Date   WBC 6.6 09/21/2013   HGB 9.5* 09/21/2013   HCT 29.4* 09/21/2013   PLT 283.0 09/21/2013   GLUCOSE 106* 07/13/2013   CHOL 149 07/13/2012   TRIG 74.0  07/13/2012   HDL 75.90 07/13/2012   LDLCALC 58 07/13/2012  ALT 21 07/13/2013   AST 20 07/13/2013   NA 135 07/13/2013   K 3.9 07/13/2013   CL 103 07/13/2013   CREATININE 0.7 07/13/2013   BUN 13 07/13/2013   CO2 28 07/13/2013   TSH 1.16 07/13/2012   HGBA1C 5.1 07/13/2012   MICROALBUR 1.13 10/03/2008       Assessment & Plan:

## 2014-03-08 NOTE — Progress Notes (Signed)
Pre visit review using our clinic review tool, if applicable. No additional management support is needed unless otherwise documented below in the visit note. 

## 2014-03-08 NOTE — Patient Instructions (Signed)

## 2014-03-09 ENCOUNTER — Encounter: Payer: Self-pay | Admitting: Internal Medicine

## 2014-03-09 MED ORDER — CHOLECALCIFEROL 1.25 MG (50000 UT) PO TABS: 1.0000 | ORAL_TABLET | ORAL | Status: DC

## 2014-03-09 NOTE — Assessment & Plan Note (Signed)
Her Vit D level remains low I have asked her to be more compliant with the Vit D supplements

## 2014-03-09 NOTE — Assessment & Plan Note (Signed)
She will try belsomra for this 

## 2014-03-09 NOTE — Assessment & Plan Note (Signed)
Will cont norco as needed for pain I have asked her to see GYN for f/up

## 2014-03-09 NOTE — Assessment & Plan Note (Signed)
H and H are better and her iron level has improved Cont ferralet

## 2014-03-09 NOTE — Assessment & Plan Note (Signed)
Her B12 level is low, she is not complaint with the injections I have asked to start oral B12 supplement

## 2014-03-09 NOTE — Assessment & Plan Note (Signed)
Her folate level has improved

## 2014-03-09 NOTE — Assessment & Plan Note (Signed)
I will recheck her Vit levels and will address as indicated

## 2014-03-09 NOTE — Assessment & Plan Note (Signed)
Cont norco as needed for pain F/up with GYN as directed

## 2014-03-12 LAB — VITAMIN B1: Vitamin B1 (Thiamine): 13 nmol/L (ref 8–30)

## 2014-03-13 ENCOUNTER — Encounter: Payer: Self-pay | Admitting: Internal Medicine

## 2014-03-13 LAB — VITAMIN B6: Vitamin B6: 12.6 ng/mL (ref 2.1–21.7)

## 2014-03-14 ENCOUNTER — Ambulatory Visit (INDEPENDENT_AMBULATORY_CARE_PROVIDER_SITE_OTHER): Payer: 59 | Admitting: *Deleted

## 2014-03-14 DIAGNOSIS — D519 Vitamin B12 deficiency anemia, unspecified: Secondary | ICD-10-CM

## 2014-03-14 DIAGNOSIS — D518 Other vitamin B12 deficiency anemias: Secondary | ICD-10-CM

## 2014-03-14 MED ORDER — CYANOCOBALAMIN 1000 MCG/ML IJ SOLN
1000.0000 ug | Freq: Once | INTRAMUSCULAR | Status: AC
  Administered 2014-03-14: 1000 ug via INTRAMUSCULAR

## 2014-03-16 ENCOUNTER — Telehealth: Payer: Self-pay | Admitting: Gynecology

## 2014-03-16 NOTE — Telephone Encounter (Signed)
LMTCB to schedule a new patient doctor referral. °

## 2014-03-17 ENCOUNTER — Telehealth: Payer: Self-pay | Admitting: Gynecology

## 2014-03-17 NOTE — Telephone Encounter (Signed)
Spoke with patient about scheduling a new patient doctor referral appointment. She will call back next week when she has her schedule in front of her.

## 2014-03-20 NOTE — Telephone Encounter (Signed)
lmtcb/[place] °

## 2014-03-24 NOTE — Telephone Encounter (Signed)
lmtcb/[place] °

## 2014-03-30 ENCOUNTER — Encounter: Payer: Self-pay | Admitting: Internal Medicine

## 2014-03-31 NOTE — Telephone Encounter (Signed)
Called patient and left a message I am routing the referral back to the referring doctors office. I have tried at least five times leaving messages each time to get the patient scheduled to no avail. The last message I left I let the patient know I am routing the referral back to the referring doctor's office and to please contact Dr. Sanda Lingerhomas Jones office when she is ready to schedule so the referral can be routed back to me at that time.

## 2014-04-11 NOTE — Telephone Encounter (Signed)
Referral routed back to referring office due to patient not returning calls to schedule. °

## 2014-04-14 ENCOUNTER — Ambulatory Visit: Payer: 59 | Admitting: Internal Medicine

## 2014-04-14 ENCOUNTER — Ambulatory Visit (INDEPENDENT_AMBULATORY_CARE_PROVIDER_SITE_OTHER): Payer: 59

## 2014-04-14 DIAGNOSIS — E538 Deficiency of other specified B group vitamins: Secondary | ICD-10-CM

## 2014-04-14 MED ORDER — CYANOCOBALAMIN 1000 MCG/ML IJ SOLN
1000.0000 ug | Freq: Once | INTRAMUSCULAR | Status: AC
  Administered 2014-04-14: 1000 ug via INTRAMUSCULAR

## 2014-06-27 ENCOUNTER — Other Ambulatory Visit: Payer: Self-pay | Admitting: Internal Medicine

## 2014-07-05 ENCOUNTER — Other Ambulatory Visit (INDEPENDENT_AMBULATORY_CARE_PROVIDER_SITE_OTHER): Payer: 59

## 2014-07-05 ENCOUNTER — Ambulatory Visit (INDEPENDENT_AMBULATORY_CARE_PROVIDER_SITE_OTHER): Payer: 59 | Admitting: Internal Medicine

## 2014-07-05 ENCOUNTER — Encounter: Payer: Self-pay | Admitting: Internal Medicine

## 2014-07-05 VITALS — BP 118/78 | HR 83 | Temp 98.6°F | Resp 16 | Ht 69.0 in | Wt 219.0 lb

## 2014-07-05 DIAGNOSIS — E559 Vitamin D deficiency, unspecified: Secondary | ICD-10-CM

## 2014-07-05 DIAGNOSIS — D509 Iron deficiency anemia, unspecified: Secondary | ICD-10-CM

## 2014-07-05 DIAGNOSIS — R739 Hyperglycemia, unspecified: Secondary | ICD-10-CM

## 2014-07-05 DIAGNOSIS — D529 Folate deficiency anemia, unspecified: Secondary | ICD-10-CM

## 2014-07-05 DIAGNOSIS — D519 Vitamin B12 deficiency anemia, unspecified: Secondary | ICD-10-CM

## 2014-07-05 DIAGNOSIS — M25559 Pain in unspecified hip: Secondary | ICD-10-CM

## 2014-07-05 DIAGNOSIS — E282 Polycystic ovarian syndrome: Secondary | ICD-10-CM

## 2014-07-05 LAB — BASIC METABOLIC PANEL
BUN: 11 mg/dL (ref 6–23)
CO2: 19 meq/L (ref 19–32)
Calcium: 8.9 mg/dL (ref 8.4–10.5)
Chloride: 108 mEq/L (ref 96–112)
Creatinine, Ser: 0.9 mg/dL (ref 0.4–1.2)
GFR: 100.7 mL/min (ref >60.00)
Glucose, Bld: 94 mg/dL (ref 70–99)
Potassium: 3.9 mEq/L (ref 3.5–5.1)
SODIUM: 140 meq/L (ref 135–145)

## 2014-07-05 LAB — CBC WITH DIFFERENTIAL/PLATELET
Basophils Absolute: 0 10*3/uL (ref 0.0–0.1)
Basophils Relative: 0.4 % (ref 0.0–3.0)
EOS ABS: 0.1 10*3/uL (ref 0.0–0.7)
Eosinophils Relative: 1.1 % (ref 0.0–5.0)
HCT: 27.6 % — ABNORMAL LOW (ref 36.0–46.0)
Hemoglobin: 8.8 g/dL — ABNORMAL LOW (ref 12.0–15.0)
LYMPHS PCT: 43.8 % (ref 12.0–46.0)
Lymphs Abs: 2.2 10*3/uL (ref 0.7–4.0)
MCHC: 31.8 g/dL (ref 30.0–36.0)
MCV: 75.1 fl — ABNORMAL LOW (ref 78.0–100.0)
MONOS PCT: 6.1 % (ref 3.0–12.0)
Monocytes Absolute: 0.3 10*3/uL (ref 0.1–1.0)
Neutro Abs: 2.5 10*3/uL (ref 1.4–7.7)
Neutrophils Relative %: 48.6 % (ref 43.0–77.0)
PLATELETS: 286 10*3/uL (ref 150.0–400.0)
RBC: 3.67 Mil/uL — ABNORMAL LOW (ref 3.87–5.11)
RDW: 17.8 % — AB (ref 11.5–15.5)
WBC: 5.1 10*3/uL (ref 4.0–10.5)

## 2014-07-05 LAB — IBC PANEL
Iron: 55 ug/dL (ref 42–145)
Saturation Ratios: 10.4 % — ABNORMAL LOW (ref 20.0–50.0)
TRANSFERRIN: 378.8 mg/dL — AB (ref 212.0–360.0)

## 2014-07-05 LAB — FERRITIN: FERRITIN: 3.8 ng/mL — AB (ref 10.0–291.0)

## 2014-07-05 MED ORDER — CYANOCOBALAMIN-SALCAPROZATE 1000-100 MCG-MG PO TABS: 1.0000 | ORAL_TABLET | Freq: Every morning | ORAL | Status: DC

## 2014-07-05 MED ORDER — CYANOCOBALAMIN 1000 MCG/ML IJ SOLN
1000.0000 ug | Freq: Once | INTRAMUSCULAR | Status: AC
  Administered 2014-07-05: 1000 ug via INTRAMUSCULAR

## 2014-07-05 MED ORDER — FOLIC ACID 1 MG PO TABS: 1.0000 mg | ORAL_TABLET | Freq: Every day | ORAL | Status: DC

## 2014-07-05 MED ORDER — FERRALET 90 90-1 MG PO TABS: 1.0000 | ORAL_TABLET | Freq: Every day | ORAL | Status: DC

## 2014-07-05 MED ORDER — HYDROCODONE-ACETAMINOPHEN 5-325 MG PO TABS: ORAL_TABLET | ORAL | Status: DC

## 2014-07-05 MED ORDER — CHOLECALCIFEROL 1.25 MG (50000 UT) PO TABS: 1.0000 | ORAL_TABLET | ORAL | Status: DC

## 2014-07-05 NOTE — Progress Notes (Signed)
Subjective:    Patient ID: Anna Nguyen, female    DOB: 03/21/1986, 28 y.o.   MRN: [id number]  Anemia Presents for follow-up visit. Symptoms include abdominal pain. There has been no anorexia, bruising/bleeding easily, confusion, fever, leg swelling, light-headedness, malaise/fatigue, pallor, palpitations, paresthesias, pica or weight loss. (She has intermittent bilateral pelvic pain that is associated with her menses, this has been attributed to PCOS and continues to occur intermittently but it has not changed or worsened lately, when this flares up she takes a dose of norco and the pain resolves.) Signs of blood loss that are present include menorrhagia. Signs of blood loss that are not present include hematemesis, hematochezia, melena and vaginal bleeding. Past treatments include oral vitamin B12, parenteral vitamin B12 and oral iron supplements. Compliance problems include psychosocial issues.  Compliance with medications is 26-50%.      Review of Systems  Constitutional: Negative.  Negative for fever, chills, weight loss, malaise/fatigue, diaphoresis, appetite change and fatigue.  HENT: Negative.   Eyes: Negative.   Respiratory: Negative.  Negative for cough, choking, chest tightness, shortness of breath and stridor.   Cardiovascular: Negative.  Negative for chest pain, palpitations and leg swelling.  Gastrointestinal: Positive for abdominal pain. Negative for nausea, vomiting, diarrhea, constipation, blood in stool, melena, hematochezia, abdominal distention, anal bleeding, rectal pain, anorexia and hematemesis.  Endocrine: Negative.   Genitourinary: Positive for menstrual problem, pelvic pain and menorrhagia. Negative for dysuria, urgency, frequency, hematuria, flank pain, decreased urine volume, vaginal bleeding, enuresis, difficulty urinating, genital sores, vaginal pain and dyspareunia.  Musculoskeletal: Negative.  Negative for arthralgias, back pain, gait problem, joint swelling,  myalgias, neck pain and neck stiffness.  Skin: Negative.  Negative for pallor.  Neurological: Negative.  Negative for light-headedness and paresthesias.  Hematological: Negative.  Negative for adenopathy. Does not bruise/bleed easily.  Psychiatric/Behavioral: Negative.  Negative for confusion.       Objective:   Physical Exam  Vitals reviewed. Constitutional: She is oriented to person, place, and time. She appears well-developed and well-nourished.  Non-toxic appearance. She does not have a sickly appearance. She does not appear ill. No distress.  HENT:  Head: Normocephalic and atraumatic.  Mouth/Throat: Oropharynx is clear and moist. No oropharyngeal exudate.  Eyes: Conjunctivae are normal. Right eye exhibits no discharge. Left eye exhibits no discharge. No scleral icterus.  Neck: Normal range of motion. Neck supple. No JVD present. No tracheal deviation present. No thyromegaly present.  Cardiovascular: Normal rate, regular rhythm, normal heart sounds and intact distal pulses.  Exam reveals no gallop and no friction rub.   No murmur heard. Pulmonary/Chest: Effort normal and breath sounds normal. No stridor. No respiratory distress. She has no wheezes. She has no rales. She exhibits no tenderness.  Abdominal: Soft. Bowel sounds are normal. She exhibits no distension and no mass. There is no tenderness. There is no rebound and no guarding.  Musculoskeletal: Normal range of motion. She exhibits no edema and no tenderness.  Lymphadenopathy:    She has no cervical adenopathy.  Neurological: She is oriented to person, place, and time.  Skin: Skin is warm and dry. No rash noted. She is not diaphoretic. No erythema. No pallor.  Psychiatric: She has a normal mood and affect. Her behavior is normal. Judgment and thought content normal.     Lab Results  Component Value Date   WBC 6.0 03/08/2014   HGB 10.3* 03/08/2014   HCT 32.5* 03/08/2014   PLT 270.0 03/08/2014   GLUCOSE 106* 07/13/2013  CHOL 149  07/13/2012   TRIG 74.0 07/13/2012   HDL 75.90 07/13/2012   LDLCALC 58 07/13/2012   ALT 21 07/13/2013   AST 20 07/13/2013   NA 135 07/13/2013   K 3.9 07/13/2013   CL 103 07/13/2013   CREATININE 0.7 07/13/2013   BUN 13 07/13/2013   CO2 28 07/13/2013   TSH 1.16 07/13/2012   HGBA1C 5.1 07/13/2012   MICROALBUR 1.13 10/03/2008       Assessment & Plan:

## 2014-07-05 NOTE — Patient Instructions (Signed)
Iron Deficiency Anemia Anemia is a condition in which there are less red blood cells or hemoglobin in the blood than normal. Hemoglobin is the part of red blood cells that carries oxygen. Iron deficiency anemia is anemia caused by too little iron. It is the most common type of anemia. It may leave you tired and short of breath. CAUSES   Lack of iron in the diet.  Poor absorption of iron, as seen with intestinal disorders.  Intestinal bleeding.  Heavy periods. SIGNS AND SYMPTOMS  Mild anemia may not be noticeable. Symptoms may include:  Fatigue.  Headache.  Pale skin.  Weakness.  Tiredness.  Shortness of breath.  Dizziness.  Cold hands and feet.  Fast or irregular heartbeat. DIAGNOSIS  Diagnosis requires a thorough evaluation and physical exam by your health care provider. Blood tests are generally used to confirm iron deficiency anemia. Additional tests may be done to find the underlying cause of your anemia. These may include:  Testing for blood in the stool (fecal occult blood test).  A procedure to see inside the colon and rectum (colonoscopy).  A procedure to see inside the esophagus and stomach (endoscopy). TREATMENT  Iron deficiency anemia is treated by correcting the cause of the deficiency. Treatment may involve:  Adding iron-rich foods to your diet.  Taking iron supplements. Pregnant or breastfeeding women need to take extra iron because their normal diet usually does not provide the required amount.  Taking vitamins. Vitamin C improves the absorption of iron. Your health care provider may recommend that you take your iron tablets with a glass of orange juice or vitamin C supplement.  Medicines to make heavy menstrual flow lighter.  Surgery. HOME CARE INSTRUCTIONS   Take iron as directed by your health care provider.  If you cannot tolerate taking iron supplements by mouth, talk to your health care provider about taking them through a vein  (intravenously) or an injection into a muscle.  For the best iron absorption, iron supplements should be taken on an empty stomach. If you cannot tolerate them on an empty stomach, you may need to take them with food.  Do not drink milk or take antacids at the same time as your iron supplements. Milk and antacids may interfere with the absorption of iron.  Iron supplements can cause constipation. Make sure to include fiber in your diet to prevent constipation. A stool softener may also be recommended.  Take vitamins as directed by your health care provider.  Eat a diet rich in iron. Foods high in iron include liver, lean beef, whole-grain bread, eggs, dried fruit, and dark green leafy vegetables. SEEK IMMEDIATE MEDICAL CARE IF:   You faint. If this happens, do not drive. Call your local emergency services (911 in [place] if no other help is available.  You have chest pain.  You feel nauseous or vomit.  You have severe or increased shortness of breath with activity.  You feel weak.  You have a rapid heartbeat.  You have unexplained sweating.  You become light-headed when getting up from a chair or bed. MAKE SURE YOU:   Understand these instructions.  Will watch your condition.  Will get help right away if you are not doing well or get worse. Document Released: 08/22/2000 Document Revised: 08/30/2013 Document Reviewed: 05/02/2013 [employer] Patient Information 2015 [employer]. This information is not intended to replace advice given to you by your health care provider. Make sure you discuss any questions you have with your health care provider.  

## 2014-07-05 NOTE — Progress Notes (Signed)
Pre visit review using our clinic review tool, if applicable. No additional management support is needed unless otherwise documented below in the visit note. 

## 2014-07-06 ENCOUNTER — Encounter: Payer: Self-pay | Admitting: Internal Medicine

## 2014-07-06 NOTE — Assessment & Plan Note (Signed)
She will cont norco as needed for pain 

## 2014-07-06 NOTE — Assessment & Plan Note (Signed)
Her anemia has worsened, I have asked her to be more compliant with the B 12 therapy

## 2014-07-06 NOTE — Assessment & Plan Note (Signed)
Anemia has worsened and ferritin level is down though iron is still in the normal range I have asked her to be more compliant with the iron therapy

## 2014-08-06 ENCOUNTER — Telehealth: Payer: Self-pay | Admitting: Internal Medicine

## 2014-08-06 DIAGNOSIS — M25559 Pain in unspecified hip: Secondary | ICD-10-CM

## 2014-08-06 DIAGNOSIS — E282 Polycystic ovarian syndrome: Secondary | ICD-10-CM

## 2014-08-07 MED ORDER — HYDROCODONE-ACETAMINOPHEN 5-325 MG PO TABS: ORAL_TABLET | ORAL | Status: DC

## 2014-08-07 NOTE — Telephone Encounter (Signed)
You will need to come pick up the Rx

## 2014-08-08 ENCOUNTER — Ambulatory Visit: Payer: Self-pay | Admitting: Internal Medicine

## 2014-08-11 ENCOUNTER — Ambulatory Visit: Payer: 59

## 2014-08-29 ENCOUNTER — Other Ambulatory Visit: Payer: Self-pay | Admitting: Internal Medicine

## 2014-08-29 DIAGNOSIS — M25559 Pain in unspecified hip: Secondary | ICD-10-CM

## 2014-08-29 DIAGNOSIS — E282 Polycystic ovarian syndrome: Secondary | ICD-10-CM

## 2014-08-29 MED ORDER — HYDROCODONE-ACETAMINOPHEN 5-325 MG PO TABS: ORAL_TABLET | ORAL | Status: DC

## 2014-08-30 NOTE — Telephone Encounter (Signed)
Called pt no answer [facility] rx ready for pick-up.../lmb 

## 2014-09-26 ENCOUNTER — Other Ambulatory Visit: Payer: Self-pay | Admitting: Internal Medicine

## 2014-09-26 DIAGNOSIS — E282 Polycystic ovarian syndrome: Secondary | ICD-10-CM

## 2014-09-26 DIAGNOSIS — M25559 Pain in unspecified hip: Secondary | ICD-10-CM

## 2014-09-26 MED ORDER — HYDROCODONE-ACETAMINOPHEN 5-325 MG PO TABS: ORAL_TABLET | ORAL | Status: DC

## 2014-10-25 ENCOUNTER — Encounter: Payer: Self-pay | Admitting: Internal Medicine

## 2014-10-25 ENCOUNTER — Ambulatory Visit (INDEPENDENT_AMBULATORY_CARE_PROVIDER_SITE_OTHER): Payer: 59 | Admitting: Internal Medicine

## 2014-10-25 VITALS — BP 142/90 | HR 80 | Temp 98.5°F | Resp 16 | Wt 220.0 lb

## 2014-10-25 DIAGNOSIS — D509 Iron deficiency anemia, unspecified: Secondary | ICD-10-CM

## 2014-10-25 DIAGNOSIS — M25559 Pain in unspecified hip: Secondary | ICD-10-CM

## 2014-10-25 DIAGNOSIS — D529 Folate deficiency anemia, unspecified: Secondary | ICD-10-CM

## 2014-10-25 DIAGNOSIS — D519 Vitamin B12 deficiency anemia, unspecified: Secondary | ICD-10-CM

## 2014-10-25 DIAGNOSIS — E282 Polycystic ovarian syndrome: Secondary | ICD-10-CM

## 2014-10-25 DIAGNOSIS — Z9884 Bariatric surgery status: Secondary | ICD-10-CM

## 2014-10-25 DIAGNOSIS — E559 Vitamin D deficiency, unspecified: Secondary | ICD-10-CM

## 2014-10-25 MED ORDER — HYDROCODONE-ACETAMINOPHEN 5-325 MG PO TABS: ORAL_TABLET | ORAL | Status: DC

## 2014-10-25 NOTE — Patient Instructions (Signed)
Polycystic Ovarian Syndrome  Polycystic ovarian syndrome (PCOS) is a common hormonal disorder among women of reproductive age. Most women with PCOS grow many small cysts on their ovaries. PCOS can cause problems with your periods and make it difficult to get pregnant. It can also cause an increased risk of miscarriage with pregnancy. If left untreated, PCOS can lead to serious health problems, such as diabetes and heart disease.  CAUSES  The cause of PCOS is not fully understood, but genetics may be a factor.  SIGNS AND SYMPTOMS    Infrequent or no menstrual periods.    Inability to get pregnant (infertility) because of not ovulating.    Increased growth of hair on the face, chest, stomach, back, thumbs, thighs, or toes.    Acne, oily skin, or dandruff.    Pelvic pain.    Weight gain or obesity, usually carrying extra weight around the waist.    Type 2 diabetes.    High cholesterol.    High blood pressure.    Female-pattern baldness or thinning hair.    Patches of thickened and dark brown or black skin on the neck, arms, breasts, or thighs.    Tiny excess flaps of skin (skin tags) in the armpits or neck area.    Excessive snoring and having breathing stop at times while asleep (sleep apnea).    Deepening of the voice.    Gestational diabetes when pregnant.   DIAGNOSIS   There is no single test to diagnose PCOS.    Your health care provider will:    Take a medical history.    Perform a pelvic exam.    Have ultrasonography done.    Check your female and female hormone levels.    Measure glucose or sugar levels in the blood.    Do other blood tests.    If you are producing too many female hormones, your health care provider will make sure it is from PCOS. At the physical exam, your health care provider will want to evaluate the areas of increased hair growth. Try to allow natural hair growth for a few days before the visit.    During a pelvic exam, the ovaries may be  enlarged or swollen because of the increased number of small cysts. This can be seen more easily by using vaginal ultrasonography or screening to examine the ovaries and lining of the uterus (endometrium) for cysts. The uterine lining may become thicker if you have not been having a regular period.   TREATMENT   Because there is no cure for PCOS, it needs to be managed to prevent problems. Treatments are based on your symptoms. Treatment is also based on whether you want to have a baby or whether you need contraception.   Treatment may include:    Progesterone hormone to start a menstrual period.    Birth control pills to make you have regular menstrual periods.    Medicines to make you ovulate, if you want to get pregnant.    Medicines to control your insulin.    Medicine to control your blood pressure.    Medicine and diet to control your high cholesterol and triglycerides in your blood.   Medicine to reduce excessive hair growth.   Surgery, making small holes in the ovary, to decrease the amount of female hormone production. This is done through a long, lighted tube (laparoscope) placed into the pelvis through a tiny incision in the lower abdomen.   HOME CARE INSTRUCTIONS   Only   take over-the-counter or prescription medicine as directed by your health care provider.   Pay attention to the foods you eat and your activity levels. This can help reduce the effects of PCOS.   Keep your weight under control.   Eat foods that are low in carbohydrate and high in fiber.   Exercise regularly.  SEEK MEDICAL CARE IF:   Your symptoms do not get better with medicine.   You have new symptoms.  Document Released: 12/19/2004 Document Revised: 06/15/2013 Document Reviewed: 02/10/2013  [employer] Patient Information 2015 [employer]. This information is not intended to replace advice given to you by your health care provider. Make sure you discuss any questions you have with your health care provider.

## 2014-10-25 NOTE — Progress Notes (Signed)
Pre visit review using our clinic review tool, if applicable. No additional management support is needed unless otherwise documented below in the visit note. 

## 2014-10-26 ENCOUNTER — Encounter: Payer: Self-pay | Admitting: Internal Medicine

## 2014-10-26 NOTE — Assessment & Plan Note (Signed)
Will recheck her labs to screen for vit deficiencies

## 2014-10-26 NOTE — Assessment & Plan Note (Signed)
Recheck her CBC and iron level

## 2014-10-26 NOTE — Progress Notes (Signed)
Subjective:    Patient ID: Anna Nguyen, female    DOB: [birth year], 29 y.o.   MRN: [id number]  Anemia Presents for follow-up visit. Symptoms include abdominal pain. There has been no anorexia, bruising/bleeding easily, confusion, fever, leg swelling, light-headedness, malaise/fatigue, pallor, palpitations, paresthesias, pica or weight loss. Signs of blood loss that are not present include hematemesis, hematochezia, melena, menorrhagia and vaginal bleeding. Past treatments include folic acid, oral iron supplements and parenteral vitamin B12.  Abdominal Pain Pertinent negatives include no anorexia, arthralgias, constipation, diarrhea, fever, frequency, hematochezia, hematuria, melena, myalgias, nausea, vomiting or weight loss.      Review of Systems  Constitutional: Negative.  Negative for fever, chills, weight loss, malaise/fatigue, diaphoresis, appetite change and fatigue.  HENT: Negative.   Eyes: Negative.   Respiratory: Negative.  Negative for cough, choking, chest tightness, shortness of breath and stridor.   Cardiovascular: Negative.  Negative for chest pain, palpitations and leg swelling.  Gastrointestinal: Positive for abdominal pain. Negative for nausea, vomiting, diarrhea, constipation, blood in stool, melena, hematochezia, abdominal distention, anal bleeding, rectal pain, anorexia and hematemesis.       She has intermittent episodes of sharp, stabbing pain in both lower quadrants, she believes this pain is related to PCOS, she previously saw a GYN but now she wants a second opinion. Norco controls this pain.  Endocrine: Negative.   Genitourinary: Negative.  Negative for frequency, hematuria, flank pain, vaginal bleeding, difficulty urinating and menorrhagia.  Musculoskeletal: Negative.  Negative for myalgias, back pain, joint swelling and arthralgias.  Skin: Negative.  Negative for pallor.  Allergic/Immunologic: Negative.   Neurological: Negative.  Negative for dizziness,  tremors, weakness, light-headedness, numbness and paresthesias.  Hematological: Negative.  Negative for adenopathy. Does not bruise/bleed easily.  Psychiatric/Behavioral: Negative.  Negative for confusion.       Objective:   Physical Exam  Constitutional: She is oriented to person, place, and time. She appears well-developed and well-nourished. No distress.  HENT:  Head: Normocephalic and atraumatic.  Mouth/Throat: Oropharynx is clear and moist. No oropharyngeal exudate.  Eyes: Conjunctivae are normal. Right eye exhibits no discharge. Left eye exhibits no discharge. No scleral icterus.  Neck: Normal range of motion. Neck supple. No JVD present. No tracheal deviation present. No thyromegaly present.  Cardiovascular: Normal rate, regular rhythm, normal heart sounds and intact distal pulses.  Exam reveals no gallop and no friction rub.   No murmur heard. Pulmonary/Chest: Effort normal and breath sounds normal. No stridor. No respiratory distress. She has no wheezes. She has no rales. She exhibits no tenderness.  Abdominal: Soft. Bowel sounds are normal. She exhibits no distension and no mass. There is no tenderness. There is no rebound and no guarding.  Musculoskeletal: Normal range of motion. She exhibits no edema or tenderness.  Lymphadenopathy:    She has no cervical adenopathy.  Neurological: She is oriented to person, place, and time.  Skin: Skin is warm and dry. No rash noted. She is not diaphoretic. No erythema. No pallor.  Psychiatric: She has a normal mood and affect. Her behavior is normal. Judgment and thought content normal.  Nursing note and vitals reviewed.    Lab Results  Component Value Date   WBC 5.1 07/05/2014   HGB 8.8 Repeated and verified X2.* 07/05/2014   HCT 27.6* 07/05/2014   PLT 286.0 07/05/2014   GLUCOSE 94 07/05/2014   CHOL 149 07/13/2012   TRIG 74.0 07/13/2012   HDL 75.90 07/13/2012   LDLCALC 58 07/13/2012   ALT 21 07/13/2013  AST 20 07/13/2013   NA  140 07/05/2014   K 3.9 07/05/2014   CL 108 07/05/2014   CREATININE 0.9 07/05/2014   BUN 11 07/05/2014   CO2 19 07/05/2014   TSH 1.16 07/13/2012   HGBA1C 5.1 07/13/2012   MICROALBUR 1.13 10/03/2008       Assessment & Plan:

## 2014-10-26 NOTE — Assessment & Plan Note (Signed)
Recheck her CBC today Cont monthly B12 injections

## 2014-10-26 NOTE — Assessment & Plan Note (Signed)
Will cont norco as needed for pain Will order an U/S to see if there are ovarian cysts

## 2014-10-26 NOTE — Assessment & Plan Note (Signed)
Will recheck her VIt D level

## 2014-10-26 NOTE — Assessment & Plan Note (Signed)
Recheck her CBC and folate level

## 2014-10-30 ENCOUNTER — Ambulatory Visit: Payer: Self-pay | Admitting: Internal Medicine

## 2014-10-30 ENCOUNTER — Other Ambulatory Visit: Payer: Self-pay | Admitting: Internal Medicine

## 2014-10-30 DIAGNOSIS — M25559 Pain in unspecified hip: Secondary | ICD-10-CM

## 2014-10-30 DIAGNOSIS — E282 Polycystic ovarian syndrome: Secondary | ICD-10-CM

## 2014-11-01 ENCOUNTER — Encounter: Payer: Self-pay | Admitting: Internal Medicine

## 2014-11-01 ENCOUNTER — Ambulatory Visit
Admission: RE | Admit: 2014-11-01 | Discharge: 2014-11-01 | Disposition: A | Discharge status: Home / Self Care | Payer: 59 | Source: Ambulatory Visit | Attending: Internal Medicine | Admitting: Internal Medicine

## 2014-11-01 DIAGNOSIS — M25559 Pain in unspecified hip: Secondary | ICD-10-CM

## 2014-11-01 DIAGNOSIS — E282 Polycystic ovarian syndrome: Secondary | ICD-10-CM

## 2014-11-07 ENCOUNTER — Other Ambulatory Visit: Payer: Self-pay | Admitting: Internal Medicine

## 2014-11-07 ENCOUNTER — Telehealth: Payer: Self-pay | Admitting: Internal Medicine

## 2014-11-07 ENCOUNTER — Other Ambulatory Visit (INDEPENDENT_AMBULATORY_CARE_PROVIDER_SITE_OTHER): Payer: 59

## 2014-11-07 ENCOUNTER — Ambulatory Visit (INDEPENDENT_AMBULATORY_CARE_PROVIDER_SITE_OTHER): Payer: 59 | Admitting: Internal Medicine

## 2014-11-07 ENCOUNTER — Encounter: Payer: Self-pay | Admitting: Internal Medicine

## 2014-11-07 VITALS — BP 122/86 | HR 92 | Temp 98.2°F | Resp 12 | Wt 221.8 lb

## 2014-11-07 DIAGNOSIS — Z9884 Bariatric surgery status: Secondary | ICD-10-CM

## 2014-11-07 DIAGNOSIS — D529 Folate deficiency anemia, unspecified: Secondary | ICD-10-CM | POA: Diagnosis not present

## 2014-11-07 DIAGNOSIS — E559 Vitamin D deficiency, unspecified: Secondary | ICD-10-CM | POA: Diagnosis not present

## 2014-11-07 DIAGNOSIS — E282 Polycystic ovarian syndrome: Secondary | ICD-10-CM

## 2014-11-07 DIAGNOSIS — D509 Iron deficiency anemia, unspecified: Secondary | ICD-10-CM | POA: Diagnosis not present

## 2014-11-07 DIAGNOSIS — D519 Vitamin B12 deficiency anemia, unspecified: Secondary | ICD-10-CM

## 2014-11-07 LAB — CBC WITH DIFFERENTIAL/PLATELET
Basophils Absolute: 0 10*3/uL (ref 0.0–0.1)
Basophils Relative: 0.4 % (ref 0.0–3.0)
EOS PCT: 1.1 % (ref 0.0–5.0)
Eosinophils Absolute: 0.1 10*3/uL (ref 0.0–0.7)
HEMATOCRIT: 29.9 % — AB (ref 36.0–46.0)
HEMOGLOBIN: 9.6 g/dL — AB (ref 12.0–15.0)
LYMPHS ABS: 2.2 10*3/uL (ref 0.7–4.0)
Lymphocytes Relative: 42.3 % (ref 12.0–46.0)
MCHC: 32.2 g/dL (ref 30.0–36.0)
MCV: 75 fl — ABNORMAL LOW (ref 78.0–100.0)
MONOS PCT: 6.3 % (ref 3.0–12.0)
Monocytes Absolute: 0.3 10*3/uL (ref 0.1–1.0)
NEUTROS ABS: 2.6 10*3/uL (ref 1.4–7.7)
Neutrophils Relative %: 49.9 % (ref 43.0–77.0)
Platelets: 276 10*3/uL (ref 150.0–400.0)
RBC: 3.98 Mil/uL (ref 3.87–5.11)
RDW: 20.1 % — ABNORMAL HIGH (ref 11.5–15.5)
WBC: 5.3 10*3/uL (ref 4.0–10.5)

## 2014-11-07 LAB — FERRITIN: Ferritin: 6.2 ng/mL — ABNORMAL LOW (ref 10.0–291.0)

## 2014-11-07 LAB — BASIC METABOLIC PANEL
BUN: 10 mg/dL (ref 6–23)
CALCIUM: 9.4 mg/dL (ref 8.4–10.5)
CO2: 27 mEq/L (ref 19–32)
Chloride: 105 mEq/L (ref 96–112)
Creatinine, Ser: 0.75 mg/dL (ref 0.40–1.20)
GFR: 117.64 mL/min (ref >60.00)
GLUCOSE: 99 mg/dL (ref 70–99)
Potassium: 3.8 mEq/L (ref 3.5–5.1)
SODIUM: 137 meq/L (ref 135–145)

## 2014-11-07 LAB — VITAMIN B12: Vitamin B-12: 156 pg/mL — ABNORMAL LOW (ref 211–911)

## 2014-11-07 LAB — IBC PANEL
Iron: 24 ug/dL — ABNORMAL LOW (ref 42–145)
Saturation Ratios: 4.6 % — ABNORMAL LOW (ref 20.0–50.0)
Transferrin: 373 mg/dL — ABNORMAL HIGH (ref 212.0–360.0)

## 2014-11-07 LAB — VITAMIN D 25 HYDROXY (VIT D DEFICIENCY, FRACTURES): VITD: 17.45 ng/mL — AB (ref 30.00–100.00)

## 2014-11-07 LAB — TSH: TSH: 0.52 u[IU]/mL (ref 0.35–4.50)

## 2014-11-07 LAB — HEMOGLOBIN A1C: HEMOGLOBIN A1C: 6.1 % (ref 4.6–6.5)

## 2014-11-07 LAB — HCG, QUANTITATIVE, PREGNANCY: Quantitative HCG: 0.21 m[IU]/mL

## 2014-11-07 LAB — FOLATE: Folate: 14.2 ng/mL (ref >5.9)

## 2014-11-07 NOTE — Telephone Encounter (Signed)
Patient seen Dr. Elvera LennoxGherghe for hormone management.  On the AVS today Gherghe states Dr. Yetta BarreJones would need to prescribe xanax.  Patient states Gherghe told her this is part of her hormone management, but she can not prescribe it.  Patient does not want to make another OV if possible.  Please advise.

## 2014-11-07 NOTE — Progress Notes (Signed)
Patient ID: Anna Nguyen, female   DOB: March 11, 1986, 29 y.o.   MRN: [id number]  HPI: Anna Nguyen is a 29 y.o.-year-old female, referred by her PCP, Dr. Yetta Barre, for management of PCOS.  I previously saw her for DM/hypoglycemia, last visit 08/2013.  Reviewed records from Dr Juliene Pina: - Patient with history of PCOS, with menstrual abnormalities since approximately 02/2013. They have been normal previously after losing weight post GBP. She has a history of an elective miscarriage in 08/18/2011 due to hyperemesis. She has been tried on metformin for PCOS, however she could not tolerate it. She also could not tolerate oral contraceptives and did not like Mirena in the past. She is now on prn Provera to induce menstrual cycles. Method of contraception: withdrawal. She has had a pelvic ultrasound on 03/08/2013 that showed ovarian cysts. A Pap smear was normal on 07/12/2013. - pt also complained of fluctuations in her CBGs, especially after trying metformin for PCOS. Insulin level on 05/06/2013 was 32.4 (2.6-24.9). There was no associated glucose level with this.   Weight gain: - GBP in 2012 >> lost 145 lbs >> gained 20 lbs back, but now stable - Meals - improved recently  Fertility/Menstrual cycles: - no menses until 29 y/o. She started OCP at 29 y/o by [place]. She felt "crazy" on the OCPs >> sent to a therapist >> went back to [facility] >> switched OCPs + Prozac >> but she did not want to take the SSRI >> got IUD >> emergently removed for a infection >> She was on Provera >> stopped working >> switched to NuvaRing >> but anxiety and irritability. If not on birth control >> getting 1 cycle/3 mo.  - + h/o ovarian cysts per TV U/S x2 (last ultrasound this month)- has AP from them >> on Norco - children: 0 - miscarriages: 1- interruption - contraception: none now  Acne: - + fair on sides of face >> improved after losing weight   Hirsutism: - only on chin - Using Darene Lamer   Treatments tried: - could not  tolerate Metformin >> N/V/D - did not try Spironolactone - did not try [place] - not on OCPs now  Other meds: - SSRIs: no  - Last thyroid tests: Lab Results  Component Value Date   TSH 1.16 07/13/2012   - Last set of lipids:    Component Value Date/Time   CHOL 149 07/13/2012 1039   TRIG 74.0 07/13/2012 1039   HDL 75.90 07/13/2012 1039   CHOLHDL 2 07/13/2012 1039   VLDL 14.8 07/13/2012 1039   LDLCALC 58 07/13/2012 1039   - Last HbA1c: Lab Results  Component Value Date   HGBA1C 5.1 07/13/2012   ROS: Constitutional: + weight gain, + fatigue, no subjective hyperthermia/hypothermia Eyes: no blurry vision, no xerophthalmia ENT: no sore throat, no nodules palpated in throat, no dysphagia/odynophagia, no hoarseness Cardiovascular: no CP/+ SOB/no palpitations/leg swelling Respiratory: no cough/+ SOB Gastrointestinal: no N/V/D/C Musculoskeletal: no muscle/joint aches Skin: + acne scars, + hair on face Neurological: no tremors/numbness/tingling/dizziness Psychiatric: no depression/+ anxiety +  Low libido  I reviewed pt's medications, allergies, PMH, social hx, family hx, and changes were documented in the history of present illness. Otherwise, unchanged from my initial visit note: Past Medical History  Diagnosis Date  . Anemia, iron deficiency   . Type II or unspecified type diabetes mellitus without mention of complication, not stated as uncontrolled   . HTN (hypertension)   . PCOS (polycystic ovarian syndrome)   . Pregnant state, incidental  Past Surgical History  Procedure Laterality Date  . Tonsillectomy    . Laparoscopic gastric banding     History   Social History  . Marital Status: Single    Spouse Name: N/A  . Number of Children: 0   Occupational History  . SALES     [employer]   Social History Main Topics  . Smoking status: Never Smoker   . Smokeless tobacco: Never Used  . Alcohol Use: No  . Drug Use: No  . Sexual Activity:    Partners: Male    Current Outpatient Prescriptions on File Prior to Visit  Medication Sig Dispense Refill  . Cholecalciferol 50000 UNITS TABS Take 1 tablet by mouth once a week. 12 tablet 3  . cyanocobalamin (,VITAMIN B-12,) 1000 MCG/ML injection Inject 1 mL (1,000 mcg total) into the muscle every 30 (thirty) days. 10 mL 1  . etonogestrel-ethinyl estradiol (NUVARING) 0.12-0.015 MG/24HR vaginal ring INSERT 1 RING VAGINALLY AND LEAVE IN PLACE FOR 3 WEEKS, THEN REMOVE FOR 1 WEEK 3 each 12  . folic acid (FOLVITE) 1 MG tablet Take 1 tablet (1 mg total) by mouth daily. 90 tablet 3  . glucose blood test strip Use as instructed 200 each prn  . HYDROcodone-acetaminophen (NORCO/VICODIN) 5-325 MG per tablet TAKE 1 TABLET BY MOUTH EVERY 6 HOURS AS NEEDED FOR PAIN 100 tablet 0  . IRON, FERROUS GLUCONATE, PO Take by mouth 1 day or 1 dose.    . Multiple Vitamin (MULTI VITAMIN DAILY PO) Take by mouth 1 day or 1 dose.    . ONE TOUCH LANCETS MISC Check sugars 5x a day 200 each prn  . SYRINGE-NEEDLE, DISP, 3 ML (BD ECLIPSE SYRINGE) 25G X 5/8" 3 ML MISC Use once monthly with B12 injection 12 each 0   No current facility-administered medications on file prior to visit.   Allergies  Allergen Reactions  . Aspirin     Per surgeon  . Latex   . Nsaids     Per [facility].   Family History  Problem Relation Age of Onset  . Arthritis Other   . Diabetes Other   . Hypertension Other   . Hyperlipidemia Other   . Prostate cancer Other   . Cancer Maternal Aunt     breast   PE: BP 122/86 mmHg  Pulse 92  Temp(Src) 98.2 F (36.8 C) (Oral)  Resp 12  Wt 221 lb 12.8 oz (100.608 kg)  SpO2 99%  LMP  Body mass index is 32.74 kg/(m^2). Wt Readings from Last 3 Encounters:  11/07/14 221 lb 12.8 oz (100.608 kg)  10/25/14 220 lb (99.791 kg)  07/05/14 219 lb (99.338 kg)   Constitutional: overweight, in NAD, no full supraclavicular fat pads Eyes: PERRLA, EOMI, no exophthalmos ENT: moist mucous membranes, no thyromegaly, no cervical  lymphadenopathy Cardiovascular: RRR, No MRG Respiratory: CTA B Gastrointestinal: abdomen soft, NT, ND, BS+ Musculoskeletal: no deformities, strength intact in all 4 Skin: moist, warm; + acne scars on face, + dark terminal hair on chin, + vellum on sideburns, + acanthosis nigricans, no purple, wide, stretch marks Neurological: no tremor with outstretched hands, DTR normal in all 4  ASSESSMENT: 1. PCOS  PLAN: 1.  I had a long discussion with the patient about the fact that the PCOS is a sum of several conditions, including:  weight gain  insulin resistance (and therefore a higher risk of developing diabetes later in life)  acne  hirsutism  irregular menstrual cycles  decreased fertility. - We also discussed  about the fact that the treatment is usually targeted to addressing the problem that concerns the patient the most: acne/hirsutism, weight gain, or fertility, but there is no single treatment for PCOS.  - The first-line therapy are oral contraceptives. If pt is concerned with her weight, we can use metformin; if she is concerned about acne/hirsutism, we can add spironolactone; and if she is concerned about fertility, I could refer her to reproductive endocrinology for possible use of clomiphene. - In her case, she had gastric bypass surgery and lost a lot of weight and would like to avoid metformin due to previous GI side effects. - She also is not feeling well on oral contraceptives. She tried several of them and also tried an IUD, and NuvaRing and just Provera but she doesn't like any of them because they affect her personality. - She has irritability and mood swings. These are accentuated by oral contraceptives,  as mentioned above, however  she has similar symptoms that are intolerable even without being on  OCPs. This is actually her main complaint. She mentions that she has mood swings for no reason (and actually cries today in the office) and she really wants something to be  done about this. Since she does have one cycle approximately every 3 months off the oral contraceptives, we can continue without them. We can continue this for a year and if she does not have > 4 menses in the next year, we can try a 10 day Provera challenge every 3 months. She agrees with this. I also suggested to add spironolactone to block the effect of testosterone, however I underlined the need to use good contraception while on spironolactone and I explained the negative effects that he can have on the baby if she were to get pregnant. I repeated this several times to make sure she understands that it's very important to use contraception. She agrees to restart the NuvaRing while on spironolactone. - I also advised her to discuss with Dr. Yetta Barre about starting her on a medication for anxiety. I do not usually treat or follow patients for anxiety, but she does appear anxious and would probably benefit from something like Xanax, however I would leave this at the latitude of her primary care doctor. - She also complains about abdominal pain which is bilateral and radiating down medial thighs. She has me whether this can be due to the cyst in her ovaries, and this might be, however, other than using OCPs to reduce ovarian stimulation, we do not have other options for treatment aside pain medicines. - For now, I will check the following labs: Orders Placed This Encounter  Procedures  . LH  . FSH  . Estradiol  . Testosterone, Free, Total, SHBG  . Prolactin  . 17-Hydroxyprogesterone  . HgB A1c  . hCG, quantitative, pregnancy  and I will let her know if we can start spironolactone after the labs are back. If we do start, I advised her that it takes a long time for spironolactone to have an effect on her hair follicles, however, it definitely has a faster effect on her mood swings. We'll need to have her back for a nurse visit for blood pressure check and also for a potassium level if we are to start this  medication. - I will see the patient back in 6 months but will be in touch with her regarding her labs  - time spent with the patient: 1 hour, of which >50% was spent in obtaining  information about her symptoms, reviewing her previous labs, evaluations, and treatments, counseling her about her condition (please see the discussed topics above), and developing a plan to further investigate and treat it. She had a number of questions which I addressed.  Office Visit on 11/07/2014  Component Date Value Ref Range Status  . LH 11/07/2014 8.03   Final   Comment: Female Reference Range:20-70 yrs     1.5-9.3 mIU/mL>70 yrs       3.1-35.6 mIU/mLFemale Reference Range:Follicular Phase     1.9-12.5 mIU/mLMidcycle             8.7-76.3 mIU/mLLuteal Phase         0.5-16.9 mIU/mL  Post Menopausal      15.9-54.0  mIU/mLPregnant             <1.5 mIU/mLContraceptives       0.7-5.6 mIU/mL   . [facility] 11/07/2014 4.8   Final   Female Reference Range:  1.4-18.1 mIU/mLFemale Reference Range:Follicular Phase          2.5-10.2 mIU/mLMidCycle Peak          3.4-33.4 mIU/mLLuteal Phase          1.5-9.1 mIU/mLPost Menopausal     23.0-116.3 mIU/mLPregnant          <0.3 mIU/mL  . Estradiol, Free 11/07/2014 0.65   Final   Comment:   FEMALE REFERENCE RANGES FOR ESTRADIOL, FREE:     Follicular Stage:  0.43-5.03 pg/mL   Mid-cycle Stage:   0.72-5.89 pg/mL   Luteal Stage:      0.40-5.55 pg/mL   Postmenopausal:    < or = 0.38 pg/mL   . Estradiol 11/07/2014 48   Final   Comment:   FEMALE REFERENCE RANGES FOR ESTRADIOL:     Follicular Stage:  39-375 pg/mL   Mid-cycle Stage:   94-762 pg/mL   Luteal Stage:      48-440 pg/mL   Postmenopausal:    < or = 10 pg/mL   . Testosterone 11/07/2014 30  10 - 70 ng/dL Final   Comment:           Tanner Stage       Female              Female               I              < 30 ng/dL        < 10 ng/dL               II             < 150 ng/dL       < 30 ng/dL               III            100-320 ng/dL      < 35 ng/dL               IV             200-970 ng/dL     29-56 ng/dL               V/Adult        300-890 ng/dL     21-30 ng/dL     . Sex Hormone Binding 11/07/2014 73  17 - 124 nmol/L Final  . Testosterone, Free 11/07/2014 3.1  0.6 - 6.8 pg/mL Final   Comment:  The concentration of free testosterone is derived from a mathematical expression based on constants for the binding of testosterone to sex hormone-binding globulin and albumin.   . Testosterone-% Free 11/07/2014 1.0  0.4 - 2.4 % Final  . Prolactin 11/07/2014 2.7   Final   Comment:      Reference Ranges:                  Female:                       2.1 -  17.1 ng/ml                  Female:   Pregnant          9.7 - 208.5 ng/mL                            Non Pregnant      2.8 -  29.2 ng/mL                            Post Menopausal   1.8 -  20.3 ng/mL                      . 17-OH-Progesterone, LC/MS/MS 11/07/2014 30   Final   Comment:   Adult Female Reference Ranges for   17-Hydroxyprogesterone, LC/MS/MS:      Follicular Phase:       < or = 185 ng/dL    Luteal Phase:           < or = 285 ng/dL    Postmenopausal Phase:   < or = 45 ng/dL    Pregnancy:    First Trimester:  78-457 ng/dL    Second Trimester: 84-696 ng/dL    Third Trimester:  295-284 ng/dL   . Hgb A1c MFr Bld 11/07/2014 6.1  4.6 - 6.5 % Final   Glycemic Control Guidelines for People with Diabetes:Non Diabetic:  <6%Goal of Therapy: <7%Additional Action Suggested:  >8%   . Quantitative HCG 11/07/2014 0.21   Final   Non-Pregnant Females >29yr and <99yr=0.0-0.6(mIU/ml)Non-Pregnant Females >44yrs=0.0-3.1(mIU/ml)Non-Pregnant Females Post-Menopause0.1-11.6(mIU/ml)   HbA1c higher >> will advise her to pay more attention to diet, but no other intervention needed. Testosterone normal >> no need for Spironolactone. 17 HO Progesterone, and prolactin normal.   Pt had labs by PCP recently >> multiple vitamin deficiencies >> started on replacement >> if she is not  feeling better after this, then it is possible that her sxs are related to anxiety, but her PCOS appears well controlled for now, no other intervention needed for this.

## 2014-11-07 NOTE — Patient Instructions (Signed)
Please discuss with Dr. Yetta BarreJones about starting an anxiety medication - possibly Xanax.  Please stop at the lab.  Please come back for a follow-up appointment in 6 months.  I will let you know about the results of your tests and if we can start Spironolactone.  Please look at the following website: [email or web address]  Polycystic Ovarian Syndrome Polycystic ovarian syndrome (PCOS) is a common hormonal disorder among women of reproductive age. Most women with PCOS grow many small cysts on their ovaries. PCOS can cause problems with your periods and make it difficult to get pregnant. It can also cause an increased risk of miscarriage with pregnancy. If left untreated, PCOS can lead to serious health problems, such as diabetes and heart disease. CAUSES The cause of PCOS is not fully understood, but genetics may be a factor. SIGNS AND SYMPTOMS   Infrequent or no menstrual periods.   Inability to get pregnant (infertility) because of not ovulating.   Increased growth of hair on the face, chest, stomach, back, thumbs, thighs, or toes.   Acne, oily skin, or dandruff.   Pelvic pain.   Weight gain or obesity, usually carrying extra weight around the waist.   Type 2 diabetes.   High cholesterol.   High blood pressure.   Female-pattern baldness or thinning hair.   Patches of thickened and dark brown or black skin on the neck, arms, breasts, or thighs.   Tiny excess flaps of skin (skin tags) in the armpits or neck area.   Excessive snoring and having breathing stop at times while asleep (sleep apnea).   Deepening of the voice.   Gestational diabetes when pregnant.  DIAGNOSIS  There is no single test to diagnose PCOS.   Your health care provider will:   Take a medical history.   Perform a pelvic exam.   Have ultrasonography done.   Check your female and female hormone levels.   Measure glucose or sugar levels in the blood.   Do other blood tests.   If you  are producing too many female hormones, your health care provider will make sure it is from PCOS. At the physical exam, your health care provider will want to evaluate the areas of increased hair growth. Try to allow natural hair growth for a few days before the visit.   During a pelvic exam, the ovaries may be enlarged or swollen because of the increased number of small cysts. This can be seen more easily by using vaginal ultrasonography or screening to examine the ovaries and lining of the uterus (endometrium) for cysts. The uterine lining may become thicker if you have not been having a regular period.  TREATMENT  Because there is no cure for PCOS, it needs to be managed to prevent problems. Treatments are based on your symptoms. Treatment is also based on whether you want to have a baby or whether you need contraception.  Treatment may include:   Progesterone hormone to start a menstrual period.   Birth control pills to make you have regular menstrual periods.   Medicines to make you ovulate, if you want to get pregnant.   Medicines to control your insulin.   Medicine to control your blood pressure.   Medicine and diet to control your high cholesterol and triglycerides in your blood.  Medicine to reduce excessive hair growth.  Surgery, making small holes in the ovary, to decrease the amount of female hormone production. This is done through a long, lighted tube (laparoscope) placed into the pelvis  through a tiny incision in the lower abdomen.  HOME CARE INSTRUCTIONS  Only take over-the-counter or prescription medicine as directed by your health care provider.  Pay attention to the foods you eat and your activity levels. This can help reduce the effects of PCOS.  Keep your weight under control.  Eat foods that are low in carbohydrate and high in fiber.  Exercise regularly. SEEK MEDICAL CARE IF:  Your symptoms do not get better with medicine.  You have new  symptoms. Document Released: 12/19/2004 Document Revised: 06/15/2013 Document Reviewed: 02/10/2013 [facility] Patient Information 2015 [place], [place]. This information is not intended to replace advice given to you by your health care provider. Make sure you discuss any questions you have with your health care provider.

## 2014-11-07 NOTE — Telephone Encounter (Signed)
Patient just left the Endocrinologist that Dr. Yetta BarreJones referred her too and the Endocrinologist said the patient needed a prescription but she could not write it. It had to come from the patient's PCP.  The Endocrinologist put a note to Dr. Yetta BarreJones in the patient's Mychart account.  Patient would like to know if she has to come in for this prescription or if Dr. Yetta BarreJones is sending the prescription to her pharmacist.

## 2014-11-07 NOTE — Telephone Encounter (Signed)
i am not comfortable prescribing xanax for her

## 2014-11-08 ENCOUNTER — Other Ambulatory Visit: Payer: Self-pay | Admitting: Internal Medicine

## 2014-11-08 ENCOUNTER — Encounter: Payer: Self-pay | Admitting: Internal Medicine

## 2014-11-08 DIAGNOSIS — E559 Vitamin D deficiency, unspecified: Secondary | ICD-10-CM

## 2014-11-08 DIAGNOSIS — D519 Vitamin B12 deficiency anemia, unspecified: Secondary | ICD-10-CM

## 2014-11-08 DIAGNOSIS — D509 Iron deficiency anemia, unspecified: Secondary | ICD-10-CM

## 2014-11-08 LAB — TESTOSTERONE, FREE, TOTAL, SHBG
Sex Hormone Binding: 73 nmol/L (ref 17–124)
TESTOSTERONE-% FREE: 1 % (ref 0.4–2.4)
Testosterone, Free: 3.1 pg/mL (ref 0.6–6.8)
Testosterone: 30 ng/dL (ref 10–70)

## 2014-11-08 LAB — LUTEINIZING HORMONE: LH: 8.03 m[IU]/mL

## 2014-11-08 LAB — FOLLICLE STIMULATING HORMONE: FSH: 4.8 m[IU]/mL

## 2014-11-08 LAB — PROLACTIN: PROLACTIN: 2.7 ng/mL

## 2014-11-08 MED ORDER — CYANOCOBALAMIN 500 MCG/0.1ML NA SOLN: 0.1000 mL | NASAL | Status: DC

## 2014-11-08 MED ORDER — FERRALET 90 90-1 MG PO TABS: 1.0000 | ORAL_TABLET | Freq: Every day | ORAL | Status: DC

## 2014-11-08 MED ORDER — CHOLECALCIFEROL 1.25 MG (50000 UT) PO TABS: 1.0000 | ORAL_TABLET | ORAL | Status: DC

## 2014-11-08 NOTE — Telephone Encounter (Signed)
Gave patient provider response.  Told patient to get back in touch with Dr. Elvera LennoxGherghe on prescribing something in her own capabilities for treatment.

## 2014-11-10 ENCOUNTER — Encounter: Payer: Self-pay | Admitting: Internal Medicine

## 2014-11-10 LAB — VITAMIN B1: VITAMIN B1 (THIAMINE): 10 nmol/L (ref 8–30)

## 2014-11-10 LAB — 17-HYDROXYPROGESTERONE: 17-OH-Progesterone, LC/MS/MS: 30 ng/dL

## 2014-11-10 LAB — ZINC: Zinc: 60 ug/dL (ref 60–130)

## 2014-11-11 LAB — VITAMIN B6: Vitamin B6: 12.5 ng/mL (ref 2.1–21.7)

## 2014-11-12 ENCOUNTER — Other Ambulatory Visit: Payer: Self-pay | Admitting: Internal Medicine

## 2014-11-12 ENCOUNTER — Encounter: Payer: Self-pay | Admitting: Internal Medicine

## 2014-11-12 DIAGNOSIS — Z9884 Bariatric surgery status: Secondary | ICD-10-CM

## 2014-11-12 MED ORDER — VITAMIN B-1 100 MG PO TABS: 100.0000 mg | ORAL_TABLET | Freq: Every day | ORAL | Status: DC

## 2014-11-12 MED ORDER — ZINC GLUCONATE 50 MG PO TABS: 50.0000 mg | ORAL_TABLET | Freq: Every day | ORAL | Status: DC

## 2014-11-13 LAB — ESTRADIOL, FREE
ESTRADIOL FREE: 0.65 pg/mL
ESTRADIOL: 48 pg/mL

## 2014-11-17 ENCOUNTER — Other Ambulatory Visit: Payer: Self-pay | Admitting: Internal Medicine

## 2014-11-17 DIAGNOSIS — E282 Polycystic ovarian syndrome: Secondary | ICD-10-CM

## 2014-11-17 DIAGNOSIS — M25559 Pain in unspecified hip: Secondary | ICD-10-CM

## 2014-11-23 MED ORDER — HYDROCODONE-ACETAMINOPHEN 5-325 MG PO TABS: ORAL_TABLET | ORAL | Status: DC

## 2014-11-24 ENCOUNTER — Other Ambulatory Visit: Payer: Self-pay

## 2014-11-24 DIAGNOSIS — M25559 Pain in unspecified hip: Secondary | ICD-10-CM

## 2014-11-24 DIAGNOSIS — E282 Polycystic ovarian syndrome: Secondary | ICD-10-CM

## 2014-11-24 MED ORDER — HYDROCODONE-ACETAMINOPHEN 5-325 MG PO TABS: ORAL_TABLET | ORAL | Status: DC

## 2014-12-08 ENCOUNTER — Other Ambulatory Visit: Payer: Self-pay | Admitting: Internal Medicine

## 2015-01-09 ENCOUNTER — Other Ambulatory Visit: Payer: Self-pay | Admitting: Internal Medicine

## 2015-01-09 DIAGNOSIS — E282 Polycystic ovarian syndrome: Secondary | ICD-10-CM

## 2015-01-09 DIAGNOSIS — M25559 Pain in unspecified hip: Secondary | ICD-10-CM

## 2015-01-09 MED ORDER — HYDROCODONE-ACETAMINOPHEN 5-325 MG PO TABS: ORAL_TABLET | ORAL | Status: DC

## 2015-02-09 ENCOUNTER — Telehealth: Payer: Self-pay | Admitting: Internal Medicine

## 2015-02-09 MED ORDER — GLUCOSE BLOOD VI STRP: ORAL_STRIP | Status: DC

## 2015-02-09 MED ORDER — ONETOUCH LANCETS MISC: Status: DC

## 2015-02-09 NOTE — Telephone Encounter (Signed)
Patient is requesting a refill of ONE TOUCH LANCETS MISC [[id number]][[id number]] for verio IQ brand reader. She is totally out. Verified pharmacy is [facility] on [address].   If you are unable to send this without dr. Yetta BarreJones, please call the patient so she will know today.

## 2015-02-09 NOTE — Telephone Encounter (Signed)
Patient states she did not need lancets she was needing verio test strips to be sent in.

## 2015-02-09 NOTE — Addendum Note (Signed)
Addended by: Rock NephewARCHIE, Dorrie Cocuzza T on: 02/09/2015 03:45 PM   Modules accepted: Orders

## 2015-02-09 NOTE — Telephone Encounter (Signed)
Done

## 2015-02-09 NOTE — Telephone Encounter (Signed)
[facility]

## 2015-02-26 ENCOUNTER — Other Ambulatory Visit: Payer: Self-pay | Admitting: Internal Medicine

## 2015-02-26 DIAGNOSIS — E282 Polycystic ovarian syndrome: Secondary | ICD-10-CM

## 2015-02-26 DIAGNOSIS — M25559 Pain in unspecified hip: Secondary | ICD-10-CM

## 2015-02-26 MED ORDER — HYDROCODONE-ACETAMINOPHEN 5-325 MG PO TABS: ORAL_TABLET | ORAL | Status: DC

## 2015-02-26 NOTE — Addendum Note (Signed)
Addended by: Etta Grandchild on: 02/26/2015 01:47 PM   Modules accepted: Orders

## 2015-04-11 ENCOUNTER — Other Ambulatory Visit: Payer: Self-pay | Admitting: Internal Medicine

## 2015-04-13 ENCOUNTER — Other Ambulatory Visit: Payer: Self-pay | Admitting: Internal Medicine

## 2015-04-13 DIAGNOSIS — M25559 Pain in unspecified hip: Secondary | ICD-10-CM

## 2015-04-13 DIAGNOSIS — E282 Polycystic ovarian syndrome: Secondary | ICD-10-CM

## 2015-04-13 NOTE — Telephone Encounter (Signed)
Last seen 10/2014, must be seen every three months, pt notified via mychart.

## 2015-04-13 NOTE — Telephone Encounter (Signed)
Please advise 

## 2015-04-17 ENCOUNTER — Ambulatory Visit (INDEPENDENT_AMBULATORY_CARE_PROVIDER_SITE_OTHER): Payer: 59 | Admitting: Geriatric Medicine

## 2015-04-17 DIAGNOSIS — D519 Vitamin B12 deficiency anemia, unspecified: Secondary | ICD-10-CM

## 2015-04-17 MED ORDER — CYANOCOBALAMIN 1000 MCG/ML IJ SOLN
1000.0000 ug | Freq: Once | INTRAMUSCULAR | Status: AC
  Administered 2015-04-17: 1000 ug via INTRAMUSCULAR

## 2015-04-18 ENCOUNTER — Ambulatory Visit (INDEPENDENT_AMBULATORY_CARE_PROVIDER_SITE_OTHER): Payer: 59 | Admitting: Internal Medicine

## 2015-04-18 ENCOUNTER — Encounter: Payer: Self-pay | Admitting: Internal Medicine

## 2015-04-18 ENCOUNTER — Other Ambulatory Visit (INDEPENDENT_AMBULATORY_CARE_PROVIDER_SITE_OTHER): Payer: 59

## 2015-04-18 VITALS — BP 154/92 | HR 90 | Temp 98.5°F | Resp 16 | Ht 69.0 in | Wt 219.0 lb

## 2015-04-18 DIAGNOSIS — E282 Polycystic ovarian syndrome: Secondary | ICD-10-CM

## 2015-04-18 DIAGNOSIS — Z9884 Bariatric surgery status: Secondary | ICD-10-CM

## 2015-04-18 DIAGNOSIS — D529 Folate deficiency anemia, unspecified: Secondary | ICD-10-CM

## 2015-04-18 DIAGNOSIS — M25559 Pain in unspecified hip: Secondary | ICD-10-CM

## 2015-04-18 DIAGNOSIS — D509 Iron deficiency anemia, unspecified: Secondary | ICD-10-CM

## 2015-04-18 DIAGNOSIS — R739 Hyperglycemia, unspecified: Secondary | ICD-10-CM

## 2015-04-18 DIAGNOSIS — D519 Vitamin B12 deficiency anemia, unspecified: Secondary | ICD-10-CM | POA: Diagnosis not present

## 2015-04-18 LAB — CBC WITH DIFFERENTIAL/PLATELET
Basophils Absolute: 0 10*3/uL (ref 0.0–0.1)
Basophils Relative: 0.3 % (ref 0.0–3.0)
EOS PCT: 1.2 % (ref 0.0–5.0)
Eosinophils Absolute: 0.1 10*3/uL (ref 0.0–0.7)
LYMPHS ABS: 3.4 10*3/uL (ref 0.7–4.0)
Lymphocytes Relative: 51.6 % — ABNORMAL HIGH (ref 12.0–46.0)
MCHC: 31.9 g/dL (ref 30.0–36.0)
MCV: 76.7 fl — ABNORMAL LOW (ref 78.0–100.0)
MONOS PCT: 7 % (ref 3.0–12.0)
Monocytes Absolute: 0.5 10*3/uL (ref 0.1–1.0)
NEUTROS ABS: 2.6 10*3/uL (ref 1.4–7.7)
Neutrophils Relative %: 39.9 % — ABNORMAL LOW (ref 43.0–77.0)
Platelets: 230 10*3/uL (ref 150.0–400.0)
RBC: 3.61 Mil/uL — ABNORMAL LOW (ref 3.87–5.11)
RDW: 18.9 % — AB (ref 11.5–15.5)
WBC: 6.5 10*3/uL (ref 4.0–10.5)

## 2015-04-18 MED ORDER — HYDROCODONE-ACETAMINOPHEN 5-325 MG PO TABS: ORAL_TABLET | ORAL | Status: DC

## 2015-04-18 MED ORDER — GLUCOSE BLOOD VI STRP: ORAL_STRIP | Status: DC

## 2015-04-18 NOTE — Progress Notes (Signed)
Subjective:  Patient ID: Anna Nguyen, female    DOB: 07/22/1986  Age: 29 y.o. MRN: [id number]  CC: Anemia   HPI Anna Nguyen presents for follow-up on anemia and chronic, intermittent episodes of pelvic pain. She requests a refill on Norco. She complains of intermittent episodes of bilateral lower pelvic pain that radiates into her back. Sometimes the pain is associated with her menstrual cycles and sometimes it isn't. She has been trying Tylenol but says it doesn't adequately control her pain.  Outpatient Prescriptions Prior to Visit  Medication Sig Dispense Refill  . Cholecalciferol 50000 UNITS TABS Take 1 tablet by mouth once a week. 12 tablet 3  . cyanocobalamin (,VITAMIN B-12,) 1000 MCG/ML injection Inject 1 mL (1,000 mcg total) into the muscle every 30 (thirty) days. 10 mL 1  . folic acid (FOLVITE) 1 MG tablet Take 1 tablet (1 mg total) by mouth daily. 90 tablet 3  . Multiple Vitamin (MULTI VITAMIN DAILY PO) Take by mouth 1 day or 1 dose.    Marland Kitchen NUVARING 0.12-0.015 MG/24HR vaginal ring INSERT 1 RING VAGINALLY FOR 3 WEEKS AS DIRECTED 1 each 5  . ONE TOUCH LANCETS MISC Check sugars 5x a day 200 each 11  . SYRINGE-NEEDLE, DISP, 3 ML (BD ECLIPSE SYRINGE) 25G X 5/8" 3 ML MISC Use once monthly with B12 injection 12 each 0  . thiamine (VITAMIN B-1) 100 MG tablet Take 1 tablet (100 mg total) by mouth daily. 90 tablet 3  . zinc gluconate 50 MG tablet Take 1 tablet (50 mg total) by mouth daily. 90 tablet 3  . glucose blood test strip Test up to three times daily 30 each prn  . HYDROcodone-acetaminophen (NORCO/VICODIN) 5-325 MG per tablet TAKE 1 TABLET BY MOUTH EVERY 6 HOURS AS NEEDED FOR PAIN 50 tablet 0  . Cyanocobalamin 500 MCG/0.1ML SOLN Place 0.1 mLs (500 mcg total) into the nose once a week. (Patient not taking: Reported on 04/18/2015) 2.3 mL 11  . Fe Cbn-Fe Gluc-FA-B12-C-DSS (FERRALET 90) 90-1 MG TABS Take 1 tablet by mouth daily. (Patient not taking: Reported on 04/18/2015) 90  each 3   No facility-administered medications prior to visit.    ROS Review of Systems  Constitutional: Negative.  Negative for fever, chills, diaphoresis, appetite change and fatigue.  HENT: Negative.   Eyes: Negative.   Respiratory: Negative.  Negative for cough, choking, chest tightness, shortness of breath and stridor.   Cardiovascular: Negative.  Negative for chest pain, palpitations and leg swelling.  Gastrointestinal: Negative.   Endocrine: Negative.   Genitourinary: Positive for pelvic pain. Negative for dysuria, urgency, frequency, hematuria, flank pain, decreased urine volume, vaginal bleeding, vaginal discharge, enuresis, difficulty urinating, genital sores, vaginal pain, menstrual problem and dyspareunia.  Musculoskeletal: Positive for back pain. Negative for myalgias, joint swelling and arthralgias.  Skin: Negative.  Negative for rash.  Allergic/Immunologic: Negative.   Neurological: Negative.  Negative for dizziness, syncope, speech difficulty, light-headedness, numbness and headaches.  Hematological: Negative.  Negative for adenopathy. Does not bruise/bleed easily.  Psychiatric/Behavioral: Negative.     Objective:  BP 154/92 mmHg  Pulse 90  Temp(Src) 98.5 F (36.9 C) (Oral)  Resp 16  Ht 5\' 9"  (1.753 m)  Wt 219 lb (99.338 kg)  BMI 32.33 kg/m2  SpO2 98%  BP Readings from Last 3 Encounters:  04/18/15 154/92  11/07/14 122/86  10/25/14 142/90    Wt Readings from Last 3 Encounters:  04/18/15 219 lb (99.338 kg)  11/07/14 221 lb 12.8 oz (100.608  kg)  10/25/14 220 lb (99.791 kg)    Physical Exam  Constitutional: She is oriented to person, place, and time. No distress.  HENT:  Mouth/Throat: Oropharynx is clear and moist. No oropharyngeal exudate.  Eyes: Conjunctivae are normal. Right eye exhibits no discharge. Left eye exhibits no discharge. No scleral icterus.  Neck: Normal range of motion. Neck supple. No JVD present. No tracheal deviation present. No  thyromegaly present.  Cardiovascular: Normal rate, regular rhythm, normal heart sounds and intact distal pulses.  Exam reveals no gallop and no friction rub.   No murmur heard. Pulmonary/Chest: Effort normal and breath sounds normal. No stridor. No respiratory distress. She has no wheezes. She has no rales. She exhibits no tenderness.  Abdominal: Soft. Bowel sounds are normal. She exhibits no distension and no mass. There is no tenderness. There is no rebound and no guarding.  Musculoskeletal: Normal range of motion. She exhibits no edema or tenderness.  Lymphadenopathy:    She has no cervical adenopathy.  Neurological: She is oriented to person, place, and time.  Skin: Skin is warm and dry. No rash noted. She is not diaphoretic. No erythema. No pallor.  Psychiatric: She has a normal mood and affect. Her behavior is normal. Judgment and thought content normal.  Vitals reviewed.   Lab Results  Component Value Date   WBC 6.5 04/18/2015   HGB 8.8 Repeated and verified X2.* 04/18/2015   HCT 27.6 Repeated and verified X2.* 04/18/2015   PLT 230.0 04/18/2015   GLUCOSE 99 11/07/2014   CHOL 149 07/13/2012   TRIG 74.0 07/13/2012   HDL 75.90 07/13/2012   LDLCALC 58 07/13/2012   ALT 21 07/13/2013   AST 20 07/13/2013   NA 137 11/07/2014   K 3.8 11/07/2014   CL 105 11/07/2014   CREATININE 0.75 11/07/2014   BUN 10 11/07/2014   CO2 27 11/07/2014   TSH 0.52 11/07/2014   HGBA1C 6.1 11/07/2014   MICROALBUR 1.13 10/03/2008    US Transvaginal Non-ob  11/01/2014   CLINICAL DATA:  Polycystic ovarian syndrome.  EXAM: TRANSABDOMINAL AND TRANSVAGINAL ULTRASOUND OF PELVIS  TECHNIQUE: Both transabdominal and transvaginal ultrasound examinations of the pelvis were performed. Transabdominal technique was performed for global imaging of the pelvis including uterus, ovaries, adnexal regions, and pelvic cul-de-sac. It was necessary to proceed with endovaginal exam following the transabdominal exam to visualize  the endometrium.  COMPARISON:  Ultrasound 03/08/2013  FINDINGS: Uterus  Measurements: 8.1 x 4.3 x 4.8 cm. No fibroids or other mass visualized.  Endometrium  Thickness: 10 mm in thickness.  No focal abnormality visualized.  Right ovary  Measurements: 4.6 x 1.8 x 3.3 cm. Numerous small peripheral follicles. No adnexal masses.  Left ovary  Measurements: 3.4 x 2.3 x 2.6 cm. Numerous small peripheral follicles. No adnexal masses.  Other findings  Trace free fluid in the pelvis.  IMPRESSION: Mildly prominent ovaries bilaterally with small peripheral follicles. Findings compatible with given history of polycystic ovarian syndrome.   Electronically Signed   By: Charlett Nose M.D.   On: 11/01/2014 17:14   US Pelvis Complete  11/01/2014   CLINICAL DATA:  Polycystic ovarian syndrome.  EXAM: TRANSABDOMINAL AND TRANSVAGINAL ULTRASOUND OF PELVIS  TECHNIQUE: Both transabdominal and transvaginal ultrasound examinations of the pelvis were performed. Transabdominal technique was performed for global imaging of the pelvis including uterus, ovaries, adnexal regions, and pelvic cul-de-sac. It was necessary to proceed with endovaginal exam following the transabdominal exam to visualize the endometrium.  COMPARISON:  Ultrasound 03/08/2013  FINDINGS: Uterus  Measurements: 8.1 x 4.3 x 4.8 cm. No fibroids or other mass visualized.  Endometrium  Thickness: 10 mm in thickness.  No focal abnormality visualized.  Right ovary  Measurements: 4.6 x 1.8 x 3.3 cm. Numerous small peripheral follicles. No adnexal masses.  Left ovary  Measurements: 3.4 x 2.3 x 2.6 cm. Numerous small peripheral follicles. No adnexal masses.  Other findings  Trace free fluid in the pelvis.  IMPRESSION: Mildly prominent ovaries bilaterally with small peripheral follicles. Findings compatible with given history of polycystic ovarian syndrome.   Electronically Signed   By: Charlett Nose M.D.   On: 11/01/2014 17:14    Assessment & Plan:   Elfreda was seen today for  anemia.  Diagnoses and all orders for this visit:  Pain in joint, pelvic region and thigh, unspecified laterality- she will continue Norco as needed for pain. I have asked her to have a pelvic ultrasound performed to see if she has any ovarian cysts, ovarian tumors or lesions in her uterus. -     HYDROcodone-acetaminophen (NORCO/VICODIN) 5-325 MG per tablet; TAKE 1 TABLET BY MOUTH EVERY 6 HOURS AS NEEDED FOR PAIN -     US Pelvis Complete; Future  PCOS (polycystic ovarian syndrome)- she will continue Norco for pain, will recheck her pelvic ultrasound to see if she has any ovarian cyst. -     HYDROcodone-acetaminophen (NORCO/VICODIN) 5-325 MG per tablet; TAKE 1 TABLET BY MOUTH EVERY 6 HOURS AS NEEDED FOR PAIN -     glucose blood test strip; Test up to three times daily -     US Pelvis Complete; Future  B12 deficiency anemia -     CBC with Differential/Platelet; Future -     Vitamin B12; Future  Folate deficiency anemia -     CBC with Differential/Platelet; Future -     Folate; Future  Iron deficiency anemia- her anemia has worsened, I have asked her to return to the lab for additional testing to see if her vitamin deficiencies have worsened. -     CBC with Differential/Platelet; Future -     IBC panel; Future -     Ferritin; Future  Hyperglycemia- she has prediabetes, we'll continue to work on her lifestyle modifications. -     Hemoglobin A1c; Future -     Basic metabolic panel; Future  Gastric bypass status for obesity- I will recheck her vitamin levels to see if she has developed a vitamin deficiency, will treat if indicated. -     Vitamin B6; Future -     Vitamin B1; Future  I am having Ms. Braxton maintain her cyanocobalamin, SYRINGE-NEEDLE (DISP) 3 ML, folic acid, Multiple Vitamin (MULTI VITAMIN DAILY PO), FERRALET 90, Cholecalciferol, Cyanocobalamin, zinc gluconate, thiamine, NUVARING, ONE TOUCH LANCETS, HYDROcodone-acetaminophen, and glucose blood.  Meds ordered this  encounter  Medications  . HYDROcodone-acetaminophen (NORCO/VICODIN) 5-325 MG per tablet    Sig: TAKE 1 TABLET BY MOUTH EVERY 6 HOURS AS NEEDED FOR PAIN    Dispense:  50 tablet    Refill:  0  . glucose blood test strip    Sig: Test up to three times daily    Dispense:  100 each    Refill:  11    Onet[facility] Verio     Follow-up: Return in about 4 weeks (around 05/16/2015).  Sanda Linger, MD

## 2015-04-18 NOTE — Patient Instructions (Signed)
Iron Deficiency Anemia Anemia is a condition in which there are less red blood cells or hemoglobin in the blood than normal. Hemoglobin is the part of red blood cells that carries oxygen. Iron deficiency anemia is anemia caused by too little iron. It is the most common type of anemia. It may leave you tired and short of breath. CAUSES   Lack of iron in the diet.  Poor absorption of iron, as seen with intestinal disorders.  Intestinal bleeding.  Heavy periods. SIGNS AND SYMPTOMS  Mild anemia may not be noticeable. Symptoms may include:  Fatigue.  Headache.  Pale skin.  Weakness.  Tiredness.  Shortness of breath.  Dizziness.  Cold hands and feet.  Fast or irregular heartbeat. DIAGNOSIS  Diagnosis requires a thorough evaluation and physical exam by your health care provider. Blood tests are generally used to confirm iron deficiency anemia. Additional tests may be done to find the underlying cause of your anemia. These may include:  Testing for blood in the stool (fecal occult blood test).  A procedure to see inside the colon and rectum (colonoscopy).  A procedure to see inside the esophagus and stomach (endoscopy). TREATMENT  Iron deficiency anemia is treated by correcting the cause of the deficiency. Treatment may involve:  Adding iron-rich foods to your diet.  Taking iron supplements. Pregnant or breastfeeding women need to take extra iron because their normal diet usually does not provide the required amount.  Taking vitamins. Vitamin C improves the absorption of iron. Your health care provider may recommend that you take your iron tablets with a glass of orange juice or vitamin C supplement.  Medicines to make heavy menstrual flow lighter.  Surgery. HOME CARE INSTRUCTIONS   Take iron as directed by your health care provider.  If you cannot tolerate taking iron supplements by mouth, talk to your health care provider about taking them through a vein  (intravenously) or an injection into a muscle.  For the best iron absorption, iron supplements should be taken on an empty stomach. If you cannot tolerate them on an empty stomach, you may need to take them with food.  Do not drink milk or take antacids at the same time as your iron supplements. Milk and antacids may interfere with the absorption of iron.  Iron supplements can cause constipation. Make sure to include fiber in your diet to prevent constipation. A stool softener may also be recommended.  Take vitamins as directed by your health care provider.  Eat a diet rich in iron. Foods high in iron include liver, lean beef, whole-grain bread, eggs, dried fruit, and dark green leafy vegetables. SEEK IMMEDIATE MEDICAL CARE IF:   You faint. If this happens, do not drive. Call your local emergency services (911 in [place] if no other help is available.  You have chest pain.  You feel nauseous or vomit.  You have severe or increased shortness of breath with activity.  You feel weak.  You have a rapid heartbeat.  You have unexplained sweating.  You become light-headed when getting up from a chair or bed. MAKE SURE YOU:   Understand these instructions.  Will watch your condition.  Will get help right away if you are not doing well or get worse. Document Released: 08/22/2000 Document Revised: 08/30/2013 Document Reviewed: 05/02/2013 [employer] Patient Information 2015 [employer]. This information is not intended to replace advice given to you by your health care provider. Make sure you discuss any questions you have with your health care provider.  

## 2015-04-18 NOTE — Progress Notes (Signed)
Pre visit review using our clinic review tool, if applicable. No additional management support is needed unless otherwise documented below in the visit note. 

## 2015-04-19 ENCOUNTER — Encounter: Payer: Self-pay | Admitting: Internal Medicine

## 2015-04-22 ENCOUNTER — Encounter: Payer: Self-pay | Admitting: Internal Medicine

## 2015-04-23 ENCOUNTER — Other Ambulatory Visit: Payer: Self-pay | Admitting: Internal Medicine

## 2015-04-23 DIAGNOSIS — E282 Polycystic ovarian syndrome: Secondary | ICD-10-CM

## 2015-04-23 DIAGNOSIS — M25559 Pain in unspecified hip: Secondary | ICD-10-CM

## 2015-05-23 ENCOUNTER — Other Ambulatory Visit: Payer: Self-pay | Admitting: Internal Medicine

## 2015-05-23 DIAGNOSIS — M25559 Pain in unspecified hip: Secondary | ICD-10-CM

## 2015-05-23 DIAGNOSIS — E282 Polycystic ovarian syndrome: Secondary | ICD-10-CM

## 2015-05-23 MED ORDER — HYDROCODONE-ACETAMINOPHEN 5-325 MG PO TABS: ORAL_TABLET | ORAL | Status: DC

## 2015-05-24 ENCOUNTER — Ambulatory Visit: Payer: 59

## 2015-06-25 ENCOUNTER — Other Ambulatory Visit: Payer: Self-pay | Admitting: Internal Medicine

## 2015-06-25 DIAGNOSIS — M25559 Pain in unspecified hip: Secondary | ICD-10-CM

## 2015-06-25 DIAGNOSIS — E282 Polycystic ovarian syndrome: Secondary | ICD-10-CM

## 2015-06-25 MED ORDER — HYDROCODONE-ACETAMINOPHEN 5-325 MG PO TABS: ORAL_TABLET | ORAL | Status: DC

## 2015-08-14 ENCOUNTER — Other Ambulatory Visit: Payer: Self-pay | Admitting: Internal Medicine

## 2015-08-14 DIAGNOSIS — E282 Polycystic ovarian syndrome: Secondary | ICD-10-CM

## 2015-08-14 DIAGNOSIS — M25559 Pain in unspecified hip: Secondary | ICD-10-CM

## 2015-08-14 MED ORDER — HYDROCODONE-ACETAMINOPHEN 5-325 MG PO TABS: ORAL_TABLET | ORAL | Status: DC

## 2015-08-14 NOTE — Addendum Note (Signed)
Addended by: Etta GrandchildJONES, Jeanne Terrance L on: 08/14/2015 10:10 AM   Modules accepted: Orders

## 2015-09-09 NOTE — L&D Delivery Note (Signed)
Pt was admitted two days ago for induction. She had cytotec then pitocin. She progressed slowly to 6cm then rapidly completed the first stage.She pushed for 15 min.She had a SVD of one live viable infant over a 1st degree midline tear in the ROA position. Placenta- S/I. Baby to NBN. Heavy lochia txd with methergine. EBL-400cc. Tear closed with 3-0 chromic.

## 2015-10-02 ENCOUNTER — Telehealth: Payer: Self-pay | Admitting: Internal Medicine

## 2015-10-02 ENCOUNTER — Telehealth: Payer: Self-pay

## 2015-10-02 DIAGNOSIS — M25559 Pain in unspecified hip: Secondary | ICD-10-CM

## 2015-10-02 DIAGNOSIS — E282 Polycystic ovarian syndrome: Secondary | ICD-10-CM

## 2015-10-02 MED ORDER — HYDROCODONE-ACETAMINOPHEN 5-325 MG PO TABS: ORAL_TABLET | ORAL | Status: DC

## 2015-10-02 NOTE — Telephone Encounter (Signed)
Medication printed signed and faxed 10/02/2015 

## 2015-10-02 NOTE — Telephone Encounter (Signed)
Done hardcopy to Heather  

## 2015-10-02 NOTE — Telephone Encounter (Signed)
[facility] just call stated that we can not fax over the control sub class II meds. Please check

## 2015-10-02 NOTE — Addendum Note (Signed)
Addended by: Corwin Levins on: 10/02/2015 01:05 PM   Modules accepted: Orders

## 2015-10-27 ENCOUNTER — Other Ambulatory Visit: Payer: Self-pay | Admitting: Internal Medicine

## 2015-11-21 ENCOUNTER — Other Ambulatory Visit: Payer: Self-pay | Admitting: Obstetrics and Gynecology

## 2015-11-21 ENCOUNTER — Ambulatory Visit: Payer: 59

## 2015-11-21 LAB — HM PAP SMEAR

## 2015-11-21 LAB — OB RESULTS CONSOLE GC/CHLAMYDIA
Chlamydia: NEGATIVE
Gonorrhea: NEGATIVE

## 2015-11-21 LAB — OB RESULTS CONSOLE ANTIBODY SCREEN: Antibody Screen: NEGATIVE

## 2015-11-21 LAB — OB RESULTS CONSOLE RPR: RPR: NONREACTIVE

## 2015-11-21 LAB — OB RESULTS CONSOLE ABO/RH: RH TYPE: POSITIVE

## 2015-11-21 LAB — OB RESULTS CONSOLE HEPATITIS B SURFACE ANTIGEN: Hepatitis B Surface Ag: NEGATIVE

## 2015-11-21 LAB — OB RESULTS CONSOLE HIV ANTIBODY (ROUTINE TESTING): HIV: NONREACTIVE

## 2015-11-21 LAB — OB RESULTS CONSOLE RUBELLA ANTIBODY, IGM: RUBELLA: IMMUNE

## 2015-11-22 LAB — CYTOLOGY - PAP

## 2016-01-10 ENCOUNTER — Encounter: Payer: Self-pay | Admitting: Hematology and Oncology

## 2016-01-16 ENCOUNTER — Encounter: Payer: Self-pay | Admitting: Hematology and Oncology

## 2016-01-16 ENCOUNTER — Telehealth: Payer: Self-pay | Admitting: Hematology and Oncology

## 2016-01-16 ENCOUNTER — Ambulatory Visit: Payer: 59

## 2016-01-16 ENCOUNTER — Ambulatory Visit (HOSPITAL_BASED_OUTPATIENT_CLINIC_OR_DEPARTMENT_OTHER): Payer: 59 | Admitting: Hematology and Oncology

## 2016-01-16 ENCOUNTER — Other Ambulatory Visit (HOSPITAL_BASED_OUTPATIENT_CLINIC_OR_DEPARTMENT_OTHER): Payer: 59

## 2016-01-16 VITALS — BP 110/61 | HR 111 | Temp 98.1°F | Resp 15 | Ht 69.0 in | Wt 209.5 lb

## 2016-01-16 DIAGNOSIS — D519 Vitamin B12 deficiency anemia, unspecified: Secondary | ICD-10-CM

## 2016-01-16 DIAGNOSIS — D509 Iron deficiency anemia, unspecified: Secondary | ICD-10-CM | POA: Diagnosis not present

## 2016-01-16 DIAGNOSIS — D518 Other vitamin B12 deficiency anemias: Secondary | ICD-10-CM

## 2016-01-16 DIAGNOSIS — Z980 Intestinal bypass and anastomosis status: Secondary | ICD-10-CM

## 2016-01-16 DIAGNOSIS — O99012 Anemia complicating pregnancy, second trimester: Secondary | ICD-10-CM | POA: Insufficient documentation

## 2016-01-16 DIAGNOSIS — Z9884 Bariatric surgery status: Secondary | ICD-10-CM

## 2016-01-16 DIAGNOSIS — R112 Nausea with vomiting, unspecified: Secondary | ICD-10-CM | POA: Diagnosis not present

## 2016-01-16 DIAGNOSIS — O219 Vomiting of pregnancy, unspecified: Secondary | ICD-10-CM | POA: Insufficient documentation

## 2016-01-16 LAB — CBC & DIFF AND RETIC
BASO%: 0.3 % (ref 0.0–2.0)
Basophils Absolute: 0 10*3/uL (ref 0.0–0.1)
EOS ABS: 0 10*3/uL (ref 0.0–0.5)
EOS%: 0.5 % (ref 0.0–7.0)
HCT: 25.7 % — ABNORMAL LOW (ref 34.8–46.6)
HEMOGLOBIN: 8.2 g/dL — AB (ref 11.6–15.9)
IMMATURE RETIC FRACT: 9.1 % (ref 1.60–10.00)
LYMPH#: 2.1 10*3/uL (ref 0.9–3.3)
LYMPH%: 26.2 % (ref 14.0–49.7)
MCH: 25 pg — ABNORMAL LOW (ref 25.1–34.0)
MCHC: 31.9 g/dL (ref 31.5–36.0)
MCV: 78.4 fL — AB (ref 79.5–101.0)
MONO#: 0.4 10*3/uL (ref 0.1–0.9)
MONO%: 5.1 % (ref 0.0–14.0)
NEUT%: 67.9 % (ref 38.4–76.8)
NEUTROS ABS: 5.3 10*3/uL (ref 1.5–6.5)
Platelets: 221 10*3/uL (ref 145–400)
RBC: 3.28 10*6/uL — AB (ref 3.70–5.45)
RDW: 18.9 % — AB (ref 11.2–14.5)
RETIC %: 2.36 % — AB (ref 0.70–2.10)
RETIC CT ABS: 77.41 10*3/uL (ref 33.70–90.70)
WBC: 7.8 10*3/uL (ref 3.9–10.3)

## 2016-01-16 LAB — FERRITIN: FERRITIN: 12 ng/mL (ref 9–269)

## 2016-01-16 MED ORDER — SODIUM CHLORIDE 0.9 % IV SOLN
Freq: Once | INTRAVENOUS | Status: AC
  Administered 2016-01-16: 14:00:00 via INTRAVENOUS

## 2016-01-16 MED ORDER — ONDANSETRON HCL 40 MG/20ML IJ SOLN
Freq: Once | INTRAMUSCULAR | Status: AC
  Administered 2016-01-16: 14:00:00 via INTRAVENOUS
  Filled 2016-01-16: qty 4

## 2016-01-16 NOTE — Assessment & Plan Note (Signed)
She has severe, uncontrolled nausea and vomiting which is likely related to her pregnancy. She appears dehydrated today with tachycardia. I recommend IV fluids and IV anti-emetics and she agreed to proceed

## 2016-01-16 NOTE — Assessment & Plan Note (Signed)
She had multiple mineral deficiency and is receiving replacement therapy

## 2016-01-16 NOTE — Assessment & Plan Note (Signed)
The most likely cause of her anemia is due to malabsorption syndrome. We discussed some of the risks, benefits, and alternatives of intravenous iron infusions. The patient is symptomatic from anemia and the iron level is critically low. She tolerated oral iron supplement poorly and desires to achieved higher levels of iron faster for adequate hematopoesis. Some of the side-effects to be expected including risks of infusion reactions, phlebitis, headaches, nausea and fatigue.  The patient is willing to proceed. Patient education material was dispensed.  Goal is to keep ferritin level greater than 50 I will proceed with IV iron 2 starting next week and recheck in 3 months

## 2016-01-16 NOTE — Progress Notes (Signed)
[facility] CONSULT [facility]  Patient Care Team: Etta Grandchildhomas L Jones, MD as PCP - General  CHIEF COMPLAINTS/PURPOSE OF CONSULTATION:  Severe iron deficiency anemia complicating pregnancy  HISTORY OF PRESENTING ILLNESS:  Anna Nguyen 30 y.o. female is here because of Anna Nguyen anemia complicating pregnancy. The patient has history of polycystic ovarian syndrome and morbid obesity status post laparoscopic gastric banding procedure. She was recently found to be pregnant, currently 19 weeks' pregnant. This is her first pregnancy. Expected due date is 06/12/2016. She was found to have multiple mineral deficiency including iron deficiency, B-12 deficiency and vitamin D deficiency since her gastric procedure in 2012. She used to receive vitamin B-12 injections frequently through her primary care provider but she has not received any injections over the past 6 months. The patient has heavy menstruation on the regular basis until her pregnancy. She was recommended to take oral iron supplements but she was not able to tolerate it due to severe nausea and vomiting. Her most recent blood work from her obstetrician showed low hemoglobin with evidence of iron deficiency and was hence referred here for further management She denies recent chest pain on exertion, shortness of breath on minimal exertion, pre-syncopal episodes, or palpitations. She has profound dizziness and weakness with recent poor oral intake due to uncontrolled nausea and vomiting. She had not noticed any recent bleeding such as epistaxis, hematuria or hematochezia The patient denies over the counter NSAID ingestion. She is not on antiplatelets agents.  She had no prior history or diagnosis of cancer. Her age appropriate screening programs are up-to-date. She denies any pica and eats a variety of diet. She had remote history of blood donation but has never received blood transfusion MEDICAL HISTORY:  Past Medical History   Diagnosis Date  . Anemia, iron deficiency   . Type II or unspecified type diabetes mellitus without mention of complication, not stated as uncontrolled   . HTN (hypertension)   . PCOS (polycystic ovarian syndrome)   . Pregnant state, incidental     SURGICAL HISTORY: Past Surgical History  Procedure Laterality Date  . Tonsillectomy    . Laparoscopic gastric banding      SOCIAL HISTORY: Social History   Social History  . Marital Status: Single    Spouse Name: N/A  . Number of Children: N/A  . Years of Education: N/A   Occupational History  . SALES     [employer]   Social History Main Topics  . Smoking status: Never Smoker   . Smokeless tobacco: Never Used  . Alcohol Use: No  . Drug Use: No  . Sexual Activity:    Partners: Male    Birth Control/ Protection: IUD   Other Topics Concern  . Not on file   Social History Narrative   Regular Exercise-yes   Caffeine use: occasionaly                FAMILY HISTORY: Family History  Problem Relation Age of Onset  . Arthritis Other   . Diabetes Other   . Hypertension Other   . Hyperlipidemia Other   . Prostate cancer Other   . Cancer Maternal Aunt     breast    ALLERGIES:  is allergic to aspirin; latex; and nsaids.  MEDICATIONS:  Current Outpatient Prescriptions  Medication Sig Dispense Refill  . ondansetron (ZOFRAN) 8 MG tablet Take 8 mg by mouth as needed.  6  . promethazine (PHENERGAN) 25 MG tablet Take 25 mg by mouth every 6 (  six) hours as needed.  4  . sertraline (ZOLOFT) 50 MG tablet Take 50 mg by mouth daily.  3   No current facility-administered medications for this visit.    REVIEW OF SYSTEMS:   Constitutional: Denies fevers, chills or abnormal night sweats Eyes: Denies blurriness of vision, double vision or watery eyes Ears, nose, mouth, throat, and face: Denies mucositis or sore throat Respiratory: Denies cough, dyspnea or wheezes Cardiovascular: Denies palpitation, chest discomfort or lower  extremity swelling Gastrointestinal:  Denies nausea, heartburn or change in bowel habits Skin: Denies abnormal skin rashes Lymphatics: Denies new lymphadenopathy or easy bruising Neurological:Denies numbness, tingling or new weaknesses Behavioral/Psych: Mood is stable, no new changes  All other systems were reviewed with the patient and are negative.  PHYSICAL EXAMINATION: ECOG PERFORMANCE STATUS: 1 - Symptomatic but completely ambulatory  Filed Vitals:   01/16/16 1203  BP: 110/61  Pulse: 111  Temp: 98.1 F (36.7 C)  Resp: 15   Filed Weights   01/16/16 1203  Weight: 209 lb 8 oz (95.029 kg)    GENERAL:alert, no distress and comfortable. She looks pale SKIN: skin color, texture, turgor are normal, no rashes or significant lesions EYES: normal, conjunctiva are pale and non-injected, sclera clear OROPHARYNX:no exudate, no erythema and lips, buccal mucosa, and tongue normal  NECK: supple, thyroid normal size, non-tender, without nodularity LYMPH:  no palpable lymphadenopathy in the cervical, axillary or inguinal LUNGS: clear to auscultation and percussion with normal breathing effort HEART: tachycardia, no murmurs and no lower extremity edema ABDOMEN:abdomen soft, non-tender and normal bowel sounds. Abdomen is distended consistent with pregnancy Musculoskeletal:no cyanosis of digits and no clubbing  PSYCH: alert & oriented x 3 with fluent speech NEURO: no focal motor/sensory deficits  ASSESSMENT & PLAN:  Iron deficiency anemia The most likely cause of her anemia is due to malabsorption syndrome. We discussed some of the risks, benefits, and alternatives of intravenous iron infusions. The patient is symptomatic from anemia and the iron level is critically low. She tolerated oral iron supplement poorly and desires to achieved higher levels of iron faster for adequate hematopoesis. Some of the side-effects to be expected including risks of infusion reactions, phlebitis, headaches,  nausea and fatigue.  The patient is willing to proceed. Patient education material was dispensed.  Goal is to keep ferritin level greater than 50 I will proceed with IV iron 2 starting next week and recheck in 3 months   B12 deficiency anemia She had history of severe vitamin B 12 deficiency secondary to malabsorption after bypass surgery. I would recheck her B-12 level today and replace as needed when she returns for IV iron next week  Gastric bypass status for obesity She had multiple mineral deficiency and is receiving replacement therapy  Severe nausea and vomiting She has severe, uncontrolled nausea and vomiting which is likely related to her pregnancy. She appears dehydrated today with tachycardia. I recommend IV fluids and IV anti-emetics and she agreed to proceed  Anemia affecting pregnancy in second trimester I discussed with the patient the rationale for IV iron. Recheck blood work today showed hemoglobin is greater than 8.2. She does not need blood transfusion today.    All questions were answered. The patient knows to call the clinic with any problems, questions or concerns. I spent 40 minutes counseling the patient face to face. The total time spent in the appointment was 60 minutes and more than 50% was on counseling.     [facility], Jayda White, MD 01/16/2016 2:35 PM

## 2016-01-16 NOTE — Patient Instructions (Signed)

## 2016-01-16 NOTE — Assessment & Plan Note (Signed)
She had history of severe vitamin B 12 deficiency secondary to malabsorption after bypass surgery. I would recheck her B-12 level today and replace as needed when she returns for IV iron next week

## 2016-01-16 NOTE — Progress Notes (Signed)
Pt received 1L NS over 2 hrs and zofran IV. Pt reported improvement of symptoms. VSS upon discharge.

## 2016-01-16 NOTE — Assessment & Plan Note (Signed)
I discussed with the patient the rationale for IV iron. Recheck blood work today showed hemoglobin is greater than 8.2. She does not need blood transfusion today.

## 2016-01-16 NOTE — Telephone Encounter (Signed)
Gave pt appt & avs °

## 2016-01-17 ENCOUNTER — Ambulatory Visit: Payer: 59 | Admitting: Hematology and Oncology

## 2016-01-17 LAB — VITAMIN B12: VITAMIN B 12: 113 pg/mL — AB (ref 211–946)

## 2016-01-23 ENCOUNTER — Ambulatory Visit (HOSPITAL_BASED_OUTPATIENT_CLINIC_OR_DEPARTMENT_OTHER): Payer: 59

## 2016-01-23 VITALS — BP 119/62 | HR 96 | Temp 98.5°F | Resp 18

## 2016-01-23 DIAGNOSIS — D509 Iron deficiency anemia, unspecified: Secondary | ICD-10-CM

## 2016-01-23 DIAGNOSIS — D518 Other vitamin B12 deficiency anemias: Secondary | ICD-10-CM

## 2016-01-23 DIAGNOSIS — D519 Vitamin B12 deficiency anemia, unspecified: Secondary | ICD-10-CM

## 2016-01-23 MED ORDER — SODIUM CHLORIDE 0.9 % IV SOLN
Freq: Once | INTRAVENOUS | Status: AC
  Administered 2016-01-23: 08:00:00 via INTRAVENOUS

## 2016-01-23 MED ORDER — CYANOCOBALAMIN 1000 MCG/ML IJ SOLN
INTRAMUSCULAR | Status: AC
  Filled 2016-01-23: qty 1

## 2016-01-23 MED ORDER — SODIUM CHLORIDE 0.9 % IV SOLN
Freq: Once | INTRAVENOUS | Status: AC
  Administered 2016-01-23: 08:00:00 via INTRAVENOUS
  Filled 2016-01-23: qty 4

## 2016-01-23 MED ORDER — FERUMOXYTOL INJECTION 510 MG/17 ML
510.0000 mg | Freq: Once | INTRAVENOUS | Status: AC
  Administered 2016-01-23: 510 mg via INTRAVENOUS
  Filled 2016-01-23: qty 17

## 2016-01-23 MED ORDER — SODIUM CHLORIDE 0.9 % IV SOLN: Freq: Once | INTRAVENOUS | Status: DC

## 2016-01-23 MED ORDER — CYANOCOBALAMIN 1000 MCG/ML IJ SOLN
1000.0000 ug | Freq: Once | INTRAMUSCULAR | Status: AC
  Administered 2016-01-23: 1000 ug via INTRAMUSCULAR

## 2016-01-23 NOTE — Patient Instructions (Signed)
Ferumoxytol injection What is this medicine? FERUMOXYTOL is an iron complex. Iron is used to make healthy red blood cells, which carry oxygen and nutrients throughout the body. This medicine is used to treat iron deficiency anemia in people with chronic kidney disease. This medicine may be used for other purposes; ask your health care provider or pharmacist if you have questions. What should I tell my health care provider before I take this medicine? They need to know if you have any of these conditions: -anemia not caused by low iron levels -high levels of iron in the blood -magnetic resonance imaging (MRI) test scheduled -an unusual or allergic reaction to iron, other medicines, foods, dyes, or preservatives -pregnant or trying to get pregnant -breast-feeding How should I use this medicine? This medicine is for injection into a vein. It is given by a health care professional in a hospital or clinic setting. Talk to your pediatrician regarding the use of this medicine in children. Special care may be needed. Overdosage: If you think you have taken too much of this medicine contact a poison control center or emergency room at once. NOTE: This medicine is only for you. Do not share this medicine with others. What if I miss a dose? It is important not to miss your dose. Call your doctor or health care professional if you are unable to keep an appointment. What may interact with this medicine? This medicine may interact with the following medications: -other iron products This list may not describe all possible interactions. Give your health care provider a list of all the medicines, herbs, non-prescription drugs, or dietary supplements you use. Also tell them if you smoke, drink alcohol, or use illegal drugs. Some items may interact with your medicine. What should I watch for while using this medicine? Visit your doctor or healthcare professional regularly. Tell your doctor or healthcare  professional if your symptoms do not start to get better or if they get worse. You may need blood work done while you are taking this medicine. You may need to follow a special diet. Talk to your doctor. Foods that contain iron include: whole grains/cereals, dried fruits, beans, or peas, leafy green vegetables, and organ meats (liver, kidney). What side effects may I notice from receiving this medicine? Side effects that you should report to your doctor or health care professional as soon as possible: -allergic reactions like skin rash, itching or hives, swelling of the face, lips, or tongue -breathing problems -changes in blood pressure -feeling faint or lightheaded, falls -fever or chills -flushing, sweating, or hot feelings -swelling of the ankles or feet Side effects that usually do not require medical attention (Report these to your doctor or health care professional if they continue or are bothersome.): -diarrhea -headache -nausea, vomiting -stomach pain This list may not describe all possible side effects. Call your doctor for medical advice about side effects. You may report side effects to FDA at 1-800-FDA-1088. Where should I keep my medicine? This drug is given in a hospital or clinic and will not be stored at home. NOTE: This sheet is a summary. It may not cover all possible information. If you have questions about this medicine, talk to your doctor, pharmacist, or health care provider.    2016, Elsevier/Gold Standard. (2012-04-09 15:23:36)  Dehydration, Adult Dehydration is a condition in which you do not have enough fluid or water in your body. It happens when you take in less fluid than you lose. Vital organs such as the kidneys,   brain, and heart cannot function without a proper amount of fluids. Any loss of fluids from the body can cause dehydration.  Dehydration can range from mild to severe. This condition should be treated right away to help prevent it from becoming  severe. CAUSES  This condition may be caused by:  Vomiting.  Diarrhea.  Excessive sweating, such as when exercising in hot or humid weather.  Not drinking enough fluid during strenuous exercise or during an illness.  Excessive urine output.  Fever.  Certain medicines. RISK FACTORS This condition is more likely to develop in:  People who are taking certain medicines that cause the body to lose excess fluid (diuretics).   People who have a chronic illness, such as diabetes, that may increase urination.  Older adults.   People who live at high altitudes.   People who participate in endurance sports.  SYMPTOMS  Mild Dehydration  Thirst.  Dry lips.  Slightly dry mouth.  Dry, warm skin. Moderate Dehydration  Very dry mouth.   Muscle cramps.   Dark urine and decreased urine production.   Decreased tear production.   Headache.   Light-headedness, especially when you stand up from a sitting position.  Severe Dehydration  Changes in skin.   Cold and clammy skin.   Skin does not spring back quickly when lightly pinched and released.   Changes in body fluids.   Extreme thirst.   No tears.   Not able to sweat when body temperature is high, such as in hot weather.   Minimal urine production.   Changes in vital signs.   Rapid, weak pulse (more than 100 beats per minute when you are sitting still).   Rapid breathing.   Low blood pressure.   Other changes.   Sunken eyes.   Cold hands and feet.   Confusion.  Lethargy and difficulty being awakened.  Fainting (syncope).   Short-term weight loss.   Unconsciousness. DIAGNOSIS  This condition may be diagnosed based on your symptoms. You may also have tests to determine how severe your dehydration is. These tests may include:   Urine tests.   Blood tests.  TREATMENT  Treatment for this condition depends on the severity. Mild or moderate dehydration can often be  treated at home. Treatment should be started right away. Do not wait until dehydration becomes severe. Severe dehydration needs to be treated at the hospital. Treatment for Mild Dehydration  Drinking plenty of water to replace the fluid you have lost.   Replacing minerals in your blood (electrolytes) that you may have lost.  Treatment for Moderate Dehydration  Consuming oral rehydration solution (ORS). Treatment for Severe Dehydration  Receiving fluid through an IV tube.   Receiving electrolyte solution through a feeding tube that is passed through your nose and into your stomach (nasogastric tube or NG tube).  Correcting any abnormalities in electrolytes. HOME CARE INSTRUCTIONS   Drink enough fluid to keep your urine clear or pale yellow.   Drink water or fluid slowly by taking small sips. You can also try sucking on ice cubes.  Have food or beverages that contain electrolytes. Examples include bananas and sports drinks.  Take over-the-counter and prescription medicines only as told by your health care provider.   Prepare ORS according to the manufacturer's instructions. Take sips of ORS every 5 minutes until your urine returns to normal.  If you have vomiting or diarrhea, continue to try to drink water, ORS, or both.   If you have diarrhea, avoid:     Beverages that contain caffeine.   Fruit juice.   Milk.   Carbonated soft drinks.  Do not take salt tablets. This can lead to the condition of having too much sodium in your body (hypernatremia).  SEEK MEDICAL CARE IF:  You cannot eat or drink without vomiting.  You have had moderate diarrhea during a period of more than 24 hours.  You have a fever. SEEK IMMEDIATE MEDICAL CARE IF:   You have extreme thirst.  You have severe diarrhea.  You have not urinated in 6-8 hours, or you have urinated only a small amount of very dark urine.  You have shriveled skin.  You are dizzy, confused, or both.   This  information is not intended to replace advice given to you by your health care provider. Make sure you discuss any questions you have with your health care provider.   Document Released: 08/25/2005 Document Revised: 05/16/2015 Document Reviewed: 01/10/2015 Elsevier Interactive Patient Education 2016 Elsevier Inc.  Cyanocobalamin, Vitamin B12 injection What is this medicine? CYANOCOBALAMIN (sye an oh koe BAL a min) is a man made form of vitamin B12. Vitamin B12 is used in the growth of healthy blood cells, nerve cells, and proteins in the body. It also helps with the metabolism of fats and carbohydrates. This medicine is used to treat people who can not absorb vitamin B12. This medicine may be used for other purposes; ask your health care provider or pharmacist if you have questions. What should I tell my health care provider before I take this medicine? They need to know if you have any of these conditions: -kidney disease -Leber's disease -megaloblastic anemia -an unusual or allergic reaction to cyanocobalamin, cobalt, other medicines, foods, dyes, or preservatives -pregnant or trying to get pregnant -breast-feeding How should I use this medicine? This medicine is injected into a muscle or deeply under the skin. It is usually given by a health care professional in a clinic or doctor's office. However, your doctor may teach you how to inject yourself. Follow all instructions. Talk to your pediatrician regarding the use of this medicine in children. Special care may be needed. Overdosage: If you think you have taken too much of this medicine contact a poison control center or emergency room at once. NOTE: This medicine is only for you. Do not share this medicine with others. What if I miss a dose? If you are given your dose at a clinic or doctor's office, call to reschedule your appointment. If you give your own injections and you miss a dose, take it as soon as you can. If it is almost time  for your next dose, take only that dose. Do not take double or extra doses. What may interact with this medicine? -colchicine -heavy alcohol intake This list may not describe all possible interactions. Give your health care provider a list of all the medicines, herbs, non-prescription drugs, or dietary supplements you use. Also tell them if you smoke, drink alcohol, or use illegal drugs. Some items may interact with your medicine. What should I watch for while using this medicine? Visit your doctor or health care professional regularly. You may need blood work done while you are taking this medicine. You may need to follow a special diet. Talk to your doctor. Limit your alcohol intake and avoid smoking to get the best benefit. What side effects may I notice from receiving this medicine? Side effects that you should report to your doctor or health care professional as soon as possible: -  allergic reactions like skin rash, itching or hives, swelling of the face, lips, or tongue -blue tint to skin -chest tightness, pain -difficulty breathing, wheezing -dizziness -red, swollen painful area on the leg Side effects that usually do not require medical attention (report to your doctor or health care professional if they continue or are bothersome): -diarrhea -headache This list may not describe all possible side effects. Call your doctor for medical advice about side effects. You may report side effects to FDA at 1-800-FDA-1088. Where should I keep my medicine? Keep out of the reach of children. Store at room temperature between 15 and 30 degrees C (59 and 85 degrees F). Protect from light. Throw away any unused medicine after the expiration date. NOTE: This sheet is a summary. It may not cover all possible information. If you have questions about this medicine, talk to your doctor, pharmacist, or health care provider.    2016, Elsevier/Gold Standard. (2007-12-06 22:10:20)

## 2016-01-30 ENCOUNTER — Ambulatory Visit (HOSPITAL_BASED_OUTPATIENT_CLINIC_OR_DEPARTMENT_OTHER): Payer: 59

## 2016-01-30 VITALS — BP 122/69 | HR 122 | Temp 98.1°F | Resp 18

## 2016-01-30 DIAGNOSIS — D519 Vitamin B12 deficiency anemia, unspecified: Secondary | ICD-10-CM

## 2016-01-30 DIAGNOSIS — D509 Iron deficiency anemia, unspecified: Secondary | ICD-10-CM | POA: Diagnosis not present

## 2016-01-30 MED ORDER — SODIUM CHLORIDE 0.9 % IV SOLN
Freq: Once | INTRAVENOUS | Status: AC
  Administered 2016-01-30: 14:00:00 via INTRAVENOUS
  Filled 2016-01-30: qty 4

## 2016-01-30 MED ORDER — SODIUM CHLORIDE 0.9 % IV SOLN
510.0000 mg | Freq: Once | INTRAVENOUS | Status: AC
  Administered 2016-01-30: 510 mg via INTRAVENOUS
  Filled 2016-01-30: qty 17

## 2016-01-30 MED ORDER — SODIUM CHLORIDE 0.9 % IV SOLN
Freq: Once | INTRAVENOUS | Status: AC
  Administered 2016-01-30: 14:00:00 via INTRAVENOUS

## 2016-01-30 NOTE — Patient Instructions (Signed)
Ferumoxytol injection What is this medicine? FERUMOXYTOL is an iron complex. Iron is used to make healthy red blood cells, which carry oxygen and nutrients throughout the body. This medicine is used to treat iron deficiency anemia in people with chronic kidney disease. This medicine may be used for other purposes; ask your health care provider or pharmacist if you have questions. What should I tell my health care provider before I take this medicine? They need to know if you have any of these conditions: -anemia not caused by low iron levels -high levels of iron in the blood -magnetic resonance imaging (MRI) test scheduled -an unusual or allergic reaction to iron, other medicines, foods, dyes, or preservatives -pregnant or trying to get pregnant -breast-feeding How should I use this medicine? This medicine is for injection into a vein. It is given by a health care professional in a hospital or clinic setting. Talk to your pediatrician regarding the use of this medicine in children. Special care may be needed. Overdosage: If you think you have taken too much of this medicine contact a poison control center or emergency room at once. NOTE: This medicine is only for you. Do not share this medicine with others. What if I miss a dose? It is important not to miss your dose. Call your doctor or health care professional if you are unable to keep an appointment. What may interact with this medicine? This medicine may interact with the following medications: -other iron products This list may not describe all possible interactions. Give your health care provider a list of all the medicines, herbs, non-prescription drugs, or dietary supplements you use. Also tell them if you smoke, drink alcohol, or use illegal drugs. Some items may interact with your medicine. What should I watch for while using this medicine? Visit your doctor or healthcare professional regularly. Tell your doctor or healthcare  professional if your symptoms do not start to get better or if they get worse. You may need blood work done while you are taking this medicine. You may need to follow a special diet. Talk to your doctor. Foods that contain iron include: whole grains/cereals, dried fruits, beans, or peas, leafy green vegetables, and organ meats (liver, kidney). What side effects may I notice from receiving this medicine? Side effects that you should report to your doctor or health care professional as soon as possible: -allergic reactions like skin rash, itching or hives, swelling of the face, lips, or tongue -breathing problems -changes in blood pressure -feeling faint or lightheaded, falls -fever or chills -flushing, sweating, or hot feelings -swelling of the ankles or feet Side effects that usually do not require medical attention (Report these to your doctor or health care professional if they continue or are bothersome.): -diarrhea -headache -nausea, vomiting -stomach pain This list may not describe all possible side effects. Call your doctor for medical advice about side effects. You may report side effects to FDA at 1-800-FDA-1088. Where should I keep my medicine? This drug is given in a hospital or clinic and will not be stored at home. NOTE: This sheet is a summary. It may not cover all possible information. If you have questions about this medicine, talk to your doctor, pharmacist, or health care provider.    2016, Elsevier/Gold Standard. (2012-04-09 15:23:36)  Dehydration, Adult Dehydration is a condition in which you do not have enough fluid or water in your body. It happens when you take in less fluid than you lose. Vital organs such as the kidneys,   brain, and heart cannot function without a proper amount of fluids. Any loss of fluids from the body can cause dehydration.  Dehydration can range from mild to severe. This condition should be treated right away to help prevent it from becoming  severe. CAUSES  This condition may be caused by:  Vomiting.  Diarrhea.  Excessive sweating, such as when exercising in hot or humid weather.  Not drinking enough fluid during strenuous exercise or during an illness.  Excessive urine output.  Fever.  Certain medicines. RISK FACTORS This condition is more likely to develop in:  People who are taking certain medicines that cause the body to lose excess fluid (diuretics).   People who have a chronic illness, such as diabetes, that may increase urination.  Older adults.   People who live at high altitudes.   People who participate in endurance sports.  SYMPTOMS  Mild Dehydration  Thirst.  Dry lips.  Slightly dry mouth.  Dry, warm skin. Moderate Dehydration  Very dry mouth.   Muscle cramps.   Dark urine and decreased urine production.   Decreased tear production.   Headache.   Light-headedness, especially when you stand up from a sitting position.  Severe Dehydration  Changes in skin.   Cold and clammy skin.   Skin does not spring back quickly when lightly pinched and released.   Changes in body fluids.   Extreme thirst.   No tears.   Not able to sweat when body temperature is high, such as in hot weather.   Minimal urine production.   Changes in vital signs.   Rapid, weak pulse (more than 100 beats per minute when you are sitting still).   Rapid breathing.   Low blood pressure.   Other changes.   Sunken eyes.   Cold hands and feet.   Confusion.  Lethargy and difficulty being awakened.  Fainting (syncope).   Short-term weight loss.   Unconsciousness. DIAGNOSIS  This condition may be diagnosed based on your symptoms. You may also have tests to determine how severe your dehydration is. These tests may include:   Urine tests.   Blood tests.  TREATMENT  Treatment for this condition depends on the severity. Mild or moderate dehydration can often be  treated at home. Treatment should be started right away. Do not wait until dehydration becomes severe. Severe dehydration needs to be treated at the hospital. Treatment for Mild Dehydration  Drinking plenty of water to replace the fluid you have lost.   Replacing minerals in your blood (electrolytes) that you may have lost.  Treatment for Moderate Dehydration  Consuming oral rehydration solution (ORS). Treatment for Severe Dehydration  Receiving fluid through an IV tube.   Receiving electrolyte solution through a feeding tube that is passed through your nose and into your stomach (nasogastric tube or NG tube).  Correcting any abnormalities in electrolytes. HOME CARE INSTRUCTIONS   Drink enough fluid to keep your urine clear or pale yellow.   Drink water or fluid slowly by taking small sips. You can also try sucking on ice cubes.  Have food or beverages that contain electrolytes. Examples include bananas and sports drinks.  Take over-the-counter and prescription medicines only as told by your health care provider.   Prepare ORS according to the manufacturer's instructions. Take sips of ORS every 5 minutes until your urine returns to normal.  If you have vomiting or diarrhea, continue to try to drink water, ORS, or both.   If you have diarrhea, avoid:     Beverages that contain caffeine.   Fruit juice.   Milk.   Carbonated soft drinks.  Do not take salt tablets. This can lead to the condition of having too much sodium in your body (hypernatremia).  SEEK MEDICAL CARE IF:  You cannot eat or drink without vomiting.  You have had moderate diarrhea during a period of more than 24 hours.  You have a fever. SEEK IMMEDIATE MEDICAL CARE IF:   You have extreme thirst.  You have severe diarrhea.  You have not urinated in 6-8 hours, or you have urinated only a small amount of very dark urine.  You have shriveled skin.  You are dizzy, confused, or both.   This  information is not intended to replace advice given to you by your health care provider. Make sure you discuss any questions you have with your health care provider.   Document Released: 08/25/2005 Document Revised: 05/16/2015 Document Reviewed: 01/10/2015 Elsevier Interactive Patient Education 2016 Elsevier Inc.  

## 2016-02-07 ENCOUNTER — Other Ambulatory Visit: Payer: Self-pay | Admitting: Hematology and Oncology

## 2016-02-22 ENCOUNTER — Encounter: Payer: Self-pay | Admitting: *Deleted

## 2016-02-22 ENCOUNTER — Other Ambulatory Visit: Payer: Self-pay | Admitting: Hematology and Oncology

## 2016-02-22 ENCOUNTER — Telehealth: Payer: Self-pay | Admitting: *Deleted

## 2016-02-22 DIAGNOSIS — D519 Vitamin B12 deficiency anemia, unspecified: Secondary | ICD-10-CM

## 2016-02-22 DIAGNOSIS — D509 Iron deficiency anemia, unspecified: Secondary | ICD-10-CM

## 2016-02-22 NOTE — Telephone Encounter (Signed)
Spoke with patient, let her know we received lab results and  schedulers would be calling her with several appts for June and august.

## 2016-02-22 NOTE — Telephone Encounter (Signed)
TC from patient asking if an appt has been made for her. Her OB GYN doctor's office was to have fax'd recent lab results a CBC. Patient states her HGB remains low @ 8.7 Advised patient that Dr. Maxine Glenngorsuch's nurse is aware of appointment need and that a scheduler will be contacting her.  Pt verbalized  Understanding.  TC to Dixie, RN with Dr. Bertis RuddyGorsuch, to advise her of pt phone call.

## 2016-02-22 NOTE — Telephone Encounter (Signed)
I placed POF for her to get IV iron and B12 on 6/23 and 6/30 (earliest I can arrange) and added an extra appt for labs on 8/4.

## 2016-02-25 ENCOUNTER — Telehealth: Payer: Self-pay | Admitting: Hematology and Oncology

## 2016-02-25 NOTE — Telephone Encounter (Signed)
s.w. pt and advised on all appts....pt ok and aware °

## 2016-02-26 ENCOUNTER — Other Ambulatory Visit: Payer: Self-pay | Admitting: *Deleted

## 2016-02-26 ENCOUNTER — Telehealth: Payer: Self-pay | Admitting: *Deleted

## 2016-02-26 NOTE — Telephone Encounter (Signed)
Per patient request I have moved appt from Friday to tomorrow. Patient aware and desk RN aware

## 2016-02-26 NOTE — Telephone Encounter (Signed)
Notified [facility] RN,  Hazel SamsMary Early, of pt scheduled for IV iron tomorrow morning at [facility].  Her phone number is [phone].

## 2016-02-27 ENCOUNTER — Encounter (HOSPITAL_COMMUNITY): Payer: Self-pay | Admitting: *Deleted

## 2016-02-27 ENCOUNTER — Ambulatory Visit (HOSPITAL_BASED_OUTPATIENT_CLINIC_OR_DEPARTMENT_OTHER): Payer: 59

## 2016-02-27 ENCOUNTER — Inpatient Hospital Stay (HOSPITAL_COMMUNITY)
Admission: AD | Admit: 2016-02-27 | Discharge: 2016-02-27 | Disposition: A | Discharge status: Home / Self Care | Payer: 59 | Source: Ambulatory Visit | Attending: Obstetrics and Gynecology | Admitting: Obstetrics and Gynecology

## 2016-02-27 ENCOUNTER — Ambulatory Visit: Payer: 59

## 2016-02-27 ENCOUNTER — Inpatient Hospital Stay (HOSPITAL_COMMUNITY): Payer: 59

## 2016-02-27 VITALS — BP 129/79 | HR 107 | Temp 98.5°F | Resp 20

## 2016-02-27 DIAGNOSIS — O24312 Unspecified pre-existing diabetes mellitus in pregnancy, second trimester: Secondary | ICD-10-CM

## 2016-02-27 DIAGNOSIS — D509 Iron deficiency anemia, unspecified: Secondary | ICD-10-CM

## 2016-02-27 DIAGNOSIS — Z3A24 24 weeks gestation of pregnancy: Secondary | ICD-10-CM

## 2016-02-27 DIAGNOSIS — O26892 Other specified pregnancy related conditions, second trimester: Secondary | ICD-10-CM | POA: Diagnosis not present

## 2016-02-27 DIAGNOSIS — D518 Other vitamin B12 deficiency anemias: Secondary | ICD-10-CM

## 2016-02-27 DIAGNOSIS — N898 Other specified noninflammatory disorders of vagina: Secondary | ICD-10-CM | POA: Insufficient documentation

## 2016-02-27 DIAGNOSIS — O10919 Unspecified pre-existing hypertension complicating pregnancy, unspecified trimester: Secondary | ICD-10-CM

## 2016-02-27 DIAGNOSIS — O10912 Unspecified pre-existing hypertension complicating pregnancy, second trimester: Secondary | ICD-10-CM | POA: Insufficient documentation

## 2016-02-27 DIAGNOSIS — O24112 Pre-existing diabetes mellitus, type 2, in pregnancy, second trimester: Secondary | ICD-10-CM | POA: Insufficient documentation

## 2016-02-27 DIAGNOSIS — D519 Vitamin B12 deficiency anemia, unspecified: Secondary | ICD-10-CM

## 2016-02-27 LAB — WET PREP, GENITAL
Clue Cells Wet Prep HPF POC: NONE SEEN
TRICH WET PREP: NONE SEEN
YEAST WET PREP: NONE SEEN

## 2016-02-27 LAB — URINALYSIS, ROUTINE W REFLEX MICROSCOPIC
Bilirubin Urine: NEGATIVE
Glucose, UA: NEGATIVE mg/dL
Hgb urine dipstick: NEGATIVE
Ketones, ur: NEGATIVE mg/dL
NITRITE: NEGATIVE
Protein, ur: NEGATIVE mg/dL
SPECIFIC GRAVITY, URINE: 1.01 (ref 1.005–1.030)
pH: 5.5 (ref 5.0–8.0)

## 2016-02-27 LAB — URINE MICROSCOPIC-ADD ON: RBC / HPF: NONE SEEN RBC/hpf (ref 0–5)

## 2016-02-27 LAB — POCT FERN TEST: POCT Fern Test: NEGATIVE

## 2016-02-27 LAB — AMNISURE RUPTURE OF MEMBRANE (ROM) NOT AT ARMC: Amnisure ROM: NEGATIVE

## 2016-02-27 MED ORDER — SODIUM CHLORIDE 0.9 % IV SOLN
510.0000 mg | Freq: Once | INTRAVENOUS | Status: AC
  Administered 2016-02-27: 510 mg via INTRAVENOUS
  Filled 2016-02-27: qty 17

## 2016-02-27 MED ORDER — SODIUM CHLORIDE 0.9 % IV SOLN
Freq: Once | INTRAVENOUS | Status: AC
  Administered 2016-02-27: 09:00:00 via INTRAVENOUS

## 2016-02-27 MED ORDER — CYANOCOBALAMIN 1000 MCG/ML IJ SOLN
1000.0000 ug | Freq: Once | INTRAMUSCULAR | Status: AC
  Administered 2016-02-27: 1000 ug via INTRAMUSCULAR

## 2016-02-27 MED ORDER — CYANOCOBALAMIN 1000 MCG/ML IJ SOLN
INTRAMUSCULAR | Status: AC
Start: 2016-02-27 — End: 2016-02-27
  Filled 2016-02-27: qty 1

## 2016-02-27 MED ORDER — SODIUM CHLORIDE 0.9 % IV SOLN
Freq: Once | INTRAVENOUS | Status: AC
  Administered 2016-02-27: 09:00:00 via INTRAVENOUS
  Filled 2016-02-27: qty 4

## 2016-02-27 NOTE — MAU Note (Signed)
[facility] at [redacted] weeks gestation with c/o leaking fluid. Fetus active. Denies pain or bleeding.

## 2016-02-27 NOTE — Discharge Instructions (Signed)

## 2016-02-27 NOTE — Progress Notes (Signed)
Pt given injection in infusion room. 

## 2016-02-27 NOTE — MAU Provider Note (Signed)
History   [id number]   Chief Complaint  Patient presents with  . Rupture of Membranes    HPI Anna Nguyen is a 30 y.o. female  G3P0020 here with report of watery vaginal discharge that began last Thursday. Reports several episodes of "wet panties" daily. Feels like she urinated on herself but looks clear & odorless. Denies abdominal pain or vaginal bleeding. Positive fetal movement. All other systems negative.    No LMP recorded. Patient is pregnant.  OB History  Gravida Para Term Preterm AB SAB TAB Ectopic Multiple Living  0    # Outcome Date GA Lbr Len/2nd Weight Sex Delivery Anes PTL Lv  3 Current           2 TAB           1 TAB               Past Medical History  Diagnosis Date  . Anemia, iron deficiency   . Type II or unspecified type diabetes mellitus without mention of complication, not stated as uncontrolled   . HTN (hypertension)   . PCOS (polycystic ovarian syndrome)   . Pregnant state, incidental     Family History  Problem Relation Age of Onset  . Arthritis Other   . Diabetes Other   . Hypertension Other   . Hyperlipidemia Other   . Prostate cancer Other   . Cancer Maternal Aunt     breast    Social History   Social History  . Marital Status: Single    Spouse Name: N/A  . Number of Children: N/A  . Years of Education: N/A   Occupational History  . SALES     [employer]   Social History Main Topics  . Smoking status: Never Smoker   . Smokeless tobacco: Never Used  . Alcohol Use: No  . Drug Use: No  . Sexual Activity:    Partners: Male    Birth Control/ Protection: None   Other Topics Concern  . None   Social History Narrative   Regular Exercise-yes   Caffeine use: occasionaly                Allergies  Allergen Reactions  . Aspirin     Per surgeon  . Latex   . Nsaids     Per Careers adviser.    No current facility-administered medications on file prior to encounter.   Current Outpatient Prescriptions on File Prior to  Encounter  Medication Sig Dispense Refill  . ondansetron (ZOFRAN) 8 MG tablet Take 8 mg by mouth as needed.  6  . promethazine (PHENERGAN) 25 MG tablet Take 25 mg by mouth every 6 (six) hours as needed.  4  . sertraline (ZOLOFT) 50 MG tablet Take 50 mg by mouth daily.  3     Review of Systems  Constitutional: Negative.   Gastrointestinal: Negative.   Genitourinary: Positive for vaginal discharge. Negative for vaginal bleeding.     Physical Exam   Filed Vitals:   02/27/16 1232  BP: 117/62  Pulse: 103  Temp: 98.1 F (36.7 C)  TempSrc: Oral  Resp: 16  Height:  (1.753 m)  Weight: 224 lb (101.606 kg)    Physical Exam  Nursing note and vitals reviewed. Constitutional: She is oriented to person, place, and time. She appears well-developed and well-nourished. No distress.  HENT:  Head: Normocephalic and atraumatic.  Eyes: Conjunctivae are normal. Right eye exhibits  no discharge. Left eye exhibits no discharge. No scleral icterus.  Neck: Normal range of motion.  Respiratory: Effort normal. No respiratory distress.  Genitourinary: No bleeding in the vagina. Vaginal discharge (small amount of white mucoid discharge) found.  No pooling  Neurological: She is alert and oriented to person, place, and time.  Skin: Skin is warm and dry. She is not diaphoretic.  Psychiatric: She has a normal mood and affect. Her behavior is normal. Judgment and thought content normal.   Fetal Tracing:  Baseline: 140 Variability: moderate Accelerations: none Decelerations: none  Toco: none   MAU Course  Procedures Results for orders placed or performed during the hospital encounter of 02/27/16 (from the past 24 hour(s))  Urinalysis, Routine w reflex microscopic ([facility])     Status: Abnormal   Collection Time: 02/27/16 12:11 PM  Result Value Ref Range   Color, Urine YELLOW YELLOW   APPearance CLEAR CLEAR   Specific Gravity, Urine 1.010 1.005 - 1.030   pH 5.5 5.0 - 8.0   Glucose,  UA NEGATIVE NEGATIVE mg/dL   Hgb urine dipstick NEGATIVE NEGATIVE   Bilirubin Urine NEGATIVE NEGATIVE   Ketones, ur NEGATIVE NEGATIVE mg/dL   Protein, ur NEGATIVE NEGATIVE mg/dL   Nitrite NEGATIVE NEGATIVE   Leukocytes, UA SMALL (A) NEGATIVE  Urine microscopic-add on     Status: Abnormal   Collection Time: 02/27/16 12:11 PM  Result Value Ref Range   Squamous Epithelial / LPF 0-5 (A) NONE SEEN   WBC, UA 0-5 0 - 5 WBC/hpf   RBC / HPF NONE SEEN 0 - 5 RBC/hpf   Bacteria, UA FEW (A) NONE SEEN  Amnisure rupture of membrane (rom)[facility]     Status: None   Collection Time: 02/27/16  1:22 PM  Result Value Ref Range   Amnisure ROM NEGATIVE   Fern Test     Status: None   Collection Time: 02/27/16  1:22 PM  Result Value Ref Range   POCT Fern Test Negative = intact amniotic membranes   Wet prep, genital     Status: Abnormal   Collection Time: 02/27/16  1:22 PM  Result Value Ref Range   Yeast Wet Prep HPF POC NONE SEEN NONE SEEN   Trich, Wet Prep NONE SEEN NONE SEEN   Clue Cells Wet Prep HPF POC NONE SEEN NONE SEEN   WBC, Wet Prep HPF POC MODERATE (A) NONE SEEN   Sperm PRESENT     MDM Category 1 tracing -- no contractions No pooling, fern negative, amnisure negative Pt requesting to check fluid around baby -- ok per Dr. Dareen PianoAnderson S/w Dr. Dareen PianoAnderson -- if labs negative & ultrasound normal, ok to discharge home  Ultrasound -- fluid subjectively normal, largest pocket 7 cm Assessment and Plan  A: 1. Vaginal discharge in pregnancy in second trimester   2. [redacted] weeks gestation of pregnancy   3. Hypertension in pregnancy, pre-existing   4. Pre-existing diabetes mellitus in pregnancy in second trimester     P; Discharge home Preterm labor precautions Keep f/u with ob Discussed reasons to return to MAU   Judeth HornErin Kendyn Zaman, NP 02/27/2016 12:38 PM

## 2016-02-27 NOTE — MAU Note (Signed)
Urine sent to lab 

## 2016-02-27 NOTE — Progress Notes (Signed)
While doing pt NST after her infusion pt was asking about watery discharge she is having, instructed her to call her OB office for further evaluation. Reassuring NST for gestation age pre and post procedure.

## 2016-02-27 NOTE — Patient Instructions (Signed)

## 2016-02-27 NOTE — Progress Notes (Signed)
Patient observed for 30 minutes post Feraheme infusion.

## 2016-02-29 ENCOUNTER — Ambulatory Visit: Payer: 59

## 2016-03-07 ENCOUNTER — Ambulatory Visit (HOSPITAL_BASED_OUTPATIENT_CLINIC_OR_DEPARTMENT_OTHER): Payer: 59

## 2016-03-07 VITALS — BP 108/71 | HR 101 | Temp 98.1°F

## 2016-03-07 DIAGNOSIS — D509 Iron deficiency anemia, unspecified: Secondary | ICD-10-CM

## 2016-03-07 DIAGNOSIS — D519 Vitamin B12 deficiency anemia, unspecified: Secondary | ICD-10-CM

## 2016-03-07 MED ORDER — SODIUM CHLORIDE 0.9 % IV SOLN: Freq: Once | INTRAVENOUS | Status: DC

## 2016-03-07 MED ORDER — SODIUM CHLORIDE 0.9 % IV SOLN
Freq: Once | INTRAVENOUS | Status: AC
  Administered 2016-03-07: 09:00:00 via INTRAVENOUS
  Filled 2016-03-07: qty 4

## 2016-03-07 MED ORDER — SODIUM CHLORIDE 0.9 % IV SOLN
Freq: Once | INTRAVENOUS | Status: AC
  Administered 2016-03-07: 09:00:00 via INTRAVENOUS

## 2016-03-07 MED ORDER — SODIUM CHLORIDE 0.9 % IV SOLN
510.0000 mg | Freq: Once | INTRAVENOUS | Status: AC
  Administered 2016-03-07: 510 mg via INTRAVENOUS
  Filled 2016-03-07: qty 17

## 2016-03-07 NOTE — Progress Notes (Signed)
Informed dr Dareen Pianoanderson of reassuring fetal tracings before and after iron infusions.

## 2016-03-07 NOTE — Progress Notes (Signed)
Patient states she has been nauseous for several days and has not ate well. Patient states she feels she is dehydrated. Order given and carried out for 1 liter of normal saline over 2 hours and 8 mg Zofran IV per Dr. Bertis RuddyGorsuch.   Patient observed post feraheme infusion no complications or concerned noted or expressed. Patient verbalized understanding to call our office with any further questions or concerns.

## 2016-03-07 NOTE — Patient Instructions (Signed)
Ferumoxytol injection (Feraheme) What is this medicine? FERUMOXYTOL is an iron complex. Iron is used to make healthy red blood cells, which carry oxygen and nutrients throughout the body. This medicine is used to treat iron deficiency anemia in people with chronic kidney disease. This medicine may be used for other purposes; ask your health care provider or pharmacist if you have questions. What should I tell my health care provider before I take this medicine? They need to know if you have any of these conditions: -anemia not caused by low iron levels -high levels of iron in the blood -magnetic resonance imaging (MRI) test scheduled -an unusual or allergic reaction to iron, other medicines, foods, dyes, or preservatives -pregnant or trying to get pregnant -breast-feeding How should I use this medicine? This medicine is for injection into a vein. It is given by a health care professional in a hospital or clinic setting. Talk to your pediatrician regarding the use of this medicine in children. Special care may be needed. Overdosage: If you think you have taken too much of this medicine contact a poison control center or emergency room at once. NOTE: This medicine is only for you. Do not share this medicine with others. What if I miss a dose? It is important not to miss your dose. Call your doctor or health care professional if you are unable to keep an appointment. What may interact with this medicine? This medicine may interact with the following medications: -other iron products This list may not describe all possible interactions. Give your health care provider a list of all the medicines, herbs, non-prescription drugs, or dietary supplements you use. Also tell them if you smoke, drink alcohol, or use illegal drugs. Some items may interact with your medicine. What should I watch for while using this medicine? Visit your doctor or healthcare professional regularly. Tell your doctor or  healthcare professional if your symptoms do not start to get better or if they get worse. You may need blood work done while you are taking this medicine. You may need to follow a special diet. Talk to your doctor. Foods that contain iron include: whole grains/cereals, dried fruits, beans, or peas, leafy green vegetables, and organ meats (liver, kidney). What side effects may I notice from receiving this medicine? Side effects that you should report to your doctor or health care professional as soon as possible: -allergic reactions like skin rash, itching or hives, swelling of the face, lips, or tongue -breathing problems -changes in blood pressure -feeling faint or lightheaded, falls -fever or chills -flushing, sweating, or hot feelings -swelling of the ankles or feet Side effects that usually do not require medical attention (Report these to your doctor or health care professional if they continue or are bothersome.): -diarrhea -headache -nausea, vomiting -stomach pain This list may not describe all possible side effects. Call your doctor for medical advice about side effects. You may report side effects to FDA at 1-800-FDA-1088. Where should I keep my medicine? This drug is given in a hospital or clinic and will not be stored at home. NOTE: This sheet is a summary. It may not cover all possible information. If you have questions about this medicine, talk to your doctor, pharmacist, or health care provider.    2016, Elsevier/Gold Standard. (2012-04-09 15:23:36)   Dehydration, Adult Dehydration is a condition in which you do not have enough fluid or water in your body. It happens when you take in less fluid than you lose. Vital organs such as  the kidneys, brain, and heart cannot function without a proper amount of fluids. Any loss of fluids from the body can cause dehydration.  Dehydration can range from mild to severe. This condition should be treated right away to help prevent it from  becoming severe. CAUSES  This condition may be caused by:  Vomiting.  Diarrhea.  Excessive sweating, such as when exercising in hot or humid weather.  Not drinking enough fluid during strenuous exercise or during an illness.  Excessive urine output.  Fever.  Certain medicines. RISK FACTORS This condition is more likely to develop in:  People who are taking certain medicines that cause the body to lose excess fluid (diuretics).   People who have a chronic illness, such as diabetes, that may increase urination.  Older adults.   People who live at high altitudes.   People who participate in endurance sports.  SYMPTOMS  Mild Dehydration  Thirst.  Dry lips.  Slightly dry mouth.  Dry, warm skin. Moderate Dehydration  Very dry mouth.   Muscle cramps.   Dark urine and decreased urine production.   Decreased tear production.   Headache.   Light-headedness, especially when you stand up from a sitting position.  Severe Dehydration  Changes in skin.   Cold and clammy skin.   Skin does not spring back quickly when lightly pinched and released.   Changes in body fluids.   Extreme thirst.   No tears.   Not able to sweat when body temperature is high, such as in hot weather.   Minimal urine production.   Changes in vital signs.   Rapid, weak pulse (more than 100 beats per minute when you are sitting still).   Rapid breathing.   Low blood pressure.   Other changes.   Sunken eyes.   Cold hands and feet.   Confusion.  Lethargy and difficulty being awakened.  Fainting (syncope).   Short-term weight loss.   Unconsciousness. DIAGNOSIS  This condition may be diagnosed based on your symptoms. You may also have tests to determine how severe your dehydration is. These tests may include:   Urine tests.   Blood tests.  TREATMENT  Treatment for this condition depends on the severity. Mild or moderate dehydration can  often be treated at home. Treatment should be started right away. Do not wait until dehydration becomes severe. Severe dehydration needs to be treated at the hospital. Treatment for Mild Dehydration  Drinking plenty of water to replace the fluid you have lost.   Replacing minerals in your blood (electrolytes) that you may have lost.  Treatment for Moderate Dehydration  Consuming oral rehydration solution (ORS). Treatment for Severe Dehydration  Receiving fluid through an IV tube.   Receiving electrolyte solution through a feeding tube that is passed through your nose and into your stomach (nasogastric tube or NG tube).  Correcting any abnormalities in electrolytes. HOME CARE INSTRUCTIONS   Drink enough fluid to keep your urine clear or pale yellow.   Drink water or fluid slowly by taking small sips. You can also try sucking on ice cubes.  Have food or beverages that contain electrolytes. Examples include bananas and sports drinks.  Take over-the-counter and prescription medicines only as told by your health care provider.   Prepare ORS according to the manufacturer's instructions. Take sips of ORS every 5 minutes until your urine returns to normal.  If you have vomiting or diarrhea, continue to try to drink water, ORS, or both.   If you have diarrhea,  avoid:   Beverages that contain caffeine.   Fruit juice.   Milk.   Carbonated soft drinks.  Do not take salt tablets. This can lead to the condition of having too much sodium in your body (hypernatremia).  SEEK MEDICAL CARE IF:  You cannot eat or drink without vomiting.  You have had moderate diarrhea during a period of more than 24 hours.  You have a fever. SEEK IMMEDIATE MEDICAL CARE IF:   You have extreme thirst.  You have severe diarrhea.  You have not urinated in 6-8 hours, or you have urinated only a small amount of very dark urine.  You have shriveled skin.  You are dizzy, confused, or  both.   This information is not intended to replace advice given to you by your health care provider. Make sure you discuss any questions you have with your health care provider.   Document Released: 08/25/2005 Document Revised: 05/16/2015 Document Reviewed: 01/10/2015 Elsevier Interactive Patient Education [employer]2016 Elsevier Inc.

## 2016-03-15 ENCOUNTER — Encounter (HOSPITAL_COMMUNITY): Payer: Self-pay

## 2016-03-15 ENCOUNTER — Inpatient Hospital Stay (HOSPITAL_COMMUNITY)
Admission: AD | Admit: 2016-03-15 | Discharge: 2016-03-15 | Disposition: A | Discharge status: Home / Self Care | Payer: 59 | Source: Ambulatory Visit | Attending: Obstetrics and Gynecology | Admitting: Obstetrics and Gynecology

## 2016-03-15 DIAGNOSIS — O26892 Other specified pregnancy related conditions, second trimester: Secondary | ICD-10-CM | POA: Diagnosis not present

## 2016-03-15 DIAGNOSIS — E282 Polycystic ovarian syndrome: Secondary | ICD-10-CM | POA: Diagnosis not present

## 2016-03-15 DIAGNOSIS — Z886 Allergy status to analgesic agent status: Secondary | ICD-10-CM | POA: Diagnosis not present

## 2016-03-15 DIAGNOSIS — O99012 Anemia complicating pregnancy, second trimester: Secondary | ICD-10-CM | POA: Insufficient documentation

## 2016-03-15 DIAGNOSIS — D509 Iron deficiency anemia, unspecified: Secondary | ICD-10-CM | POA: Insufficient documentation

## 2016-03-15 DIAGNOSIS — Z91040 Latex allergy status: Secondary | ICD-10-CM | POA: Insufficient documentation

## 2016-03-15 DIAGNOSIS — Z3A27 27 weeks gestation of pregnancy: Secondary | ICD-10-CM | POA: Diagnosis not present

## 2016-03-15 DIAGNOSIS — O162 Unspecified maternal hypertension, second trimester: Secondary | ICD-10-CM | POA: Diagnosis not present

## 2016-03-15 DIAGNOSIS — O24112 Pre-existing diabetes mellitus, type 2, in pregnancy, second trimester: Secondary | ICD-10-CM | POA: Diagnosis not present

## 2016-03-15 DIAGNOSIS — E119 Type 2 diabetes mellitus without complications: Secondary | ICD-10-CM | POA: Diagnosis not present

## 2016-03-15 DIAGNOSIS — R112 Nausea with vomiting, unspecified: Secondary | ICD-10-CM | POA: Diagnosis present

## 2016-03-15 DIAGNOSIS — O212 Late vomiting of pregnancy: Secondary | ICD-10-CM

## 2016-03-15 LAB — URINALYSIS, ROUTINE W REFLEX MICROSCOPIC
Bilirubin Urine: NEGATIVE
Glucose, UA: 250 mg/dL — AB
Hgb urine dipstick: NEGATIVE
KETONES UR: NEGATIVE mg/dL
LEUKOCYTES UA: NEGATIVE
NITRITE: NEGATIVE
PH: 5.5 (ref 5.0–8.0)
Protein, ur: NEGATIVE mg/dL
Specific Gravity, Urine: >1.03 — ABNORMAL HIGH (ref 1.005–1.030)

## 2016-03-15 LAB — CBC
HCT: 29.3 % — ABNORMAL LOW (ref 36.0–46.0)
HEMOGLOBIN: 9.4 g/dL — AB (ref 12.0–15.0)
MCH: 27.5 pg (ref 26.0–34.0)
MCHC: 32.1 g/dL (ref 30.0–36.0)
MCV: 85.7 fL (ref 78.0–100.0)
Platelets: 157 10*3/uL (ref 150–400)
RBC: 3.42 MIL/uL — AB (ref 3.87–5.11)
RDW: 20.8 % — ABNORMAL HIGH (ref 11.5–15.5)
WBC: 8.9 10*3/uL (ref 4.0–10.5)

## 2016-03-15 LAB — COMPREHENSIVE METABOLIC PANEL
ALBUMIN: 3.4 g/dL — AB (ref 3.5–5.0)
ALK PHOS: 64 U/L (ref 38–126)
ALT: 13 U/L — AB (ref 14–54)
AST: 16 U/L (ref 15–41)
Anion gap: 6 (ref 5–15)
BUN: 7 mg/dL (ref 6–20)
CALCIUM: 8.8 mg/dL — AB (ref 8.9–10.3)
CO2: 22 mmol/L (ref 22–32)
CREATININE: 0.5 mg/dL (ref 0.44–1.00)
Chloride: 108 mmol/L (ref 101–111)
GFR calc non Af Amer: >60 mL/min (ref >60)
GLUCOSE: 72 mg/dL (ref 65–99)
Potassium: 3.6 mmol/L (ref 3.5–5.1)
SODIUM: 136 mmol/L (ref 135–145)
Total Bilirubin: 0.2 mg/dL — ABNORMAL LOW (ref 0.3–1.2)
Total Protein: 6.9 g/dL (ref 6.5–8.1)

## 2016-03-15 LAB — GLUCOSE, CAPILLARY
Glucose-Capillary: 68 mg/dL (ref 65–99)
Glucose-Capillary: 211 mg/dL — ABNORMAL HIGH (ref 65–99)
Glucose-Capillary: 64 mg/dL — ABNORMAL LOW (ref 65–99)

## 2016-03-15 MED ORDER — DEXTROSE 5 % IN LACTATED RINGERS IV BOLUS
1000.0000 mL | Freq: Once | INTRAVENOUS | Status: AC
  Administered 2016-03-15: 1000 mL via INTRAVENOUS

## 2016-03-15 MED ORDER — SODIUM CHLORIDE 0.9 % IV SOLN
8.0000 mg | Freq: Once | INTRAVENOUS | Status: AC
  Administered 2016-03-15: 8 mg via INTRAVENOUS
  Filled 2016-03-15: qty 4

## 2016-03-15 MED ORDER — FAMOTIDINE IN NACL 20-0.9 MG/50ML-% IV SOLN
20.0000 mg | Freq: Once | INTRAVENOUS | Status: AC
  Administered 2016-03-15: 20 mg via INTRAVENOUS
  Filled 2016-03-15: qty 50

## 2016-03-15 MED ORDER — LACTATED RINGERS IV BOLUS (SEPSIS)
1000.0000 mL | Freq: Once | INTRAVENOUS | Status: AC
  Administered 2016-03-15: 1000 mL via INTRAVENOUS

## 2016-03-15 NOTE — MAU Provider Note (Signed)
History     CSN: [id number]  Arrival date and time: 03/15/16 4098   First Provider Initiated Contact with Patient 03/15/16 2007      Chief Complaint  Patient presents with  . Emesis During Pregnancy  . Diarrhea   HPI  Anna Nguyen is a 30 y.o. G3P0020 at [redacted]w[redacted]d who presents with nausea, vomiting, & diarrhea. Reports continued n/v throughout pregnancy. Symptoms worsened 4 days ago & has included diarrhea. Reports 1 episode of watery/loose green stool in the last 24 hours. Has vomited 4 times in the last 24 hours. Took phenergan this morning at 10 am. Has been taking diclegis, zofran, reglan, & phenergan with minimal relief.  Patient concerned regarding blood sugar. States her blood sugar has been too low and she thinks most of her symptoms are due to low blood sugars. Reports hx of type 1 diabetes as a teenager; states her pancreas didn't work at all & she was insulin dependent until her early 20's when her pancreas started working again and "shocked" her doctors. Has not been on any diabetes related medications since then.  Denies abdominal pain, fever, vaginal bleeding, LOF, or sick contacts. Positive fetal movement.   OB History    Gravida Para Term Preterm AB TAB SAB Ectopic Multiple Living   3    2 2     0      Past Medical History  Diagnosis Date  . Anemia, iron deficiency   . Type II or unspecified type diabetes mellitus without mention of complication, not stated as uncontrolled   . HTN (hypertension)   . PCOS (polycystic ovarian syndrome)     Past Surgical History  Procedure Laterality Date  . Tonsillectomy    . Laparoscopic gastric banding      Family History  Problem Relation Age of Onset  . Arthritis Other   . Diabetes Other   . Hypertension Other   . Hyperlipidemia Other   . Prostate cancer Other   . Cancer Maternal Aunt     breast    Social History  Substance Use Topics  . Smoking status: Never Smoker   . Smokeless tobacco: Never Used  . Alcohol  Use: No    Allergies:  Allergies  Allergen Reactions  . Aspirin     Per surgeon  . Latex   . Nsaids     Per Careers adviser.    Prescriptions prior to admission  Medication Sig Dispense Refill Last Dose  . diphenhydrAMINE (BENADRYL) 25 MG tablet Take 25 mg by mouth every 6 (six) hours as needed for allergies.   02/27/2016 at Unknown time  . ondansetron (ZOFRAN-ODT) 8 MG disintegrating tablet Take 8 mg by mouth every 8 (eight) hours as needed for nausea or vomiting.   Past Week at Unknown time  . promethazine (PHENERGAN) 25 MG tablet Take 25 mg by mouth every 6 (six) hours as needed for nausea or vomiting.   4 02/27/2016 at Unknown time  . sertraline (ZOLOFT) 50 MG tablet Take 50 mg by mouth daily.  3 02/27/2016 at Unknown time    Review of Systems  Constitutional: Positive for malaise/fatigue. Negative for fever, chills and weight loss.  Gastrointestinal: Positive for heartburn, nausea, vomiting and diarrhea. Negative for abdominal pain, constipation, blood in stool and melena.  Genitourinary: Negative.    Physical Exam   Blood pressure 128/74, pulse 90, temperature 98.2 F (36.8 C), resp. rate 18, height 5' 8.5" (1.74 m), weight 227 lb 9.6 oz (103.239 kg).  Physical Exam  Nursing note and vitals reviewed. Constitutional: She is oriented to person, place, and time. She appears well-developed and well-nourished. No distress.  HENT:  Head: Normocephalic and atraumatic.  Eyes: Conjunctivae are normal. Right eye exhibits no discharge. Left eye exhibits no discharge. No scleral icterus.  Neck: Normal range of motion.  Cardiovascular: Normal rate, regular rhythm and normal heart sounds.   No murmur heard. Respiratory: Effort normal and breath sounds normal. No respiratory distress. She has no wheezes.  GI: Soft. Bowel sounds are normal. There is no tenderness.  Neurological: She is alert and oriented to person, place, and time.  Skin: Skin is warm and dry. She is not diaphoretic.   Psychiatric: She has a normal mood and affect. Her behavior is normal. Judgment and thought content normal.   Fetal Tracing:  Baseline: 140 Variability: moderate Accelerations: + Decelerations: none  Toco: none   MAU Course  Procedures Results for orders placed or performed during the hospital encounter of 03/15/16 (from the past 24 hour(s))  Urinalysis, Routine w reflex microscopic (not at [facility])     Status: Abnormal   Collection Time: 03/15/16  7:25 PM  Result Value Ref Range   Color, Urine YELLOW YELLOW   APPearance CLEAR CLEAR   Specific Gravity, Urine >1.030 (H) 1.005 - 1.030   pH 5.5 5.0 - 8.0   Glucose, UA 250 (A) NEGATIVE mg/dL   Hgb urine dipstick NEGATIVE NEGATIVE   Bilirubin Urine NEGATIVE NEGATIVE   Ketones, ur NEGATIVE NEGATIVE mg/dL   Protein, ur NEGATIVE NEGATIVE mg/dL   Nitrite NEGATIVE NEGATIVE   Leukocytes, UA NEGATIVE NEGATIVE  CBC     Status: Abnormal   Collection Time: 03/15/16  8:20 PM  Result Value Ref Range   WBC 8.9 4.0 - 10.5 K/uL   RBC 3.42 (L) 3.87 - 5.11 MIL/uL   Hemoglobin 9.4 (L) 12.0 - 15.0 g/dL   HCT 16.1 (L) 09.6 - 04.5 %   MCV 85.7 78.0 - 100.0 fL   MCH 27.5 26.0 - 34.0 pg   MCHC 32.1 30.0 - 36.0 g/dL   RDW 40.9 (H) 81.1 - 91.4 %   Platelets 157 150 - 400 K/uL  Comprehensive metabolic panel     Status: Abnormal   Collection Time: 03/15/16  8:20 PM  Result Value Ref Range   Sodium 136 135 - 145 mmol/L   Potassium 3.6 3.5 - 5.1 mmol/L   Chloride 108 101 - 111 mmol/L   CO2 22 22 - 32 mmol/L   Glucose, Bld 72 65 - 99 mg/dL   BUN 7 6 - 20 mg/dL   Creatinine, Ser 7.82 0.44 - 1.00 mg/dL   Calcium 8.8 (L) 8.9 - 10.3 mg/dL   Total Protein 6.9 6.5 - 8.1 g/dL   Albumin 3.4 (L) 3.5 - 5.0 g/dL   AST 16 15 - 41 U/L   ALT 13 (L) 14 - 54 U/L   Alkaline Phosphatase 64 38 - 126 U/L   Total Bilirubin 0.2 (L) 0.3 - 1.2 mg/dL   GFR calc non Af Amer >60 >60 mL/min   GFR calc Af Amer >60 >60 mL/min   Anion gap 6 5 - 15  Glucose, capillary      Status: None   Collection Time: 03/15/16  8:28 PM  Result Value Ref Range   Glucose-Capillary 68 65 - 99 mg/dL  Glucose, capillary     Status: Abnormal   Collection Time: 03/15/16  9:34 PM  Result Value Ref Range   Glucose-Capillary 64 (  L) 65 - 99 mg/dL  Glucose, capillary     Status: Abnormal   Collection Time: 03/15/16 10:18 PM  Result Value Ref Range   Glucose-Capillary 211 (H) 65 - 99 mg/dL    MDM Category 1 fetal tracing, no contractions CBC, CMP IV LR bolus, 1 liter Zofran 8 mg IV Pepcid 20 mg IV Checked CBG d/t 250 glucose on u/a -- CBG 68, pt states once she hits 65 she drops fast. Given apple juice - pt able to keep down CBG rechecked after juice -- 64, pt states she feels like she's "low" -- IV fluids switched to D5LR CBG with D5LR, 211 Pt states symptoms are the same after IV fluids & medications -- able to keep down PO fluids -- no episodes of vomiting or diarrhea while in MAU Pt states she's mainly concerned about variation in blood sugars & how her BS drops even after eating  S/w Dr. Claiborne Billingsallahan - reviewed labs, assessment, & interventions. Ok to discharge home with glucose tabs. Pt should call office on Monday to discuss referral to endocrinology.  Pt agreeable with this plan; although she states she has glucose tabs at home so will not need rx today.   Assessment and Plan  A: 1. Vomiting of pregnancy, after 22 weeks, antepartum   2. Anemia affecting pregnancy in second trimester     P: Discharge home Continue at home meds as prescribed Advance diet as tolerated -- discussed juices & meal replacement shakes Continue to monitor blood sugar & use glucose tabs as needed for hypoglycemic episodes Call office on Monday for follow up Discussed reasons to return to MAU  Judeth HornErin Adayah Arocho 03/15/2016, 8:07 PM

## 2016-03-15 NOTE — Discharge Instructions (Signed)
Eating Plan for Hyperemesis Gravidarum °Severe cases of hyperemesis gravidarum can lead to dehydration and malnutrition. The hyperemesis eating plan is one way to lessen the symptoms of nausea and vomiting. It is often used with prescribed medicines to control your symptoms.  °WHAT CAN I DO TO RELIEVE MY SYMPTOMS? °Listen to your body. Everyone is different and has different preferences. Find what works best for you. Some of the following things may help: °· Eat and drink slowly. °· Eat 5-6 small meals daily instead of 3 large meals.   °· Eat crackers before you get out of bed in the morning.   °· Starchy foods are usually well tolerated (such as cereal, toast, bread, potatoes, pasta, rice, and pretzels).   °· Ginger may help with nausea. Add ¼ tsp ground ginger to hot tea or choose ginger tea.   °· Try drinking 100% fruit juice or an electrolyte drink. °· Continue to take your prenatal vitamins as directed by your health care provider. If you are having trouble taking your prenatal vitamins, talk with your health care provider about different options. °· Include at least 1 serving of protein with your meals and snacks (such as meats or poultry, beans, nuts, eggs, or yogurt). Try eating a protein-rich snack before bed (such as cheese and crackers or a half turkey or peanut butter sandwich). °WHAT THINGS SHOULD I AVOID TO REDUCE MY SYMPTOMS? °The following things may help reduce your symptoms: °· Avoid foods with strong smells. Try eating meals in well-ventilated areas that are free of odors. °· Avoid drinking water or other beverages with meals. Try not to drink anything less than 30 minutes before and after meals. °· Avoid drinking more than 1 cup of fluid at a time. °· Avoid fried or high-fat foods, such as butter and cream sauces. °· Avoid spicy foods. °· Avoid skipping meals the best you can. Nausea can be more intense on an empty stomach. If you cannot tolerate food at that time, do not force it. Try sucking on  ice chips or other frozen items and make up the calories later. °· Avoid lying down within 2 hours after eating. °  °This information is not intended to replace advice given to you by your health care provider. Make sure you discuss any questions you have with your health care provider. °  °Document Released: 06/22/2007 Document Revised: 08/30/2013 Document Reviewed: 06/29/2013 °Elsevier Interactive Patient Education ©2016 [employer] Inc[employer] ° °

## 2016-03-15 NOTE — MAU Note (Signed)
This is day 4 of vomiting and diarrhea. Unable to keep down anything. Tried reglan, diclegis, phenergan, and zofran. I was juvenile diabetic and then pancreas started working and taken off meds. Now during pregnancy on no meds. I know my sugar has been low these past few days. No vag bleeding but some watery vag d/c that was checked out few wks ago. Cont to have wet panties at times.

## 2016-03-25 ENCOUNTER — Inpatient Hospital Stay (HOSPITAL_COMMUNITY)
Admission: AD | Admit: 2016-03-25 | Discharge: 2016-03-28 | DRG: 781 | Disposition: A | Discharge status: Home / Self Care | Payer: 59 | Source: Ambulatory Visit | Attending: Obstetrics and Gynecology | Admitting: Obstetrics and Gynecology

## 2016-03-25 ENCOUNTER — Other Ambulatory Visit: Payer: Self-pay | Admitting: Obstetrics and Gynecology

## 2016-03-25 ENCOUNTER — Encounter (HOSPITAL_COMMUNITY): Payer: Self-pay | Admitting: *Deleted

## 2016-03-25 DIAGNOSIS — D509 Iron deficiency anemia, unspecified: Secondary | ICD-10-CM | POA: Diagnosis present

## 2016-03-25 DIAGNOSIS — E119 Type 2 diabetes mellitus without complications: Secondary | ICD-10-CM | POA: Diagnosis present

## 2016-03-25 DIAGNOSIS — O211 Hyperemesis gravidarum with metabolic disturbance: Secondary | ICD-10-CM | POA: Diagnosis present

## 2016-03-25 DIAGNOSIS — E282 Polycystic ovarian syndrome: Secondary | ICD-10-CM | POA: Diagnosis present

## 2016-03-25 DIAGNOSIS — Z3A28 28 weeks gestation of pregnancy: Secondary | ICD-10-CM

## 2016-03-25 DIAGNOSIS — O99013 Anemia complicating pregnancy, third trimester: Secondary | ICD-10-CM | POA: Diagnosis present

## 2016-03-25 DIAGNOSIS — O99283 Endocrine, nutritional and metabolic diseases complicating pregnancy, third trimester: Secondary | ICD-10-CM | POA: Diagnosis present

## 2016-03-25 DIAGNOSIS — D529 Folate deficiency anemia, unspecified: Secondary | ICD-10-CM | POA: Diagnosis present

## 2016-03-25 DIAGNOSIS — O10013 Pre-existing essential hypertension complicating pregnancy, third trimester: Secondary | ICD-10-CM | POA: Diagnosis present

## 2016-03-25 DIAGNOSIS — O210 Mild hyperemesis gravidarum: Secondary | ICD-10-CM | POA: Diagnosis present

## 2016-03-25 DIAGNOSIS — E11649 Type 2 diabetes mellitus with hypoglycemia without coma: Secondary | ICD-10-CM | POA: Diagnosis present

## 2016-03-25 DIAGNOSIS — O24113 Pre-existing diabetes mellitus, type 2, in pregnancy, third trimester: Secondary | ICD-10-CM | POA: Diagnosis present

## 2016-03-25 DIAGNOSIS — D510 Vitamin B12 deficiency anemia due to intrinsic factor deficiency: Secondary | ICD-10-CM | POA: Diagnosis present

## 2016-03-25 DIAGNOSIS — Z9884 Bariatric surgery status: Secondary | ICD-10-CM

## 2016-03-25 LAB — CBC
HCT: 28.7 % — ABNORMAL LOW (ref 36.0–46.0)
HEMOGLOBIN: 9.1 g/dL — AB (ref 12.0–15.0)
MCH: 27.5 pg (ref 26.0–34.0)
MCHC: 31.7 g/dL (ref 30.0–36.0)
MCV: 86.7 fL (ref 78.0–100.0)
Platelets: 156 10*3/uL (ref 150–400)
RBC: 3.31 MIL/uL — ABNORMAL LOW (ref 3.87–5.11)
RDW: 19.7 % — AB (ref 11.5–15.5)
WBC: 9.6 10*3/uL (ref 4.0–10.5)

## 2016-03-25 LAB — COMPREHENSIVE METABOLIC PANEL
ALK PHOS: 57 U/L (ref 38–126)
ALT: 11 U/L — ABNORMAL LOW (ref 14–54)
ANION GAP: 7 (ref 5–15)
AST: 13 U/L — ABNORMAL LOW (ref 15–41)
Albumin: 3.2 g/dL — ABNORMAL LOW (ref 3.5–5.0)
BILIRUBIN TOTAL: 0.4 mg/dL (ref 0.3–1.2)
BUN: 6 mg/dL (ref 6–20)
CALCIUM: 8.8 mg/dL — AB (ref 8.9–10.3)
CO2: 22 mmol/L (ref 22–32)
Chloride: 107 mmol/L (ref 101–111)
Creatinine, Ser: 0.51 mg/dL (ref 0.44–1.00)
GFR calc non Af Amer: >60 mL/min (ref >60)
Glucose, Bld: 90 mg/dL (ref 65–99)
POTASSIUM: 3.8 mmol/L (ref 3.5–5.1)
SODIUM: 136 mmol/L (ref 135–145)
TOTAL PROTEIN: 6.7 g/dL (ref 6.5–8.1)

## 2016-03-25 LAB — T4, FREE: FREE T4: 0.68 ng/dL (ref 0.61–1.12)

## 2016-03-25 LAB — GLUCOSE, CAPILLARY
GLUCOSE-CAPILLARY: 88 mg/dL (ref 65–99)
GLUCOSE-CAPILLARY: 77 mg/dL (ref 65–99)
GLUCOSE-CAPILLARY: 129 mg/dL — AB (ref 65–99)

## 2016-03-25 LAB — TSH: TSH: 0.993 u[IU]/mL (ref 0.350–4.500)

## 2016-03-25 MED ORDER — SERTRALINE HCL 50 MG PO TABS
50.0000 mg | ORAL_TABLET | Freq: Every day | ORAL | Status: DC
  Administered 2016-03-26 – 2016-03-28 (×3): 50 mg via ORAL
  Filled 2016-03-25 (×3): qty 1

## 2016-03-25 MED ORDER — DIPHENHYDRAMINE HCL 25 MG PO TABS
25.0000 mg | ORAL_TABLET | Freq: Four times a day (QID) | ORAL | Status: DC | PRN
  Filled 2016-03-25: qty 1

## 2016-03-25 MED ORDER — CALCIUM CARBONATE ANTACID 500 MG PO CHEW: 2.0000 | CHEWABLE_TABLET | ORAL | Status: DC | PRN

## 2016-03-25 MED ORDER — SODIUM CHLORIDE 0.9% FLUSH
3.0000 mL | INTRAVENOUS | Status: DC | PRN
  Administered 2016-03-26: 3 mL via INTRAVENOUS
  Filled 2016-03-25: qty 3

## 2016-03-25 MED ORDER — ACETAMINOPHEN 325 MG PO TABS: 650.0000 mg | ORAL_TABLET | ORAL | Status: DC | PRN

## 2016-03-25 MED ORDER — FAMOTIDINE IN NACL 20-0.9 MG/50ML-% IV SOLN
20.0000 mg | Freq: Two times a day (BID) | INTRAVENOUS | Status: DC
  Administered 2016-03-26 (×3): 20 mg via INTRAVENOUS
  Filled 2016-03-25 (×4): qty 50

## 2016-03-25 MED ORDER — SODIUM CHLORIDE 0.9 % IV SOLN: 250.0000 mL | INTRAVENOUS | Status: DC | PRN

## 2016-03-25 MED ORDER — ZOLPIDEM TARTRATE 5 MG PO TABS: 5.0000 mg | ORAL_TABLET | Freq: Every evening | ORAL | Status: DC | PRN

## 2016-03-25 MED ORDER — LACTATED RINGERS IV SOLN
INTRAVENOUS | Status: DC
  Administered 2016-03-25: 19:00:00 via INTRAVENOUS

## 2016-03-25 MED ORDER — PRENATAL MULTIVITAMIN CH
1.0000 | ORAL_TABLET | Freq: Every day | ORAL | Status: DC
  Administered 2016-03-27: 1 via ORAL
  Filled 2016-03-25: qty 1

## 2016-03-25 MED ORDER — SODIUM CHLORIDE 0.9% FLUSH
3.0000 mL | Freq: Two times a day (BID) | INTRAVENOUS | Status: DC
  Administered 2016-03-27: 3 mL via INTRAVENOUS

## 2016-03-25 MED ORDER — SODIUM CHLORIDE 0.9 % IV SOLN
25.0000 mg | INTRAVENOUS | Status: DC
Start: 2016-03-25 — End: 2016-03-26
  Administered 2016-03-25 – 2016-03-26 (×3): 25 mg via INTRAVENOUS
  Filled 2016-03-25 (×3): qty 1

## 2016-03-25 MED ORDER — SODIUM CHLORIDE 0.9 % IV SOLN
8.0000 mg | Freq: Three times a day (TID) | INTRAVENOUS | Status: DC | PRN
  Filled 2016-03-25: qty 4

## 2016-03-25 MED ORDER — DOCUSATE SODIUM 100 MG PO CAPS
100.0000 mg | ORAL_CAPSULE | Freq: Every day | ORAL | Status: DC
  Administered 2016-03-26 – 2016-03-28 (×3): 100 mg via ORAL
  Filled 2016-03-25 (×3): qty 1

## 2016-03-25 NOTE — Progress Notes (Signed)
Pt rec'd as a direct admission into Rm #[id number], accompanied by her husband. Pt states she checked her blood sugar while in the waiting area & it was 65. Peanut butter, graham crackers & apple juice given per pt request. [address] Dr Henderson CloudHorvath notified of pt arrival & above information - orders rec'd.

## 2016-03-25 NOTE — H&P (Signed)
30 y.o. [redacted]w[redacted]d  G3P0020 comes in c/o weeks of feeling bad with low sugars.  Pt has been in and out of MAU with c/o low sugars and feeling bad.  She has been told to just drink pepsi to keep her sugars up.  The pt is vomiting 3-4 times a day and is usually unable to keep food down especially if her sugars are lower.  She was diagnosed as diabetic as a teen and had been on multiple meds in past including insulin.  As she did better with her diet however, she was able to come off her medications before she became pregnant.  Since she has been pregnant she has had persistent vomiting and decreased appetite.  She is unable to take Zofran ODT and phenergan suppositories and will vomit the phenergan pills frequently.  She has not been drinking and eating things that would keep up her sugars but has been told to eat and drink these things.  She feels bad today at office.  She lost weight initially but in recent month gained 10 pounds.  Otherwise has good fetal movement and no bleeding.  Denies LOF.  Pt also has chronic anemia and has been getting iron infusions.  She is SS trait negative.  [place] today shows fetus with appropriate growth (70%ile, AFI 13).  Pt admitted for aggressive control of nausea and hydration and then diabetic diet teaching.    Past Medical History  Diagnosis Date  . Anemia, iron deficiency   . Type II or unspecified type diabetes mellitus without mention of complication, not stated as uncontrolled   . HTN (hypertension)   . PCOS (polycystic ovarian syndrome)     Past Surgical History  Procedure Laterality Date  . Tonsillectomy    . Laparoscopic gastric banding      OB History  Gravida Para Term Preterm AB SAB TAB Ectopic Multiple Living  0    # Outcome Date GA Lbr Len/2nd Weight Sex Delivery Anes PTL Lv  3 Current           2 TAB           1 TAB               Social History   Social History  . Marital Status: Single    Spouse Name: N/A  . Number of Children: N/A   . Years of Education: N/A   Occupational History  . SALES     [employer]   Social History Main Topics  . Smoking status: Never Smoker   . Smokeless tobacco: Never Used  . Alcohol Use: No  . Drug Use: No  . Sexual Activity:    Partners: Male    Birth Control/ Protection: None   Other Topics Concern  . Not on file   Social History Narrative   Regular Exercise-yes   Caffeine use: occasionaly               Aspirin; Latex; and Nsaids    Prenatal Transfer Tool  Maternal Diabetes: Yes:  Diabetes Type:  Pre-pregnancy- low sugars, no meds. Genetic Screening: Normal Maternal Ultrasounds/Referrals: Normal Fetal Ultrasounds or other Referrals:  None Maternal Substance Abuse:  No Significant Maternal Medications:  Meds include: Other: IV iron Significant Maternal Lab Results: Lab values include: Other: low H/H  See above for other PNC.    There were no vitals filed for this visit.  Lungs/Cor:  NAD Abdomen:  soft, gravid  Ex:  no cords, erythema SVE:  defered FHTs:Pending, 130 in office Toco:  pending   A/P   Type II diabetes, 2972w5d with persistent nausea and vomiting and low sugars.  1. Admit for IVF hydration and nausea control with IV phenergan.  Will add pepcid IV and GI cocktail if needed for acid control.  Will add Reglan if necessary. 2. Labs for electrolytes, CBC, thyroid function tests, A1C, Cpeptide and insulin level. 3. Will replete sugars with healthier fluids for now, ie juices instead of pepsi.  May need D5 if sugars stay in low 60s.  Once pt is able to tolerate more solid foods, will need nutritional teaching to help with low glycemic index foods and frequent small meals to keep sugars steady.   4. Anemia- pt receiving IV iron infusions regularly.     Kameryn Tisdel A

## 2016-03-26 LAB — GLUCOSE, CAPILLARY
GLUCOSE-CAPILLARY: 107 mg/dL — AB (ref 65–99)
GLUCOSE-CAPILLARY: 118 mg/dL — AB (ref 65–99)
GLUCOSE-CAPILLARY: 77 mg/dL (ref 65–99)
GLUCOSE-CAPILLARY: 83 mg/dL (ref 65–99)
Glucose-Capillary: 64 mg/dL — ABNORMAL LOW (ref 65–99)
Glucose-Capillary: 70 mg/dL (ref 65–99)
Glucose-Capillary: 75 mg/dL (ref 65–99)
Glucose-Capillary: 77 mg/dL (ref 65–99)
Glucose-Capillary: 115 mg/dL — ABNORMAL HIGH (ref 65–99)
Glucose-Capillary: 67 mg/dL (ref 65–99)
Glucose-Capillary: 55 mg/dL — ABNORMAL LOW (ref 65–99)
Glucose-Capillary: 91 mg/dL (ref 65–99)

## 2016-03-26 LAB — HEMOGLOBIN A1C
Hgb A1c MFr Bld: 5 % (ref 4.8–5.6)
Mean Plasma Glucose: 97 mg/dL

## 2016-03-26 LAB — T3, FREE: T3 FREE: 2.8 pg/mL (ref 2.0–4.4)

## 2016-03-26 MED ORDER — PROMETHAZINE HCL 25 MG/ML IJ SOLN
25.0000 mg | Freq: Four times a day (QID) | INTRAMUSCULAR | Status: DC
  Administered 2016-03-26 – 2016-03-27 (×3): 25 mg via INTRAVENOUS
  Filled 2016-03-26 (×3): qty 1

## 2016-03-26 MED ORDER — LACTATED RINGERS IV BOLUS (SEPSIS)
1000.0000 mL | Freq: Once | INTRAVENOUS | Status: AC
  Administered 2016-03-26: 1000 mL via INTRAVENOUS

## 2016-03-26 NOTE — Progress Notes (Signed)
30 y.o. [id number] 5432w6d HD#1 admitted for direct admit, uncontrol diabetes.   G3P0 @ 2032w6d admitted from the office for persistent nausea and vomiting of pregnancy and frequent episodes of hypoglycemia.  Her complicated medical history is as follows:  1. History of l/s roux-en-y gastric bypass in 2012.  Starting weight 285.  Pre-pregnancy weight was 213.   2. Anemia--combination of iron deficiency anemia, B12/folate deficiency.  Is being managed by hematology and receiving iron infusions/B12.  Most recent infusions 6/23 and 6/30, with planned f/u appt and labs 8/4.  Hemoglobin on admission was 9.1.   3. Type 2 DM:  Was previously on insulin and metformin.  Not on any meds prior to start of pregnancy.  Initial A1c was 5.7.  On admission, A1c 5.0   Anna Nguyen reports nausea/vomiting/hypoglycemia were not present prior to the start of pregnancy.  She reports nausea and vomiting started in the early first trimester and have persisted since that time.  Previously tried diclegis, reglan, zofran, phenergan.  Reports that her episodes of hypoglycemia started after the nausea and vomiting.  With the nausea and vomiting of pregnancy, she reports abandoning her previous diet.  Nausea is worsened with protein intake.  Eats a lot of simple carbs.  Reports that she eats mostly cereal, PB and J sandwiches and drinking soda/juice.  Episodes of hypoglycemia occur mostly 1-2 hrs after eating.  In review of her logs, 1 BG of 48 seen.  She reports feeling "low" when BG in mid to upper 60s.    Of note, current weigh is 234 lb, with a 10+ weight gain in the past 3 weeks.      BP 92/44 mmHg  Pulse 96  Temp(Src) 98.2 F (36.8 C) (Oral)  Resp 16  Ht 5' 8.5" (1.74 m)  Wt 106.142 kg (234 lb)  BMI 35.06 kg/m2   NAD Abd  Soft, gravid, nontender Ex SCDs FHTs  NST reactive, no decels Toco  quiet  Results for orders placed or performed during the hospital encounter of 03/25/16 (from the past 24 hour(s))  CBC      Status: Abnormal   Collection Time: 03/25/16  5:58 PM  Result Value Ref Range   WBC 9.6 4.0 - 10.5 K/uL   RBC 3.31 (L) 3.87 - 5.11 MIL/uL   Hemoglobin 9.1 (L) 12.0 - 15.0 g/dL   HCT 08.628.7 (L) 57.836.0 - 46.946.0 %   MCV 86.7 78.0 - 100.0 fL   MCH 27.5 26.0 - 34.0 pg   MCHC 31.7 30.0 - 36.0 g/dL   RDW 62.919.7 (H) 52.811.5 - 41.315.5 %   Platelets 156 150 - 400 K/uL  Comprehensive metabolic panel     Status: Abnormal   Collection Time: 03/25/16  5:58 PM  Result Value Ref Range   Sodium 136 135 - 145 mmol/L   Potassium 3.8 3.5 - 5.1 mmol/L   Chloride 107 101 - 111 mmol/L   CO2 22 22 - 32 mmol/L   Glucose, Bld 90 65 - 99 mg/dL   BUN 6 6 - 20 mg/dL   Creatinine, Ser 2.440.51 0.44 - 1.00 mg/dL   Calcium 8.8 (L) 8.9 - 10.3 mg/dL   Total Protein 6.7 6.5 - 8.1 g/dL   Albumin 3.2 (L) 3.5 - 5.0 g/dL   AST 13 (L) 15 - 41 U/L   ALT 11 (L) 14 - 54 U/L   Alkaline Phosphatase 57 38 - 126 U/L   Total Bilirubin 0.4 0.3 - 1.2 mg/dL   GFR calc non  Af Amer >60 >60 mL/min   GFR calc Af Amer >60 >60 mL/min   Anion gap 7 5 - 15  TSH     Status: None   Collection Time: 03/25/16  5:58 PM  Result Value Ref Range   TSH 0.993 0.350 - 4.500 uIU/mL  Hemoglobin A1c     Status: None   Collection Time: 03/25/16  5:58 PM  Result Value Ref Range   Hgb A1c MFr Bld 5.0 4.8 - 5.6 %   Mean Plasma Glucose 97 mg/dL  T4, free     Status: None   Collection Time: 03/25/16  5:58 PM  Result Value Ref Range   Free T4 0.68 0.61 - 1.12 ng/dL  T3, free     Status: None   Collection Time: 03/25/16  5:58 PM  Result Value Ref Range   T3, Free 2.8 2.0 - 4.4 pg/mL  Glucose, capillary     Status: None   Collection Time: 03/25/16  6:07 PM  Result Value Ref Range   Glucose-Capillary 88 65 - 99 mg/dL  Glucose, capillary     Status: None   Collection Time: 03/25/16  8:07 PM  Result Value Ref Range   Glucose-Capillary 77 65 - 99 mg/dL  Glucose, capillary     Status: Abnormal   Collection Time: 03/25/16 10:26 PM  Result Value Ref Range    Glucose-Capillary 129 (H) 65 - 99 mg/dL  Glucose, capillary     Status: None   Collection Time: 03/26/16 12:16 AM  Result Value Ref Range   Glucose-Capillary 83 65 - 99 mg/dL  Glucose, capillary     Status: Abnormal   Collection Time: 03/26/16  1:00 AM  Result Value Ref Range   Glucose-Capillary 64 (L) 65 - 99 mg/dL  Glucose, capillary     Status: None   Collection Time: 03/26/16  1:26 AM  Result Value Ref Range   Glucose-Capillary 77 65 - 99 mg/dL  Glucose, capillary     Status: Abnormal   Collection Time: 03/26/16  2:49 AM  Result Value Ref Range   Glucose-Capillary 107 (H) 65 - 99 mg/dL  Glucose, capillary     Status: None   Collection Time: 03/26/16  4:34 AM  Result Value Ref Range   Glucose-Capillary 70 65 - 99 mg/dL  Glucose, capillary     Status: None   Collection Time: 03/26/16  6:27 AM  Result Value Ref Range   Glucose-Capillary 75 65 - 99 mg/dL  Glucose, capillary     Status: None   Collection Time: 03/26/16  8:03 AM  Result Value Ref Range   Glucose-Capillary 77 65 - 99 mg/dL   Comment 1 Notify RN    Comment 2 Document in Chart   Glucose, capillary     Status: Abnormal   Collection Time: 03/26/16 10:02 AM  Result Value Ref Range   Glucose-Capillary 118 (H) 65 - 99 mg/dL   Comment 1 Notify RN    Comment 2 Document in Chart     A:  HD#1  [redacted]w[redacted]d with nausea and vomiting of pregnancy, type 2 DM and episodes of hypoglycemia.  P: Scheduled IV antiemetics with IVF today.  If nausea improves today, will d/c IVF tomorrow and transition to PO scheduled antiemetics Cont BID scheduled pepcid, transition to PPI as needed Nutrition consult today Cont BG monitoring.    [facility] GEFFEL [employer]

## 2016-03-26 NOTE — Progress Notes (Signed)
No emesis today.  Did not feel symptomatic w 67 (postprandial lunch).  Will d/c IVF, switch to scheduled IV phenergan and pepcid with IV zofran prn for breakthrough n/v.  If she does well on this regimen, will transition to scheduled PO meds tomorrow.    Seen by nutrition today--see note.

## 2016-03-26 NOTE — Progress Notes (Signed)
   Nutrition Dx: Food and nutrition-related knowledge deficit r/t limited comprehension of GDM diet aeb pt report.  Nutrition education consult for Carbohydrate Modified Gestational Diabetic Diet completed.  "Meal  plan for gestational diabetics" handout given to patient.  Pt with history of n/v since [redacted] weeks pregnant. Issues with low serum glucose levels, etiology may be nausea/ vomiting as well as inconsistent meal schedule and food choices that are high glycemic index. Because of nausea many food options do not appeal to her when it come time to eat.  Topics stressed to pt: Elimination of high sugar beverages/ foods that cause spike in serum glucose and then quick drop. If BS drops very low requiring rescue with juice, wait 1/2 hour then eat a snack with CHO and protein Follow parameters of GDM diet, 3 meals plus 3 snacks, no more than 2-3 hours between each meal and snack, protein portions at each meal and snack Allowed 1/2 banana at breakfast as it helps with her nausea Nausea can be managed better with small frequent meals, and stable BS  CHO count was increased on the GDM diet to allow pt more menus options, as she is eating only a portion of what is on her tray and does not like the food when it comes to her room.  Pt has not been receiving her snacks - allowed pt to order snacks if none were prepared by Dietary.  Pt has the perception that she has received inaccurate information about GDM diet parameters from Service Response/ menu line.   Reinforced GDM diet parameters. Questions answered.  Patient verbalizes understanding.  Anna Nguyen M.Odis LusterEd. R.D. LDN Neonatal Nutrition Support Specialist/RD III Pager [phone]      Phone [phone]

## 2016-03-27 LAB — GLUCOSE, CAPILLARY
GLUCOSE-CAPILLARY: 70 mg/dL (ref 65–99)
GLUCOSE-CAPILLARY: 63 mg/dL — AB (ref 65–99)
GLUCOSE-CAPILLARY: 82 mg/dL (ref 65–99)
GLUCOSE-CAPILLARY: 99 mg/dL (ref 65–99)
GLUCOSE-CAPILLARY: 41 mg/dL — AB (ref 65–99)
GLUCOSE-CAPILLARY: 42 mg/dL — AB (ref 65–99)
GLUCOSE-CAPILLARY: 67 mg/dL (ref 65–99)
Glucose-Capillary: 62 mg/dL — ABNORMAL LOW (ref 65–99)
Glucose-Capillary: 98 mg/dL (ref 65–99)

## 2016-03-27 LAB — C-PEPTIDE: C-Peptide: 3.2 ng/mL (ref 1.1–4.4)

## 2016-03-27 MED ORDER — ONDANSETRON HCL 4 MG PO TABS: 8.0000 mg | ORAL_TABLET | Freq: Three times a day (TID) | ORAL | Status: DC

## 2016-03-27 MED ORDER — FAMOTIDINE 20 MG PO TABS
20.0000 mg | ORAL_TABLET | Freq: Two times a day (BID) | ORAL | Status: DC
  Administered 2016-03-27 – 2016-03-28 (×3): 20 mg via ORAL
  Filled 2016-03-27 (×3): qty 1

## 2016-03-27 MED ORDER — ONDANSETRON HCL 4 MG PO TABS: 8.0000 mg | ORAL_TABLET | Freq: Three times a day (TID) | ORAL | Status: DC | PRN

## 2016-03-27 MED ORDER — PROMETHAZINE HCL 25 MG PO TABS
25.0000 mg | ORAL_TABLET | Freq: Four times a day (QID) | ORAL | Status: DC
  Administered 2016-03-27 – 2016-03-28 (×4): 25 mg via ORAL
  Filled 2016-03-27 (×4): qty 1

## 2016-03-27 NOTE — Progress Notes (Signed)
BS recheck 67 and eating dinner

## 2016-03-27 NOTE — Progress Notes (Signed)
Hypoglycemic Event  CBG: 42  Treatment: Apple juice & graham crackers 15 GM carbohydrate snack  Symptoms:nausea nausea  Follow-up CBG: Time2104 CBG Result: <41  Possible Reasons for Event: uncontrolled DM Inadequate meal intake  Comments/MD notified: MD notified    Curt JewsBarnes, Zacharee Gaddie T

## 2016-03-27 NOTE — Progress Notes (Signed)
30 y.o. [id number] 2251w0d HD#2 admitted for direct admit, uncontrol diabetes.  Hypoglycemia instead of hyper.   Pt currently stable with no c/o vomiting today.  Good FM.  Filed Vitals:   03/26/16 0932 03/26/16 1144 03/26/16 2020 03/27/16 0016  BP: 92/44 105/54 110/66 124/71  Pulse: 96 101 106 97  Temp: 98.2 F (36.8 C) 98.2 F (36.8 C) 98.1 F (36.7 C) 98.1 F (36.7 C)  TempSrc: Oral Oral    Resp: 16 16 20 18   Height:      Weight:        Lungs CTA Cor RRR Abd  Soft, gravid, nontender Ex SCDs FHTs  130s, good short term variability, NST R Toco  none  Results for orders placed or performed during the hospital encounter of 03/25/16 (from the past 24 hour(s))  Glucose, capillary     Status: None   Collection Time: 03/26/16  8:03 AM  Result Value Ref Range   Glucose-Capillary 77 65 - 99 mg/dL   Comment 1 Notify RN    Comment 2 Document in Chart   Glucose, capillary     Status: Abnormal   Collection Time: 03/26/16 10:02 AM  Result Value Ref Range   Glucose-Capillary 118 (H) 65 - 99 mg/dL   Comment 1 Notify RN    Comment 2 Document in Chart   Glucose, capillary     Status: Abnormal   Collection Time: 03/26/16 12:49 PM  Result Value Ref Range   Glucose-Capillary 115 (H) 65 - 99 mg/dL   Comment 1 Notify RN    Comment 2 Document in Chart   Glucose, capillary     Status: None   Collection Time: 03/26/16  2:06 PM  Result Value Ref Range   Glucose-Capillary 67 65 - 99 mg/dL  Glucose, capillary     Status: Abnormal   Collection Time: 03/26/16  6:03 PM  Result Value Ref Range   Glucose-Capillary 55 (L) 65 - 99 mg/dL  Glucose, capillary     Status: None   Collection Time: 03/26/16  8:22 PM  Result Value Ref Range   Glucose-Capillary 91 65 - 99 mg/dL  Glucose, capillary     Status: None   Collection Time: 03/27/16  6:54 AM  Result Value Ref Range   Glucose-Capillary 70 65 - 99 mg/dL    A:  HD#2  5951w0d with hx of diabetes but now probable hypoglycemia secondary hyperemesis  gravidarum..  P: 1.  Change to PO meds and keep scheduled. 2.  Continue diabetic nutrition teaching.  3.  No medications needed for sugars and A1C was 5.0.   4.  Thyroid is normal.  5.  Anemia- stable on iron infusions.   Quinlin Conant A

## 2016-03-27 NOTE — Progress Notes (Signed)
MD notified of BS <41. Pt alert and oriented, nausea. Per MD continue apple juice recheck in if still low restart VI

## 2016-03-28 LAB — GLUCOSE, CAPILLARY
GLUCOSE-CAPILLARY: 71 mg/dL (ref 65–99)
GLUCOSE-CAPILLARY: 72 mg/dL (ref 65–99)

## 2016-03-28 MED ORDER — ONDANSETRON HCL 8 MG PO TABS: 4.0000 mg | ORAL_TABLET | Freq: Three times a day (TID) | ORAL | Status: DC | PRN

## 2016-03-28 MED ORDER — PROMETHAZINE HCL 25 MG PO TABS: 50.0000 mg | ORAL_TABLET | Freq: Four times a day (QID) | ORAL | Status: DC | PRN

## 2016-03-28 NOTE — Progress Notes (Signed)
Discharge instructions given, questions answered, states understanding, signed , given copy.

## 2016-03-28 NOTE — Discharge Summary (Signed)
Obstetric Discharge Summary Reason for Admission: Hyperemesis Prenatal Procedures: NST Intrapartum Procedures: NA undelivered Postpartum Procedures: NA undelivered Complications-Operative and Postpartum: NA undelivered HEMOGLOBIN  Date Value Ref Range Status  03/25/2016 9.1* 12.0 - 15.0 g/dL Final   HGB  Date Value Ref Range Status  01/16/2016 8.2* 11.6 - 15.9 g/dL Final   HCT  Date Value Ref Range Status  03/25/2016 28.7* 36.0 - 46.0 % Final  01/16/2016 25.7* 34.8 - 46.6 % Final    Physical Exam:  General: alert, cooperative and appears stated age Reactive NST   Discharge Diagnoses: 29 week IUP with Hyperemesis Gravidarum  Discharge Information: Date: 03/28/2016 Activity: pelvic rest Diet: routine and Carb modified Medications: promethazine and zofran Condition: stable Instructions: refer to practice specific booklet Discharge to: home Follow-up Information    Follow up with PINN, Sanjuana MaeWALDA STACIA, MD In 1 week.   Specialty:  Obstetrics and Gynecology   Why:  For a prenatal appointment   Contact information:   [address] [place] [address] [phone]       Almon HerculesOSS,Carles Florea H. 03/28/2016, 10:52 AM

## 2016-04-11 ENCOUNTER — Other Ambulatory Visit: Payer: 59

## 2016-04-11 ENCOUNTER — Telehealth: Payer: Self-pay | Admitting: Hematology and Oncology

## 2016-04-11 NOTE — Telephone Encounter (Signed)
pt cld to r/s appt to 8/7@1 :30

## 2016-04-14 ENCOUNTER — Telehealth: Payer: Self-pay | Admitting: *Deleted

## 2016-04-14 ENCOUNTER — Other Ambulatory Visit (HOSPITAL_BASED_OUTPATIENT_CLINIC_OR_DEPARTMENT_OTHER): Payer: 59

## 2016-04-14 DIAGNOSIS — D509 Iron deficiency anemia, unspecified: Secondary | ICD-10-CM | POA: Diagnosis not present

## 2016-04-14 DIAGNOSIS — D519 Vitamin B12 deficiency anemia, unspecified: Secondary | ICD-10-CM

## 2016-04-14 LAB — CBC & DIFF AND RETIC
BASO%: 0.1 % (ref 0.0–2.0)
BASOS ABS: 0 10*3/uL (ref 0.0–0.1)
EOS ABS: 0 10*3/uL (ref 0.0–0.5)
EOS%: 0.5 % (ref 0.0–7.0)
HEMATOCRIT: 30.4 % — AB (ref 34.8–46.6)
HEMOGLOBIN: 10.1 g/dL — AB (ref 11.6–15.9)
Immature Retic Fract: 4.1 % (ref 1.60–10.00)
LYMPH#: 2 10*3/uL (ref 0.9–3.3)
LYMPH%: 25.3 % (ref 14.0–49.7)
MCH: 28.5 pg (ref 25.1–34.0)
MCHC: 33.2 g/dL (ref 31.5–36.0)
MCV: 85.9 fL (ref 79.5–101.0)
MONO#: 0.7 10*3/uL (ref 0.1–0.9)
MONO%: 8.4 % (ref 0.0–14.0)
NEUT#: 5.1 10*3/uL (ref 1.5–6.5)
NEUT%: 65.7 % (ref 38.4–76.8)
PLATELETS: 141 10*3/uL — AB (ref 145–400)
RBC: 3.54 10*6/uL — ABNORMAL LOW (ref 3.70–5.45)
RDW: 16.2 % — ABNORMAL HIGH (ref 11.2–14.5)
RETIC %: 1.25 % (ref 0.70–2.10)
RETIC CT ABS: 44.25 10*3/uL (ref 33.70–90.70)
WBC: 7.7 10*3/uL (ref 3.9–10.3)

## 2016-04-14 NOTE — Telephone Encounter (Signed)
Pt called, missed her lab appt this afternoon,  Very apologetic and asks if she can still come in?   I called Lab and Melissa said pt can still come.  Informed pt she can come now and she says she is on her way.

## 2016-04-15 ENCOUNTER — Telehealth: Payer: Self-pay | Admitting: *Deleted

## 2016-04-15 ENCOUNTER — Telehealth: Payer: Self-pay | Admitting: Hematology and Oncology

## 2016-04-15 ENCOUNTER — Other Ambulatory Visit: Payer: Self-pay | Admitting: Hematology and Oncology

## 2016-04-15 ENCOUNTER — Ambulatory Visit (HOSPITAL_BASED_OUTPATIENT_CLINIC_OR_DEPARTMENT_OTHER): Payer: 59

## 2016-04-15 VITALS — BP 117/62 | HR 99 | Temp 98.5°F | Resp 20

## 2016-04-15 DIAGNOSIS — D509 Iron deficiency anemia, unspecified: Secondary | ICD-10-CM

## 2016-04-15 DIAGNOSIS — D518 Other vitamin B12 deficiency anemias: Secondary | ICD-10-CM | POA: Diagnosis not present

## 2016-04-15 DIAGNOSIS — D519 Vitamin B12 deficiency anemia, unspecified: Secondary | ICD-10-CM

## 2016-04-15 LAB — FERRITIN: Ferritin: 168 ng/ml (ref 9–269)

## 2016-04-15 LAB — VITAMIN B12: VITAMIN B 12: 116 pg/mL — AB (ref 211–946)

## 2016-04-15 MED ORDER — CYANOCOBALAMIN 1000 MCG/ML IJ SOLN
1000.0000 ug | Freq: Once | INTRAMUSCULAR | Status: AC
  Administered 2016-04-15: 1000 ug via INTRAMUSCULAR

## 2016-04-15 NOTE — Patient Instructions (Signed)

## 2016-04-15 NOTE — Telephone Encounter (Signed)
I placed urgent POF

## 2016-04-15 NOTE — Telephone Encounter (Signed)
sched pt inj for today pt aware per pof

## 2016-04-15 NOTE — Telephone Encounter (Signed)
-----   Message from Artis DelayNi Gorsuch, MD sent at 04/15/2016  9:23 AM EDT ----- Regarding: labs Her labs are better except her B12 level is still very low She would need B12 injection every week until I see her back again next month Can you call her and ask what days work best for her to come in and then place POF for inj appt weekly? Thanks ----- Message ----- From: Interface, Lab In Three Zero One Sent: 04/14/2016   3:20 PM To: Artis DelayNi Gorsuch, MD

## 2016-04-15 NOTE — Telephone Encounter (Signed)
Informed pt of Dr. Maxine GlennGorsuch's note.  She can come in late today for injection at 4 pm..

## 2016-04-22 ENCOUNTER — Ambulatory Visit (HOSPITAL_BASED_OUTPATIENT_CLINIC_OR_DEPARTMENT_OTHER): Payer: 59

## 2016-04-22 VITALS — BP 114/69 | HR 106 | Temp 98.4°F | Resp 20

## 2016-04-22 DIAGNOSIS — D518 Other vitamin B12 deficiency anemias: Secondary | ICD-10-CM | POA: Diagnosis not present

## 2016-04-22 DIAGNOSIS — D509 Iron deficiency anemia, unspecified: Secondary | ICD-10-CM

## 2016-04-22 DIAGNOSIS — D519 Vitamin B12 deficiency anemia, unspecified: Secondary | ICD-10-CM

## 2016-04-22 MED ORDER — CYANOCOBALAMIN 1000 MCG/ML IJ SOLN
1000.0000 ug | Freq: Once | INTRAMUSCULAR | Status: AC
  Administered 2016-04-22: 1000 ug via INTRAMUSCULAR

## 2016-04-22 NOTE — Patient Instructions (Signed)

## 2016-04-29 ENCOUNTER — Ambulatory Visit (HOSPITAL_BASED_OUTPATIENT_CLINIC_OR_DEPARTMENT_OTHER): Payer: 59

## 2016-04-29 VITALS — BP 117/69 | HR 105 | Temp 98.3°F | Resp 18

## 2016-04-29 DIAGNOSIS — D509 Iron deficiency anemia, unspecified: Secondary | ICD-10-CM

## 2016-04-29 DIAGNOSIS — D518 Other vitamin B12 deficiency anemias: Secondary | ICD-10-CM | POA: Diagnosis not present

## 2016-04-29 DIAGNOSIS — D519 Vitamin B12 deficiency anemia, unspecified: Secondary | ICD-10-CM

## 2016-04-29 MED ORDER — CYANOCOBALAMIN 1000 MCG/ML IJ SOLN
1000.0000 ug | Freq: Once | INTRAMUSCULAR | Status: AC
  Administered 2016-04-29: 1000 ug via INTRAMUSCULAR

## 2016-04-29 NOTE — Patient Instructions (Signed)

## 2016-04-30 ENCOUNTER — Encounter (HOSPITAL_COMMUNITY): Payer: Self-pay

## 2016-04-30 ENCOUNTER — Inpatient Hospital Stay (HOSPITAL_COMMUNITY)
Admission: AD | Admit: 2016-04-30 | Discharge: 2016-04-30 | Disposition: A | Discharge status: Home / Self Care | Payer: 59 | Source: Ambulatory Visit | Attending: Obstetrics and Gynecology | Admitting: Obstetrics and Gynecology

## 2016-04-30 DIAGNOSIS — O99283 Endocrine, nutritional and metabolic diseases complicating pregnancy, third trimester: Secondary | ICD-10-CM | POA: Insufficient documentation

## 2016-04-30 DIAGNOSIS — O26893 Other specified pregnancy related conditions, third trimester: Secondary | ICD-10-CM | POA: Diagnosis not present

## 2016-04-30 DIAGNOSIS — O210 Mild hyperemesis gravidarum: Secondary | ICD-10-CM | POA: Diagnosis not present

## 2016-04-30 DIAGNOSIS — O99013 Anemia complicating pregnancy, third trimester: Secondary | ICD-10-CM | POA: Insufficient documentation

## 2016-04-30 DIAGNOSIS — Z3A33 33 weeks gestation of pregnancy: Secondary | ICD-10-CM | POA: Insufficient documentation

## 2016-04-30 DIAGNOSIS — E282 Polycystic ovarian syndrome: Secondary | ICD-10-CM | POA: Insufficient documentation

## 2016-04-30 DIAGNOSIS — R197 Diarrhea, unspecified: Secondary | ICD-10-CM

## 2016-04-30 DIAGNOSIS — O9989 Other specified diseases and conditions complicating pregnancy, childbirth and the puerperium: Secondary | ICD-10-CM | POA: Insufficient documentation

## 2016-04-30 DIAGNOSIS — Z79899 Other long term (current) drug therapy: Secondary | ICD-10-CM | POA: Diagnosis not present

## 2016-04-30 DIAGNOSIS — D509 Iron deficiency anemia, unspecified: Secondary | ICD-10-CM | POA: Diagnosis not present

## 2016-04-30 DIAGNOSIS — O24913 Unspecified diabetes mellitus in pregnancy, third trimester: Secondary | ICD-10-CM | POA: Insufficient documentation

## 2016-04-30 DIAGNOSIS — O163 Unspecified maternal hypertension, third trimester: Secondary | ICD-10-CM | POA: Insufficient documentation

## 2016-04-30 DIAGNOSIS — R109 Unspecified abdominal pain: Secondary | ICD-10-CM | POA: Insufficient documentation

## 2016-04-30 LAB — COMPREHENSIVE METABOLIC PANEL
ALK PHOS: 104 U/L (ref 38–126)
ALT: 7 U/L — AB (ref 14–54)
AST: 13 U/L — AB (ref 15–41)
Albumin: 3.4 g/dL — ABNORMAL LOW (ref 3.5–5.0)
Anion gap: 8 (ref 5–15)
BILIRUBIN TOTAL: 0.3 mg/dL (ref 0.3–1.2)
CALCIUM: 9.2 mg/dL (ref 8.9–10.3)
CO2: 20 mmol/L — ABNORMAL LOW (ref 22–32)
CREATININE: 0.43 mg/dL — AB (ref 0.44–1.00)
Chloride: 108 mmol/L (ref 101–111)
Glucose, Bld: 70 mg/dL (ref 65–99)
Potassium: 3.6 mmol/L (ref 3.5–5.1)
Sodium: 136 mmol/L (ref 135–145)
TOTAL PROTEIN: 7.2 g/dL (ref 6.5–8.1)

## 2016-04-30 LAB — URINALYSIS, ROUTINE W REFLEX MICROSCOPIC
BILIRUBIN URINE: NEGATIVE
GLUCOSE, UA: NEGATIVE mg/dL
HGB URINE DIPSTICK: NEGATIVE
KETONES UR: NEGATIVE mg/dL
NITRITE: NEGATIVE
PH: 7 (ref 5.0–8.0)
Protein, ur: NEGATIVE mg/dL
Specific Gravity, Urine: 1.01 (ref 1.005–1.030)

## 2016-04-30 LAB — CBC
HEMATOCRIT: 31.4 % — AB (ref 36.0–46.0)
HEMOGLOBIN: 10.3 g/dL — AB (ref 12.0–15.0)
MCH: 28.1 pg (ref 26.0–34.0)
MCHC: 32.8 g/dL (ref 30.0–36.0)
MCV: 85.8 fL (ref 78.0–100.0)
Platelets: 151 10*3/uL (ref 150–400)
RBC: 3.66 MIL/uL — AB (ref 3.87–5.11)
RDW: 14.7 % (ref 11.5–15.5)
WBC: 8.4 10*3/uL (ref 4.0–10.5)

## 2016-04-30 LAB — URINE MICROSCOPIC-ADD ON: RBC / HPF: NONE SEEN RBC/hpf (ref 0–5)

## 2016-04-30 MED ORDER — ONDANSETRON HCL 4 MG/2ML IJ SOLN
4.0000 mg | Freq: Once | INTRAMUSCULAR | Status: AC
  Administered 2016-04-30: 4 mg via INTRAVENOUS
  Filled 2016-04-30: qty 2

## 2016-04-30 MED ORDER — LACTATED RINGERS IV BOLUS (SEPSIS)
1000.0000 mL | Freq: Once | INTRAVENOUS | Status: AC
  Administered 2016-04-30: 1000 mL via INTRAVENOUS

## 2016-04-30 MED ORDER — PROMETHAZINE HCL 25 MG/ML IJ SOLN
25.0000 mg | Freq: Once | INTRAMUSCULAR | Status: AC
  Administered 2016-04-30: 25 mg via INTRAVENOUS
  Filled 2016-04-30: qty 1

## 2016-04-30 NOTE — MAU Provider Note (Signed)
Chief Complaint:  Emesis During Pregnancy; Diarrhea; and Abdominal Cramping   First Provider Initiated Contact with Patient 04/30/16 1834      HPI: Anna DanielsCharisse R Braxton is a 30 y.o. G3P0020 at 2537w6d who presents to maternity admissions reporting worsening emesis in pregnancy and onset of abdominal cramping and diarrhea, all symptoms starting 3 days ago. She has hx of hyperemesis in pregnancy, not well controlled on medications, but does keep down some food and fluids daily usually. Three days ago she started vomiting more frequently and is now unable to keep down even water.  She has tried PO phenergan and Zofran but did not keep it down. She reports 10+ episodes of diarrhea in last 24 hours.  This is not the first time her symptoms have occurred, as she reports this is a cycle for her and occurs off and on when she is pregnant.  She has not tried any treatments for the abdominal cramping, which is intermittent, and associated with back pain. She reports good fetal movement, denies LOF, vaginal bleeding, vaginal itching/burning, urinary symptoms, h/a, dizziness, or fever/chills.    HPI  Past Medical History: Past Medical History:  Diagnosis Date  . Anemia, iron deficiency   . HTN (hypertension)   . PCOS (polycystic ovarian syndrome)   . Type II or unspecified type diabetes mellitus without mention of complication, not stated as uncontrolled     Past obstetric history: OB History  Gravida Para Term Preterm AB Living  3       2 0  SAB TAB Ectopic Multiple Live Births    2          # Outcome Date GA Lbr Len/2nd Weight Sex Delivery Anes PTL Lv  3 Current           2 TAB           1 TAB               Past Surgical History: Past Surgical History:  Procedure Laterality Date  . LAPAROSCOPIC GASTRIC BANDING    . TONSILLECTOMY      Family History: Family History  Problem Relation Age of Onset  . Arthritis Other   . Diabetes Other   . Hypertension Other   . Hyperlipidemia Other   .  Prostate cancer Other   . Cancer Maternal Aunt     breast    Social History: Social History  Substance Use Topics  . Smoking status: Never Smoker  . Smokeless tobacco: Never Used  . Alcohol use No    Allergies:  Allergies  Allergen Reactions  . Aspirin     Unknown, Per surgeon  . Nsaids     Unknown, Per surgeon.  . Latex Rash    Meds:  Prescriptions Prior to Admission  Medication Sig Dispense Refill Last Dose  . ondansetron (ZOFRAN) 8 MG tablet Take 0.5 tablets (4 mg total) by mouth every 8 (eight) hours as needed for nausea or vomiting. 30 tablet 3 04/29/2016 at Unknown time  . promethazine (PHENERGAN) 25 MG tablet Take 2 tablets (50 mg total) by mouth every 6 (six) hours as needed for nausea or vomiting. 30 tablet 3 04/30/2016 at Unknown time  . ranitidine (ZANTAC) 150 MG tablet Take 150 mg by mouth 2 (two) times daily.   04/30/2016 at Unknown time  . sertraline (ZOLOFT) 50 MG tablet Take 50 mg by mouth daily.   3 04/30/2016 at Unknown time    ROS:  Review of Systems  Constitutional:  Negative for chills, fatigue and fever.  Eyes: Negative for visual disturbance.  Respiratory: Negative for shortness of breath.   Cardiovascular: Negative for chest pain.  Gastrointestinal: Positive for abdominal pain, diarrhea, nausea and vomiting.  Genitourinary: Negative for difficulty urinating, dysuria, flank pain, pelvic pain, vaginal bleeding, vaginal discharge and vaginal pain.  Musculoskeletal: Positive for back pain.  Neurological: Negative for dizziness and headaches.  Psychiatric/Behavioral: Negative.      I have reviewed patient's Past Medical Hx, Surgical Hx, Family Hx, Social Hx, medications and allergies.   Physical Exam   Patient Vitals for the past 24 hrs:  BP Temp Temp src Pulse Resp  04/30/16 1631 119/63 98.2 F (36.8 C) Oral 97 16   Constitutional: Well-developed, well-nourished female in no acute distress.  Cardiovascular: normal rate Respiratory: normal  effort GI: Abd soft, non-tender, gravid appropriate for gestational age.  MS: Extremities nontender, no edema, normal ROM Neurologic: Alert and oriented x 4.  GU: Neg CVAT.  PELVIC EXAM: Cervix pink, visually closed, without lesion, scant white creamy discharge, vaginal walls and external genitalia normal Bimanual exam: Cervix 0/long/high, firm, anterior, neg CMT, uterus nontender, nonenlarged, adnexa without tenderness, enlargement, or mass     FHT:  Baseline 135, moderate variability, accelerations present, no decelerations Contractions: some irritability prior to IV fluids, none following fluids   Labs: Results for orders placed or performed during the hospital encounter of 04/30/16 (from the past 24 hour(s))  Urinalysis, Routine w reflex microscopic (not at [facility])     Status: Abnormal   Collection Time: 04/30/16  4:35 PM  Result Value Ref Range   Color, Urine YELLOW YELLOW   APPearance CLEAR CLEAR   Specific Gravity, Urine 1.010 1.005 - 1.030   pH 7.0 5.0 - 8.0   Glucose, UA NEGATIVE NEGATIVE mg/dL   Hgb urine dipstick NEGATIVE NEGATIVE   Bilirubin Urine NEGATIVE NEGATIVE   Ketones, ur NEGATIVE NEGATIVE mg/dL   Protein, ur NEGATIVE NEGATIVE mg/dL   Nitrite NEGATIVE NEGATIVE   Leukocytes, UA MODERATE (A) NEGATIVE  Urine microscopic-add on     Status: Abnormal   Collection Time: 04/30/16  4:35 PM  Result Value Ref Range   Squamous Epithelial / LPF 6-30 (A) NONE SEEN   WBC, UA 6-30 0 - 5 WBC/hpf   RBC / HPF NONE SEEN 0 - 5 RBC/hpf   Bacteria, UA RARE (A) NONE SEEN  CBC     Status: Abnormal   Collection Time: 04/30/16  6:30 PM  Result Value Ref Range   WBC 8.4 4.0 - 10.5 K/uL   RBC 3.66 (L) 3.87 - 5.11 MIL/uL   Hemoglobin 10.3 (L) 12.0 - 15.0 g/dL   HCT 40.931.4 (L) 81.136.0 - 91.446.0 %   MCV 85.8 78.0 - 100.0 fL   MCH 28.1 26.0 - 34.0 pg   MCHC 32.8 30.0 - 36.0 g/dL   RDW 78.214.7 95.611.5 - 21.315.5 %   Platelets 151 150 - 400 K/uL  Comprehensive metabolic panel     Status: Abnormal    Collection Time: 04/30/16  6:30 PM  Result Value Ref Range   Sodium 136 135 - 145 mmol/L   Potassium 3.6 3.5 - 5.1 mmol/L   Chloride 108 101 - 111 mmol/L   CO2 20 (L) 22 - 32 mmol/L   Glucose, Bld 70 65 - 99 mg/dL   BUN <5 (L) 6 - 20 mg/dL   Creatinine, Ser 0.860.43 (L) 0.44 - 1.00 mg/dL   Calcium 9.2 8.9 - 57.810.3 mg/dL  Total Protein 7.2 6.5 - 8.1 g/dL   Albumin 3.4 (L) 3.5 - 5.0 g/dL   AST 13 (L) 15 - 41 U/L   ALT 7 (L) 14 - 54 U/L   Alkaline Phosphatase 104 38 - 126 U/L   Total Bilirubin 0.3 0.3 - 1.2 mg/dL   GFR calc non Af Amer >60 >60 mL/min   GFR calc Af Amer >60 >60 mL/min   Anion gap 8 5 - 15      Imaging:  No results found.  MAU Course/MDM: I have ordered labs and reviewed results.  Consult Dr Tenny Craw.  Treatments in MAU included LR x 1000 ml, Phenergan 25 mg IV, Zofran 4 mg IV.  Pt did not vomit or have diarrhea in 3.5 hours in MAU.  Tolerated crackers and ginger ale.  Pt stable at time of discharge.  Assessment: 1. Hyperemesis gravidarum   2. Diarrhea, unspecified type     Plan: Discharge home Labor precautions and fetal kick counts  Follow-up Information    Almon Hercules., MD. Schedule an appointment as soon as possible for a visit today.   Specialty:  Obstetrics and Gynecology Why:  this week if symptoms persist Contact information: [address] [place] [address] [phone]            Medication List    TAKE these medications   ondansetron 8 MG tablet Commonly known as:  ZOFRAN Take 0.5 tablets (4 mg total) by mouth every 8 (eight) hours as needed for nausea or vomiting.   promethazine 25 MG tablet Commonly known as:  PHENERGAN Take 2 tablets (50 mg total) by mouth every 6 (six) hours as needed for nausea or vomiting.   ranitidine 150 MG tablet Commonly known as:  ZANTAC Take 150 mg by mouth 2 (two) times daily.   sertraline 50 MG tablet Commonly known as:  ZOLOFT Take 50 mg by mouth daily.       Sharen Counter Certified Nurse-Midwife 04/30/2016 7:58 PM

## 2016-04-30 NOTE — Discharge Instructions (Signed)
Eating Plan for Hyperemesis Gravidarum °Severe cases of hyperemesis gravidarum can lead to dehydration and malnutrition. The hyperemesis eating plan is one way to lessen the symptoms of nausea and vomiting. It is often used with prescribed medicines to control your symptoms.  °WHAT CAN I DO TO RELIEVE MY SYMPTOMS? °Listen to your body. Everyone is different and has different preferences. Find what works best for you. Some of the following things may help: °· Eat and drink slowly. °· Eat 5-6 small meals daily instead of 3 large meals.   °· Eat crackers before you get out of bed in the morning.   °· Starchy foods are usually well tolerated (such as cereal, toast, bread, potatoes, pasta, rice, and pretzels).   °· Ginger may help with nausea. Add ¼ tsp ground ginger to hot tea or choose ginger tea.   °· Try drinking 100% fruit juice or an electrolyte drink. °· Continue to take your prenatal vitamins as directed by your health care provider. If you are having trouble taking your prenatal vitamins, talk with your health care provider about different options. °· Include at least 1 serving of protein with your meals and snacks (such as meats or poultry, beans, nuts, eggs, or yogurt). Try eating a protein-rich snack before bed (such as cheese and crackers or a half turkey or peanut butter sandwich). °WHAT THINGS SHOULD I AVOID TO REDUCE MY SYMPTOMS? °The following things may help reduce your symptoms: °· Avoid foods with strong smells. Try eating meals in well-ventilated areas that are free of odors. °· Avoid drinking water or other beverages with meals. Try not to drink anything less than 30 minutes before and after meals. °· Avoid drinking more than 1 cup of fluid at a time. °· Avoid fried or high-fat foods, such as butter and cream sauces. °· Avoid spicy foods. °· Avoid skipping meals the best you can. Nausea can be more intense on an empty stomach. If you cannot tolerate food at that time, do not force it. Try sucking on  ice chips or other frozen items and make up the calories later. °· Avoid lying down within 2 hours after eating. °  °This information is not intended to replace advice given to you by your health care provider. Make sure you discuss any questions you have with your health care provider. °  °Document Released: 06/22/2007 Document Revised: 08/30/2013 Document Reviewed: 06/29/2013 °Elsevier Interactive Patient Education ©2016 [employer] Inc[employer] ° °

## 2016-04-30 NOTE — MAU Note (Signed)
Vomiting, can't keep anything down.  Worse last 2 days. Also having diarrhea, started yesterday. [facility] Denies fever. Cramping in abd, feels like a stomach ache, and back is aching.

## 2016-05-06 ENCOUNTER — Ambulatory Visit (HOSPITAL_BASED_OUTPATIENT_CLINIC_OR_DEPARTMENT_OTHER): Payer: 59

## 2016-05-06 VITALS — BP 108/63 | HR 98 | Temp 98.3°F | Resp 16

## 2016-05-06 DIAGNOSIS — D518 Other vitamin B12 deficiency anemias: Secondary | ICD-10-CM | POA: Diagnosis not present

## 2016-05-06 DIAGNOSIS — D519 Vitamin B12 deficiency anemia, unspecified: Secondary | ICD-10-CM

## 2016-05-06 DIAGNOSIS — D509 Iron deficiency anemia, unspecified: Secondary | ICD-10-CM

## 2016-05-06 MED ORDER — CYANOCOBALAMIN 1000 MCG/ML IJ SOLN
1000.0000 ug | Freq: Once | INTRAMUSCULAR | Status: AC
Start: 2016-05-06 — End: 2016-05-06
  Administered 2016-05-06: 1000 ug via INTRAMUSCULAR

## 2016-05-09 ENCOUNTER — Other Ambulatory Visit (HOSPITAL_BASED_OUTPATIENT_CLINIC_OR_DEPARTMENT_OTHER): Payer: 59

## 2016-05-09 DIAGNOSIS — D509 Iron deficiency anemia, unspecified: Secondary | ICD-10-CM | POA: Diagnosis not present

## 2016-05-09 DIAGNOSIS — D518 Other vitamin B12 deficiency anemias: Secondary | ICD-10-CM

## 2016-05-09 DIAGNOSIS — D519 Vitamin B12 deficiency anemia, unspecified: Secondary | ICD-10-CM

## 2016-05-09 LAB — CBC & DIFF AND RETIC
BASO%: 0 % (ref 0.0–2.0)
Basophils Absolute: 0 10*3/uL (ref 0.0–0.1)
EOS%: 0.8 % (ref 0.0–7.0)
Eosinophils Absolute: 0.1 10*3/uL (ref 0.0–0.5)
HCT: 30.4 % — ABNORMAL LOW (ref 34.8–46.6)
HGB: 10 g/dL — ABNORMAL LOW (ref 11.6–15.9)
Immature Retic Fract: 14.7 % — ABNORMAL HIGH (ref 1.60–10.00)
LYMPH%: 31.6 % (ref 14.0–49.7)
MCH: 28.4 pg (ref 25.1–34.0)
MCHC: 32.9 g/dL (ref 31.5–36.0)
MCV: 86.4 fL (ref 79.5–101.0)
MONO#: 0.5 10*3/uL (ref 0.1–0.9)
MONO%: 5.9 % (ref 0.0–14.0)
NEUT#: 4.8 10*3/uL (ref 1.5–6.5)
NEUT%: 61.7 % (ref 38.4–76.8)
PLATELETS: 147 10*3/uL (ref 145–400)
RBC: 3.52 10*6/uL — AB (ref 3.70–5.45)
RDW: 14.3 % (ref 11.2–14.5)
Retic %: 1.82 % (ref 0.70–2.10)
Retic Ct Abs: 64.06 10*3/uL (ref 33.70–90.70)
WBC: 7.8 10*3/uL (ref 3.9–10.3)
lymph#: 2.5 10*3/uL (ref 0.9–3.3)

## 2016-05-09 LAB — FERRITIN: Ferritin: 101 ng/ml (ref 9–269)

## 2016-05-10 LAB — VITAMIN B12: Vitamin B12: 404 pg/mL (ref 211–946)

## 2016-05-13 ENCOUNTER — Telehealth: Payer: Self-pay | Admitting: *Deleted

## 2016-05-13 NOTE — Telephone Encounter (Signed)
-----   Message from Artis DelayNi Gorsuch, MD sent at 05/13/2016  8:11 AM EDT ----- Regarding: labs Pls call patient. Labs are fine. No need B12 or IV iron Recommend recheck in 3 months If OK with patient, let me know and I will order return visit ----- Message ----- From: Interface, Lab In Three Zero One Sent: 05/09/2016  12:15 PM To: Artis DelayNi Gorsuch, MD

## 2016-05-13 NOTE — Telephone Encounter (Signed)
Left message with results below. To call us back if she is OK with returning in 3 months.

## 2016-05-14 ENCOUNTER — Ambulatory Visit: Payer: 59

## 2016-05-14 ENCOUNTER — Ambulatory Visit: Payer: 59 | Admitting: Hematology and Oncology

## 2016-05-16 ENCOUNTER — Other Ambulatory Visit: Payer: Self-pay | Admitting: Obstetrics and Gynecology

## 2016-05-16 LAB — OB RESULTS CONSOLE GBS: GBS: POSITIVE

## 2016-06-06 ENCOUNTER — Inpatient Hospital Stay (HOSPITAL_COMMUNITY)
Admission: AD | Admit: 2016-06-06 | Discharge: 2016-06-06 | Disposition: A | Discharge status: Home / Self Care | Payer: 59 | Source: Ambulatory Visit | Attending: Obstetrics | Admitting: Obstetrics

## 2016-06-06 ENCOUNTER — Encounter (HOSPITAL_COMMUNITY): Payer: Self-pay

## 2016-06-06 DIAGNOSIS — Z3493 Encounter for supervision of normal pregnancy, unspecified, third trimester: Secondary | ICD-10-CM | POA: Diagnosis not present

## 2016-06-06 DIAGNOSIS — Z3A39 39 weeks gestation of pregnancy: Secondary | ICD-10-CM | POA: Diagnosis not present

## 2016-06-06 MED ORDER — ZOLPIDEM TARTRATE 5 MG PO TABS
5.0000 mg | ORAL_TABLET | Freq: Once | ORAL | Status: AC
  Administered 2016-06-06: 5 mg via ORAL
  Filled 2016-06-06: qty 1

## 2016-06-06 NOTE — MAU Note (Signed)
Recheck patient in 1 hour and get serial BP's per MD order.

## 2016-06-06 NOTE — Discharge Instructions (Signed)
Braxton Hicks Contractions °Contractions of the uterus can occur throughout pregnancy. Contractions are not always a sign that you are in labor.  °WHAT ARE BRAXTON HICKS CONTRACTIONS?  °Contractions that occur before labor are called Braxton Hicks contractions, or false labor. Toward the end of pregnancy (32-34 weeks), these contractions can develop more often and may become more forceful. This is not true labor because these contractions do not result in opening (dilatation) and thinning of the cervix. They are sometimes difficult to tell apart from true labor because these contractions can be forceful and people have different pain tolerances. You should not feel embarrassed if you go to the hospital with false labor. Sometimes, the only way to tell if you are in true labor is for your health care provider to look for changes in the cervix. °If there are no prenatal problems or other health problems associated with the pregnancy, it is completely safe to be sent home with false labor and await the onset of true labor. °HOW CAN YOU TELL THE DIFFERENCE BETWEEN TRUE AND FALSE LABOR? °False Labor °· The contractions of false labor are usually shorter and not as hard as those of true labor.   °· The contractions are usually irregular.   °· The contractions are often felt in the front of the lower abdomen and in the groin.   °· The contractions may go away when you walk around or change positions while lying down.   °· The contractions get weaker and are shorter lasting as time goes on.   °· The contractions do not usually become progressively stronger, regular, and closer together as with true labor.   °True Labor °· Contractions in true labor last 30-70 seconds, become very regular, usually become more intense, and increase in frequency.   °· The contractions do not go away with walking.   °· The discomfort is usually felt in the top of the uterus and spreads to the lower abdomen and low back.   °· True labor can be  determined by your health care provider with an exam. This will show that the cervix is dilating and getting thinner.   °WHAT TO REMEMBER °· Keep up with your usual exercises and follow other instructions given by your health care provider.   °· Take medicines as directed by your health care provider.   °· Keep your regular prenatal appointments.   °· Eat and drink lightly if you think you are going into labor.   °· If Braxton Hicks contractions are making you uncomfortable:   °¨ Change your position from lying down or resting to walking, or from walking to resting.   °¨ Sit and rest in a tub of warm water.   °¨ Drink 2-3 glasses of water. Dehydration may cause these contractions.   °¨ Do slow and deep breathing several times an hour.   °WHEN SHOULD I SEEK IMMEDIATE MEDICAL CARE? °Seek immediate medical care if: °· Your contractions become stronger, more regular, and closer together.   °· You have fluid leaking or gushing from your vagina.   °· You have a fever.   °· You pass blood-tinged mucus.   °· You have vaginal bleeding.   °· You have continuous abdominal pain.   °· You have low back pain that you never had before.   °· You feel your baby's head pushing down and causing pelvic pressure.   °· Your baby is not moving as much as it used to.   °  °This information is not intended to replace advice given to you by your health care provider. Make sure you discuss any questions you have with your health care   provider. °  °Document Released: 08/25/2005 Document Revised: 08/30/2013 Document Reviewed: 06/06/2013 °Elsevier Interactive Patient Education ©2016 [employer] °Fetal Movement Counts °Patient Name: __________________________________________________ Patient Due Date: ____________________ °Performing a fetal movement count is highly recommended in high-risk pregnancies, but it is good for every pregnant woman to do. Your health care provider may ask you to start counting fetal movements at 28 weeks of the  pregnancy. Fetal movements often increase: °· After eating a full meal. °· After physical activity. °· After eating or drinking something sweet or cold. °· At rest. °Pay attention to when you feel the baby is most active. This will help you notice a pattern of your baby's sleep and wake cycles and what factors contribute to an increase in fetal movement. It is important to perform a fetal movement count at the same time each day when your baby is normally most active.  °HOW TO COUNT FETAL MOVEMENTS °1. Find a quiet and comfortable area to sit or lie down on your left side. Lying on your left side provides the best blood and oxygen circulation to your baby. °2. Write down the day and time on a sheet of paper or in a journal. °3. Start counting kicks, flutters, swishes, rolls, or jabs in a 2-hour period. You should feel at least 10 movements within 2 hours. °4. If you do not feel 10 movements in 2 hours, wait 2-3 hours and count again. Look for a change in the pattern or not enough counts in 2 hours. °SEEK MEDICAL CARE IF: °· You feel less than 10 counts in 2 hours, tried twice. °· There is no movement in over an hour. °· The pattern is changing or taking longer each day to reach 10 counts in 2 hours. °· You feel the baby is not moving as he or she usually does. °Date: ____________ Movements: ____________ Start time: ____________ Finish time: ____________  °Date: ____________ Movements: ____________ Start time: ____________ Finish time: ____________ °Date: ____________ Movements: ____________ Start time: ____________ Finish time: ____________ °Date: ____________ Movements: ____________ Start time: ____________ Finish time: ____________ °Date: ____________ Movements: ____________ Start time: ____________ Finish time: ____________ °Date: ____________ Movements: ____________ Start time: ____________ Finish time: ____________ °Date: ____________ Movements: ____________ Start time: ____________ Finish time:  ____________ °Date: ____________ Movements: ____________ Start time: ____________ Finish time: ____________  °Date: ____________ Movements: ____________ Start time: ____________ Finish time: ____________ °Date: ____________ Movements: ____________ Start time: ____________ Finish time: ____________ °Date: ____________ Movements: ____________ Start time: ____________ Finish time: ____________ °Date: ____________ Movements: ____________ Start time: ____________ Finish time: ____________ °Date: ____________ Movements: ____________ Start time: ____________ Finish time: ____________ °Date: ____________ Movements: ____________ Start time: ____________ Finish time: ____________ °Date: ____________ Movements: ____________ Start time: ____________ Finish time: ____________  °Date: ____________ Movements: ____________ Start time: ____________ Finish time: ____________ °Date: ____________ Movements: ____________ Start time: ____________ Finish time: ____________ °Date: ____________ Movements: ____________ Start time: ____________ Finish time: ____________ °Date: ____________ Movements: ____________ Start time: ____________ Finish time: ____________ °Date: ____________ Movements: ____________ Start time: ____________ Finish time: ____________ °Date: ____________ Movements: ____________ Start time: ____________ Finish time: ____________ °Date: ____________ Movements: ____________ Start time: ____________ Finish time: ____________  °Date: ____________ Movements: ____________ Start time: ____________ Finish time: ____________ °Date: ____________ Movements: ____________ Start time: ____________ Finish time: ____________ °Date: ____________ Movements: ____________ Start time: ____________ Finish time: ____________ °Date: ____________ Movements: ____________ Start time: ____________ Finish time: ____________ °Date: ____________ Movements: ____________ Start time: ____________ Finish time: ____________ °Date: ____________ Movements:  ____________ Start time: ____________ Finish time:   ____________ °Date: ____________ Movements: ____________ Start time: ____________ Finish time: ____________  °Date: ____________ Movements: ____________ Start time: ____________ Finish time: ____________ °Date: ____________ Movements: ____________ Start time: ____________ Finish time: ____________ °Date: ____________ Movements: ____________ Start time: ____________ Finish time: ____________ °Date: ____________ Movements: ____________ Start time: ____________ Finish time: ____________ °Date: ____________ Movements: ____________ Start time: ____________ Finish time: ____________ °Date: ____________ Movements: ____________ Start time: ____________ Finish time: ____________ °Date: ____________ Movements: ____________ Start time: ____________ Finish time: ____________  °Date: ____________ Movements: ____________ Start time: ____________ Finish time: ____________ °Date: ____________ Movements: ____________ Start time: ____________ Finish time: ____________ °Date: ____________ Movements: ____________ Start time: ____________ Finish time: ____________ °Date: ____________ Movements: ____________ Start time: ____________ Finish time: ____________ °Date: ____________ Movements: ____________ Start time: ____________ Finish time: ____________ °Date: ____________ Movements: ____________ Start time: ____________ Finish time: ____________ °Date: ____________ Movements: ____________ Start time: ____________ Finish time: ____________  °Date: ____________ Movements: ____________ Start time: ____________ Finish time: ____________ °Date: ____________ Movements: ____________ Start time: ____________ Finish time: ____________ °Date: ____________ Movements: ____________ Start time: ____________ Finish time: ____________ °Date: ____________ Movements: ____________ Start time: ____________ Finish time: ____________ °Date: ____________ Movements: ____________ Start time: ____________ Finish  time: ____________ °Date: ____________ Movements: ____________ Start time: ____________ Finish time: ____________ °Date: ____________ Movements: ____________ Start time: ____________ Finish time: ____________  °Date: ____________ Movements: ____________ Start time: ____________ Finish time: ____________ °Date: ____________ Movements: ____________ Start time: ____________ Finish time: ____________ °Date: ____________ Movements: ____________ Start time: ____________ Finish time: ____________ °Date: ____________ Movements: ____________ Start time: ____________ Finish time: ____________ °Date: ____________ Movements: ____________ Start time: ____________ Finish time: ____________ °Date: ____________ Movements: ____________ Start time: ____________ Finish time: ____________ °  °This information is not intended to replace advice given to you by your health care provider. Make sure you discuss any questions you have with your health care provider. °  °Document Released: 09/24/2006 Document Revised: 09/15/2014 Document Reviewed: 06/21/2012 °Elsevier Interactive Patient Education ©2016 Elsevier Inc. ° °

## 2016-06-06 NOTE — MAU Note (Signed)
Pt reports contractions every 3-5 mins since 0130. Denies LOF or vag bleeding. +FM

## 2016-06-10 ENCOUNTER — Inpatient Hospital Stay (HOSPITAL_COMMUNITY)
Admission: AD | Admit: 2016-06-10 | Discharge: 2016-06-13 | DRG: 775 | Disposition: A | Discharge status: Home / Self Care | Payer: 59 | Source: Ambulatory Visit | Attending: Obstetrics and Gynecology | Admitting: Obstetrics and Gynecology

## 2016-06-10 DIAGNOSIS — Z3A39 39 weeks gestation of pregnancy: Secondary | ICD-10-CM | POA: Diagnosis not present

## 2016-06-10 DIAGNOSIS — O99824 Streptococcus B carrier state complicating childbirth: Secondary | ICD-10-CM | POA: Diagnosis present

## 2016-06-10 DIAGNOSIS — O134 Gestational [pregnancy-induced] hypertension without significant proteinuria, complicating childbirth: Principal | ICD-10-CM | POA: Diagnosis present

## 2016-06-10 DIAGNOSIS — Z3490 Encounter for supervision of normal pregnancy, unspecified, unspecified trimester: Secondary | ICD-10-CM

## 2016-06-10 LAB — CBC
HCT: 30.3 % — ABNORMAL LOW (ref 36.0–46.0)
HEMATOCRIT: 31 % — AB (ref 36.0–46.0)
Hemoglobin: 10.1 g/dL — ABNORMAL LOW (ref 12.0–15.0)
Hemoglobin: 10 g/dL — ABNORMAL LOW (ref 12.0–15.0)
MCH: 28.1 pg (ref 26.0–34.0)
MCH: 28.6 pg (ref 26.0–34.0)
MCHC: 32.6 g/dL (ref 30.0–36.0)
MCHC: 33 g/dL (ref 30.0–36.0)
MCV: 86.4 fL (ref 78.0–100.0)
MCV: 86.6 fL (ref 78.0–100.0)
PLATELETS: 158 10*3/uL (ref 150–400)
PLATELETS: 153 10*3/uL (ref 150–400)
RBC: 3.59 MIL/uL — ABNORMAL LOW (ref 3.87–5.11)
RBC: 3.5 MIL/uL — AB (ref 3.87–5.11)
RDW: 13.8 % (ref 11.5–15.5)
RDW: 13.7 % (ref 11.5–15.5)
WBC: 6.1 10*3/uL (ref 4.0–10.5)
WBC: 7.6 10*3/uL (ref 4.0–10.5)

## 2016-06-10 LAB — TYPE AND SCREEN
ABO/RH(D): O POS
ANTIBODY SCREEN: NEGATIVE

## 2016-06-10 LAB — GLUCOSE, CAPILLARY: Glucose-Capillary: 67 mg/dL (ref 65–99)

## 2016-06-10 MED ORDER — FLEET ENEMA 7-19 GM/118ML RE ENEM: 1.0000 | ENEMA | RECTAL | Status: DC | PRN

## 2016-06-10 MED ORDER — ACETAMINOPHEN 325 MG PO TABS: 650.0000 mg | ORAL_TABLET | ORAL | Status: DC | PRN

## 2016-06-10 MED ORDER — PHENYLEPHRINE 40 MCG/ML (10ML) SYRINGE FOR IV PUSH (FOR BLOOD PRESSURE SUPPORT)
80.0000 ug | PREFILLED_SYRINGE | INTRAVENOUS | Status: DC | PRN
  Filled 2016-06-10: qty 5

## 2016-06-10 MED ORDER — EPHEDRINE 5 MG/ML INJ
10.0000 mg | INTRAVENOUS | Status: DC | PRN
  Filled 2016-06-10: qty 4

## 2016-06-10 MED ORDER — EPHEDRINE 5 MG/ML INJ: 10.0000 mg | INTRAVENOUS | Status: DC | PRN

## 2016-06-10 MED ORDER — FENTANYL 2.5 MCG/ML BUPIVACAINE 1/10 % EPIDURAL INFUSION (WH - ANES)
14.0000 mL/h | INTRAMUSCULAR | Status: DC | PRN
  Administered 2016-06-11: 14 mL/h via EPIDURAL
  Filled 2016-06-10 (×2): qty 125

## 2016-06-10 MED ORDER — PENICILLIN G POTASSIUM 5000000 UNITS IJ SOLR
2.5000 10*6.[IU] | INTRAVENOUS | Status: DC
  Administered 2016-06-11 (×4): 2.5 10*6.[IU] via INTRAVENOUS
  Filled 2016-06-10 (×9): qty 2.5

## 2016-06-10 MED ORDER — OXYTOCIN 40 UNITS IN LACTATED RINGERS INFUSION - SIMPLE MED
2.5000 [IU]/h | INTRAVENOUS | Status: DC
  Filled 2016-06-10 (×2): qty 1000

## 2016-06-10 MED ORDER — TERBUTALINE SULFATE 1 MG/ML IJ SOLN
0.2500 mg | Freq: Once | INTRAMUSCULAR | Status: DC | PRN
  Filled 2016-06-10: qty 1

## 2016-06-10 MED ORDER — LACTATED RINGERS IV SOLN
INTRAVENOUS | Status: DC
  Administered 2016-06-10: 21:00:00 via INTRAVENOUS
  Administered 2016-06-10 – 2016-06-11 (×2): 125 mL/h via INTRAVENOUS

## 2016-06-10 MED ORDER — FENTANYL 2.5 MCG/ML BUPIVACAINE 1/10 % EPIDURAL INFUSION (WH - ANES): 14.0000 mL/h | INTRAMUSCULAR | Status: DC | PRN

## 2016-06-10 MED ORDER — LIDOCAINE HCL (PF) 1 % IJ SOLN
30.0000 mL | INTRAMUSCULAR | Status: DC | PRN
  Filled 2016-06-10: qty 30

## 2016-06-10 MED ORDER — LACTATED RINGERS IV SOLN
500.0000 mL | Freq: Once | INTRAVENOUS | Status: DC
Start: 2016-06-10 — End: 2016-06-12

## 2016-06-10 MED ORDER — LACTATED RINGERS IV SOLN: 500.0000 mL | Freq: Once | INTRAVENOUS | Status: DC

## 2016-06-10 MED ORDER — ONDANSETRON HCL 4 MG/2ML IJ SOLN
4.0000 mg | Freq: Four times a day (QID) | INTRAMUSCULAR | Status: DC | PRN
  Administered 2016-06-11: 4 mg via INTRAVENOUS
  Filled 2016-06-10: qty 2

## 2016-06-10 MED ORDER — OXYCODONE-ACETAMINOPHEN 5-325 MG PO TABS
2.0000 | ORAL_TABLET | ORAL | Status: DC | PRN
  Administered 2016-06-12: 2 via ORAL
  Filled 2016-06-10: qty 2

## 2016-06-10 MED ORDER — PENICILLIN G POTASSIUM 5000000 UNITS IJ SOLR
5.0000 10*6.[IU] | Freq: Once | INTRAVENOUS | Status: AC
  Administered 2016-06-10: 5 10*6.[IU] via INTRAVENOUS
  Filled 2016-06-10: qty 5

## 2016-06-10 MED ORDER — DIPHENHYDRAMINE HCL 50 MG/ML IJ SOLN: 12.5000 mg | INTRAMUSCULAR | Status: DC | PRN

## 2016-06-10 MED ORDER — OXYTOCIN BOLUS FROM INFUSION
500.0000 mL | Freq: Once | INTRAVENOUS | Status: AC
  Administered 2016-06-11: 500 mL via INTRAVENOUS

## 2016-06-10 MED ORDER — OXYTOCIN 40 UNITS IN LACTATED RINGERS INFUSION - SIMPLE MED
1.0000 m[IU]/min | INTRAVENOUS | Status: DC
Start: 2016-06-10 — End: 2016-06-12
  Administered 2016-06-10: 2 m[IU]/min via INTRAVENOUS

## 2016-06-10 MED ORDER — OXYCODONE-ACETAMINOPHEN 5-325 MG PO TABS: 1.0000 | ORAL_TABLET | ORAL | Status: DC | PRN

## 2016-06-10 MED ORDER — PHENYLEPHRINE 40 MCG/ML (10ML) SYRINGE FOR IV PUSH (FOR BLOOD PRESSURE SUPPORT): 80.0000 ug | PREFILLED_SYRINGE | INTRAVENOUS | Status: DC | PRN

## 2016-06-10 MED ORDER — LACTATED RINGERS IV SOLN
500.0000 mL | INTRAVENOUS | Status: DC | PRN
  Administered 2016-06-11 (×2): 500 mL via INTRAVENOUS

## 2016-06-10 MED ORDER — SOD CITRATE-CITRIC ACID 500-334 MG/5ML PO SOLN: 30.0000 mL | ORAL | Status: DC | PRN

## 2016-06-10 MED ORDER — MISOPROSTOL 25 MCG QUARTER TABLET
25.0000 ug | ORAL_TABLET | ORAL | Status: DC | PRN
  Administered 2016-06-10 (×2): 25 ug via VAGINAL
  Filled 2016-06-10: qty 0.25
  Filled 2016-06-10: qty 1
  Filled 2016-06-10: qty 0.25

## 2016-06-10 NOTE — H&P (Addendum)
30 y.o. 807w5d  G3P0020 comes in for induction at term with PIH.  Pt had elevated BPs in office today after coming in for decreased fm.  Otherwise no LOF and no bleeding.  NST is reactive.  Past Medical History:  Diagnosis Date  . Anemia, iron deficiency   . HTN (hypertension)   . PCOS (polycystic ovarian syndrome)   . Type II or unspecified type diabetes mellitus without mention of complication, not stated as uncontrolled     Past Surgical History:  Procedure Laterality Date  . LAPAROSCOPIC GASTRIC BANDING    . TONSILLECTOMY      OB History  Gravida Para Term Preterm AB Living  3       2 0  SAB TAB Ectopic Multiple Live Births    2          # Outcome Date GA Lbr Len/2nd Weight Sex Delivery Anes PTL Lv  3 Current           2 TAB           1 TAB               Social History   Social History  . Marital status: Single    Spouse name: N/A  . Number of children: N/A  . Years of education: N/A   Occupational History  . [employer]   Social History Main Topics  . Smoking status: Never Smoker  . Smokeless tobacco: Never Used  . Alcohol use No  . Drug use: No  . Sexual activity: Yes    Partners: Male    Birth control/ protection: None   Other Topics Concern  . Not on file   Social History Narrative   Regular Exercise-yes   Caffeine use: occasionaly               Aspirin; Nsaids; and Latex    Prenatal Transfer Tool  Maternal Diabetes: No- pt has hx of pregestational hyperglycemia, but became hypoglycemic after bypass surgery; not currently diabetic Genetic Screening: Normal Maternal Ultrasounds/Referrals: Normal Fetal Ultrasounds or other Referrals:  None Maternal Substance Abuse:  No Significant Maternal Medications:  None Significant Maternal Lab Results: None  Other PNC: uncomplicated.    Vitals:   06/10/16 1315 06/10/16 1329  BP:  118/73  Pulse:  99  Resp:  18  Temp: 98.4 F (36.9 C)      Lungs/Cor:  NAD Abdomen:  soft,  gravid Ex:  no cords, erythema SVE:  1/50/-2, FHTs:  120s, good STV, NST R Toco:  q 10   A/P   Term with PIH- for induction with cytotec.  GBS pos- pcn when pitocin started.  Eldean Klatt A

## 2016-06-10 NOTE — Anesthesia Pain Management Evaluation Note (Signed)
  [facility] Pain Management Visit Note  Patient: Anna Nguyen, 30 y.o., female  "Hello [facility] am a [facility] the [facility]. We have an anesthesia team available at all times to provide care throughout the hospital, including epidural management and anesthesia for C-section. I don't know your plan for the delivery whether it a natural birth, water birth, IV sedation, nitrous supplementation, doula or epidural, but we want to meet your pain goals."   1.Was your pain managed to your expectations on prior hospitalizations?   No prior hospitalizations  2.What is your expectation for pain management during this hospitalization?     Epidural  3.How can we help you reach that goal? epidural  Record the patient's initial score and the patient's pain goal.   Pain: 0  Pain Goal: 7 [facility] wants you to be able to say your pain was always managed very well.  Wenzel Backlund 06/10/2016

## 2016-06-10 NOTE — Progress Notes (Signed)
Pt was tracing on another pt's obix from 1315-1419. MRN [id number]. During that time, pt had FHR baseline of 135, moderate variability and no decelerations. Occasional uterine contractions noted.

## 2016-06-11 ENCOUNTER — Encounter (HOSPITAL_COMMUNITY): Payer: Self-pay | Admitting: Anesthesiology

## 2016-06-11 ENCOUNTER — Inpatient Hospital Stay (HOSPITAL_COMMUNITY): Payer: 59 | Admitting: Anesthesiology

## 2016-06-11 ENCOUNTER — Encounter (HOSPITAL_COMMUNITY): Payer: Self-pay

## 2016-06-11 LAB — GLUCOSE, CAPILLARY
GLUCOSE-CAPILLARY: 69 mg/dL (ref 65–99)
GLUCOSE-CAPILLARY: 80 mg/dL (ref 65–99)
Glucose-Capillary: 113 mg/dL — ABNORMAL HIGH (ref 65–99)
Glucose-Capillary: 86 mg/dL (ref 65–99)

## 2016-06-11 LAB — CBC
HEMATOCRIT: 32.1 % — AB (ref 36.0–46.0)
Hemoglobin: 10.7 g/dL — ABNORMAL LOW (ref 12.0–15.0)
MCH: 28.6 pg (ref 26.0–34.0)
MCHC: 33.3 g/dL (ref 30.0–36.0)
MCV: 85.8 fL (ref 78.0–100.0)
Platelets: 155 10*3/uL (ref 150–400)
RBC: 3.74 MIL/uL — ABNORMAL LOW (ref 3.87–5.11)
RDW: 13.5 % (ref 11.5–15.5)
WBC: 12.5 10*3/uL — AB (ref 4.0–10.5)

## 2016-06-11 LAB — ABO/RH: ABO/RH(D): O POS

## 2016-06-11 LAB — RPR: RPR Ser Ql: NONREACTIVE

## 2016-06-11 MED ORDER — PHENYLEPHRINE 40 MCG/ML (10ML) SYRINGE FOR IV PUSH (FOR BLOOD PRESSURE SUPPORT)
80.0000 ug | PREFILLED_SYRINGE | INTRAVENOUS | Status: DC | PRN
  Filled 2016-06-11: qty 5

## 2016-06-11 MED ORDER — FENTANYL 2.5 MCG/ML BUPIVACAINE 1/10 % EPIDURAL INFUSION (WH - ANES)
INTRAMUSCULAR | Status: AC
  Filled 2016-06-11: qty 125

## 2016-06-11 MED ORDER — LACTATED RINGERS IV SOLN: 500.0000 mL | Freq: Once | INTRAVENOUS | Status: DC

## 2016-06-11 MED ORDER — EPHEDRINE 5 MG/ML INJ
10.0000 mg | INTRAVENOUS | Status: DC | PRN
  Filled 2016-06-11: qty 4

## 2016-06-11 MED ORDER — LIDOCAINE HCL (PF) 1 % IJ SOLN
INTRAMUSCULAR | Status: DC | PRN
  Administered 2016-06-11 (×2): 7 mL via EPIDURAL

## 2016-06-11 MED ORDER — FENTANYL 2.5 MCG/ML BUPIVACAINE 1/10 % EPIDURAL INFUSION (WH - ANES)
14.0000 mL/h | INTRAMUSCULAR | Status: DC | PRN
  Administered 2016-06-11 (×2): 14 mL/h via EPIDURAL

## 2016-06-11 MED ORDER — PHENYLEPHRINE 40 MCG/ML (10ML) SYRINGE FOR IV PUSH (FOR BLOOD PRESSURE SUPPORT)
PREFILLED_SYRINGE | INTRAVENOUS | Status: AC
  Filled 2016-06-11: qty 20

## 2016-06-11 MED ORDER — METHYLERGONOVINE MALEATE 0.2 MG/ML IJ SOLN
25.0000 mg | Freq: Once | INTRAMUSCULAR | Status: DC
  Filled 2016-06-11: qty 125

## 2016-06-11 MED ORDER — METHYLERGONOVINE MALEATE 0.2 MG/ML IJ SOLN
INTRAMUSCULAR | Status: AC
  Administered 2016-06-11: 0.2 mg
  Filled 2016-06-11: qty 1

## 2016-06-11 MED ORDER — DIPHENHYDRAMINE HCL 50 MG/ML IJ SOLN: 12.5000 mg | INTRAMUSCULAR | Status: DC | PRN

## 2016-06-11 NOTE — Anesthesia Preprocedure Evaluation (Signed)
Anesthesia Evaluation  Patient identified by MRN, date of birth, ID band Patient awake    Reviewed: Allergy & Precautions, H&P , Patient's Chart, lab work & pertinent test results  Airway Mallampati: II  TM Distance: >3 FB Neck ROM: full    Dental no notable dental hx.    Pulmonary neg pulmonary ROS,    Pulmonary exam normal        Cardiovascular hypertension, negative cardio ROS Normal cardiovascular exam     Neuro/Psych negative neurological ROS     GI/Hepatic negative GI ROS, Neg liver ROS,   Endo/Other  negative endocrine ROSdiabetes  Renal/GU negative Renal ROS     Musculoskeletal   Abdominal (+) + obese,   Peds  Hematology   Anesthesia Other Findings   Reproductive/Obstetrics (+) Pregnancy                             Anesthesia Physical Anesthesia Plan  ASA: II  Anesthesia Plan: Epidural   Post-op Pain Management:    Induction:   Airway Management Planned:   Additional Equipment:   Intra-op Plan:   Post-operative Plan:   Informed Consent: I have reviewed the patients History and Physical, chart, labs and discussed the procedure including the risks, benefits and alternatives for the proposed anesthesia with the patient or authorized representative who has indicated his/her understanding and acceptance.     Plan Discussed with:   Anesthesia Plan Comments:         Anesthesia Quick Evaluation

## 2016-06-11 NOTE — Anesthesia Procedure Notes (Signed)
Epidural Patient location during procedure: OB Start time: 06/11/2016 4:04 AM End time: 06/11/2016 4:08 AM  Staffing Anesthesiologist: Leilani AbleHATCHETT, Atalaya Zappia Performed: anesthesiologist   Preanesthetic Checklist Completed: patient identified, surgical consent, pre-op evaluation, timeout performed, IV checked, risks and benefits discussed and monitors and equipment checked  Epidural Patient position: sitting Prep: site prepped and draped and DuraPrep Patient monitoring: continuous pulse ox and blood pressure Approach: midline Location: L3-L4 Injection technique: LOR air  Needle:  Needle type: Tuohy  Needle gauge: 17 G Needle length: 9 cm and 9 Needle insertion depth: 8 cm Catheter type: closed end flexible Catheter size: 19 Gauge Catheter at skin depth: 13 cm Test dose: negative and Other  Assessment Sensory level: T9 Events: blood not aspirated, injection not painful, no injection resistance, negative IV test and no paresthesia  Additional Notes Reason for block:procedure for pain

## 2016-06-11 NOTE — Progress Notes (Signed)
Pt sleeping. MDUs> 200. Per nurse no change in cervix. Has IUPC in place. FHTs reactive. Will follow.

## 2016-06-11 NOTE — Progress Notes (Signed)
FHTs 140s, NST R,  Toco q 5  SVE 3/70/-2, AROM clear.

## 2016-06-11 NOTE — Progress Notes (Signed)
Kittie PlaterSarah Jaquil Todt RN assumed pt care at [id number]

## 2016-06-11 NOTE — Progress Notes (Signed)
Pt is now on pitocin after AROM. Ctxs not picking up.  Will place IUPC

## 2016-06-12 LAB — CBC
HEMATOCRIT: 28.6 % — AB (ref 36.0–46.0)
HEMOGLOBIN: 9.5 g/dL — AB (ref 12.0–15.0)
MCH: 28.4 pg (ref 26.0–34.0)
MCHC: 33.2 g/dL (ref 30.0–36.0)
MCV: 85.6 fL (ref 78.0–100.0)
Platelets: 149 10*3/uL — ABNORMAL LOW (ref 150–400)
RBC: 3.34 MIL/uL — AB (ref 3.87–5.11)
RDW: 13.5 % (ref 11.5–15.5)
WBC: 12.8 10*3/uL — ABNORMAL HIGH (ref 4.0–10.5)

## 2016-06-12 LAB — CCBB MATERNAL DONOR DRAW

## 2016-06-12 MED ORDER — SIMETHICONE 80 MG PO CHEW: 80.0000 mg | CHEWABLE_TABLET | ORAL | Status: DC | PRN

## 2016-06-12 MED ORDER — IBUPROFEN 600 MG PO TABS: 600.0000 mg | ORAL_TABLET | Freq: Four times a day (QID) | ORAL | Status: DC

## 2016-06-12 MED ORDER — OXYCODONE-ACETAMINOPHEN 5-325 MG PO TABS
2.0000 | ORAL_TABLET | ORAL | Status: DC | PRN
  Administered 2016-06-12 – 2016-06-13 (×5): 2 via ORAL
  Filled 2016-06-12 (×6): qty 2

## 2016-06-12 MED ORDER — ACETAMINOPHEN 325 MG PO TABS
650.0000 mg | ORAL_TABLET | ORAL | Status: DC | PRN
  Administered 2016-06-12: 650 mg via ORAL
  Filled 2016-06-12 (×2): qty 2

## 2016-06-12 MED ORDER — OXYCODONE-ACETAMINOPHEN 5-325 MG PO TABS
1.0000 | ORAL_TABLET | ORAL | Status: DC | PRN
  Administered 2016-06-13: 1 via ORAL

## 2016-06-12 MED ORDER — TETANUS-DIPHTH-ACELL PERTUSSIS 5-2.5-18.5 LF-MCG/0.5 IM SUSP: 0.5000 mL | Freq: Once | INTRAMUSCULAR | Status: DC

## 2016-06-12 MED ORDER — MEASLES, MUMPS & RUBELLA VAC ~~LOC~~ INJ
0.5000 mL | INJECTION | Freq: Once | SUBCUTANEOUS | Status: DC
  Filled 2016-06-12: qty 0.5

## 2016-06-12 MED ORDER — ONDANSETRON HCL 4 MG PO TABS: 4.0000 mg | ORAL_TABLET | ORAL | Status: DC | PRN

## 2016-06-12 MED ORDER — DIBUCAINE 1 % RE OINT
1.0000 "application " | TOPICAL_OINTMENT | RECTAL | Status: DC | PRN
  Administered 2016-06-12 – 2016-06-13 (×2): 1 via RECTAL
  Filled 2016-06-12 (×2): qty 28

## 2016-06-12 MED ORDER — WITCH HAZEL-GLYCERIN EX PADS: 1.0000 "application " | MEDICATED_PAD | CUTANEOUS | Status: DC | PRN

## 2016-06-12 MED ORDER — BENZOCAINE-MENTHOL 20-0.5 % EX AERO
1.0000 "application " | INHALATION_SPRAY | CUTANEOUS | Status: DC | PRN
  Administered 2016-06-12 – 2016-06-13 (×2): 1 via TOPICAL
  Filled 2016-06-12 (×2): qty 56

## 2016-06-12 MED ORDER — OXYCODONE-ACETAMINOPHEN 5-325 MG PO TABS: 1.0000 | ORAL_TABLET | ORAL | Status: DC | PRN

## 2016-06-12 MED ORDER — ZOLPIDEM TARTRATE 5 MG PO TABS: 5.0000 mg | ORAL_TABLET | Freq: Every evening | ORAL | Status: DC | PRN

## 2016-06-12 MED ORDER — ONDANSETRON HCL 4 MG/2ML IJ SOLN: 4.0000 mg | INTRAMUSCULAR | Status: DC | PRN

## 2016-06-12 MED ORDER — COCONUT OIL OIL
1.0000 "application " | TOPICAL_OIL | Status: DC | PRN
  Administered 2016-06-12: 1 via TOPICAL
  Filled 2016-06-12: qty 120

## 2016-06-12 MED ORDER — SENNOSIDES-DOCUSATE SODIUM 8.6-50 MG PO TABS
2.0000 | ORAL_TABLET | ORAL | Status: DC
  Administered 2016-06-12: 2 via ORAL
  Filled 2016-06-12: qty 2

## 2016-06-12 NOTE — Anesthesia Postprocedure Evaluation (Signed)
Anesthesia Post Note  Patient: Anna DanielsCharisse R Braxton  Procedure(s) Performed: * No procedures listed *  Patient location during evaluation: Mother Baby Anesthesia Type: Epidural Level of consciousness: awake Pain management: pain level controlled Vital Signs Assessment: post-procedure vital signs reviewed and stable Respiratory status: spontaneous breathing Cardiovascular status: stable Postop Assessment: no headache, no backache, epidural receding and patient able to bend at knees Anesthetic complications: no     Last Vitals:  Vitals:   06/12/16 0100 06/12/16 0500  BP: (!) 136/96 117/67  Pulse: 76 85  Resp: 20 18  Temp:  36.9 C    Last Pain:  Vitals:   06/12/16 0500  TempSrc: Oral  PainSc: 4    Pain Goal: Patients Stated Pain Goal: 1 (06/12/16 0500)               Edison PaceWILKERSON,Markos Theil

## 2016-06-12 NOTE — Progress Notes (Signed)
Patient is eating, ambulating, voiding.  Pain control is good.  Appropriate lochia, no complaints.  Vitals:   06/11/16 2201 06/12/16 0000 06/12/16 0100 06/12/16 0500  BP: 136/84 140/68 (!) 136/96 117/67  Pulse: 89 82 76 85  Resp:  18 20 18   Temp: 98.7 F (37.1 C) 99 F (37.2 C)  98.5 F (36.9 C)  TempSrc: Oral Oral  Oral  SpO2:      Weight:      Height:        Fundus firm Perineum without swelling. No CT  Lab Results  Component Value Date   WBC 12.8 (H) 06/12/2016   HGB 9.5 (L) 06/12/2016   HCT 28.6 (L) 06/12/2016   MCV 85.6 06/12/2016   PLT 149 (L) 06/12/2016    --/--/O POS, O POS (10/03 1330)  A/P Post partum day 1.  Routine care.  Expect d/c 10/6.    Philip AspenALLAHAN, Deena Shaub

## 2016-06-12 NOTE — Lactation Note (Signed)
This note was copied from a baby's chart. Lactation Consultation Note  Patient Name: Anna Nguyen QUHKI'S Date: 06/12/2016 Reason for consult: Initial assessment Breastfeeding consultation services and support information given and reviewed with patient.  Mom states her goal is to exclusively pump and bottle feed.  She has allowed the baby to latch thus far with RN assist but states it is too painful.  I offered assist but mom desires to begin pumping.  She has brought her own Spectra pump to the hospital.  I recommended I set her up with the symphony pump but she does not want to get charged for kit.  Informed mom that baby may need to be supplemented with formula until milk comes in if colostrum not obtained.  Encouraged to call if she has concerns or needs assist.  Reviewed cleaning pump parts and EBM storage.  Maternal Data Has patient been taught Hand Expression?: Yes Does the patient have breastfeeding experience prior to this delivery?: No  Feeding Feeding Type: Breast Fed Length of feed: 20 min  LATCH Score/Interventions Latch: Grasps breast easily, tongue down, lips flanged, rhythmical sucking. Intervention(s): Adjust position;Assist with latch;Breast compression  Audible Swallowing: A few with stimulation Intervention(s): Skin to skin;Hand expression  Type of Nipple: Everted at rest and after stimulation  Comfort (Breast/Nipple): Filling, red/small blisters or bruises, mild/mod discomfort  Problem noted: Mild/Moderate discomfort  Hold (Positioning): Full assist, staff holds infant at breast  LATCH Score: 6  Lactation Tools Discussed/Used     Consult Status Consult Status: Follow-up Date: 06/13/16 Follow-up type: In-patient    Ave Filter 06/12/2016, 2:49 PM

## 2016-06-13 LAB — BIRTH TISSUE RECOVERY COLLECTION (PLACENTA DONATION)

## 2016-06-13 MED ORDER — PNEUMOCOCCAL VAC POLYVALENT 25 MCG/0.5ML IJ INJ
0.5000 mL | INJECTION | Freq: Once | INTRAMUSCULAR | Status: AC
  Administered 2016-06-13: 0.5 mL via INTRAMUSCULAR
  Filled 2016-06-13: qty 0.5

## 2016-06-13 MED ORDER — HYDROCODONE-IBUPROFEN 5-200 MG PO TABS: 1.0000 | ORAL_TABLET | Freq: Three times a day (TID) | ORAL | 0 refills | Status: DC | PRN

## 2016-06-13 NOTE — Progress Notes (Signed)
MOB was referred for history of depression/anxiety. * Referral screened out by Clinical Social Worker because none of the following criteria appear to apply: ~ History of anxiety/depression during this pregnancy, or of post-partum depression. ~ Diagnosis of anxiety and/or depression within last 3 years OR * MOB's symptoms currently being treated with medication and/or therapy.  CSW completed chart review an MOB is currently taking Zoloft.  Please contact the Clinical Social Worker if needs arise, or if MOB requests.  Blaine HamperAngel Boyd-Gilyard, MSW, LCSW Clinical Social Work [phone]

## 2016-06-13 NOTE — Progress Notes (Signed)
Called MD and left a message at about 1045 bc MD wrote a prescription for Vicoprofen, which contains Ibuprofen and this patient is allergic.  This RN called MD again at approximately 1230 and MD answered; requested MD to give patient an alternate pain medication prescription in which he said "OK."  At about 1430 the charge RN called again due to this RN being in another patient's room;  Patient still did not receive a prescription.  The fourth time the MD was contacted he stated the prescription had been faxed, but the pharmacy stated that they had not received it.  The MD was notified, who then stated he would drop the prescription off at the pharmacy.  Patient was then discharged.

## 2016-06-13 NOTE — Lactation Note (Signed)
This note was copied from a baby's chart. Lactation Consultation Note: Mom reports baby has been feeding very vigorously and nipples are very sore. Has not put the baby to the breast since 11 pm last night - has been bottle feeding formula because her nipples are so sore. Both nipples pink with positional stripes noted. Using coconut oil on the.  Has been pumping- using her own Spectra pump. Reports no pain with pumping. Mom getting ready to eat breakfast. Baby asleep. Offered assist with latch and mom agreeable. Will come back in 30 min to assist. No questions at present.   Patient Name: Girl Anna ChrisCharisse Braxton ONGEX'BToday's Date: 06/13/2016 Reason for consult: Follow-up assessment   Maternal Data Formula Feeding for Exclusion: No Has patient been taught Hand Expression?: Yes Does the patient have breastfeeding experience prior to this delivery?: No  Feeding    LATCH Score/Interventions       Type of Nipple: Everted at rest and after stimulation  Comfort (Breast/Nipple): Filling, red/small blisters or bruises, mild/mod discomfort  Problem noted: Mild/Moderate discomfort Interventions (Mild/moderate discomfort): Hand expression (coconut oil)        Lactation Tools Discussed/Used     Consult Status Consult Status: Follow-up Date: 06/13/16 Follow-up type: In-patient    Pamelia HoitWeeks, Stasha Naraine D 06/13/2016, 8:11 AM

## 2016-06-13 NOTE — Progress Notes (Signed)
PPD#2 Pt without complaints.Would like to go home. VSSAF IMP/ doing well Plan/ Will discharge

## 2016-06-13 NOTE — Discharge Summary (Signed)
Obstetric Discharge Summary Reason for Admission: induction of labor Prenatal Procedures: NST and ultrasound Intrapartum Procedures: spontaneous vaginal delivery Postpartum Procedures: none Complications-Operative and Postpartum: 1st degree perineal laceration Hemoglobin  Date Value Ref Range Status  06/12/2016 9.5 (L) 12.0 - 15.0 g/dL Final   HGB  Date Value Ref Range Status  05/09/2016 10.0 (L) 11.6 - 15.9 g/dL Final   HCT  Date Value Ref Range Status  06/12/2016 28.6 (L) 36.0 - 46.0 % Final  05/09/2016 30.4 (L) 34.8 - 46.6 % Final    Physical Exam:  General: alert and cooperative Lochia: appropriate Uterine Fundus: firm   Discharge Diagnoses: Term Pregnancy-delivered and PIH  Discharge Information: Date: 06/13/2016 Activity: pelvic rest Diet: routine Medications: PNV, Ibuprofen and Vicodin Condition: stable Instructions: refer to practice specific booklet Discharge to: home Follow-up Information    Levi AlandANDERSON,Danon Lograsso E, MD Follow up in 1 month(s).   Specialty:  Obstetrics and Gynecology Contact information: [address] [place] [id number] [phone]           Newborn Data: Live born female  Birth Weight: 7 lb 13.4 oz (3555 g) APGAR: 8, 9  Home with mother.  Pallavi Clifton E 06/13/2016, 8:25 AM

## 2016-06-13 NOTE — Lactation Note (Signed)
This note was copied from a baby's chart. Lactation Consultation Note: Unwrapped and undressed baby. Attempted to latch but she got very fussy on and off the breast. RN in and changes diaper I assisted mom with pumping. - has her own Spectra pump. Pumped one breast and obtained about 3 cc's transitional milk. Baby fussy again Mom fed EBM to baby with bottle, still fussy, fed formula 30 cc's and baby calm. Reviewed paced feeding. Mom's plan is to pump and bottle. Encouraged to continue latching baby as she can to help promote good milk supply initially. To pump the other breast when she gets baby settled. Pump is DEBP but did not bring all the pieces to hospital to make it a double pump. Encouraged to get larger flanges as these are too small as she continues to pump. Reviewed engorgement prevention and treatment. Comfort gels given with instructions for use. No further questions at present. Reviewed our phone number to call with questions/concerns.  Patient Name: Anna Nguyen'XToday's Date: 06/13/2016 Reason for consult: Follow-up assessment   Maternal Data Formula Feeding for Exclusion: No Has patient been taught Hand Expression?: Yes Does the patient have breastfeeding experience prior to this delivery?: No  Feeding Feeding Type: Breast Fed Length of feed: 3 min  LATCH Score/Interventions Latch: Repeated attempts needed to sustain latch, nipple held in mouth throughout feeding, stimulation needed to elicit sucking reflex.  Audible Swallowing: None  Type of Nipple: Everted at rest and after stimulation  Comfort (Breast/Nipple): Filling, red/small blisters or bruises, mild/mod discomfort  Problem noted: Mild/Moderate discomfort Interventions (Mild/moderate discomfort): Comfort gels;Hand expression  Hold (Positioning): Assistance needed to correctly position infant at breast and maintain latch. Intervention(s): Breastfeeding basics reviewed  LATCH Score: 5  Lactation Tools  Discussed/Used     Consult Status Consult Status: Complete Date: 06/13/16 Follow-up type: In-patient    Pamelia HoitWeeks, Jisell Majer D 06/13/2016, 9:34 AM

## 2016-12-03 LAB — OB RESULTS CONSOLE RPR: RPR: NONREACTIVE

## 2016-12-03 LAB — OB RESULTS CONSOLE RUBELLA ANTIBODY, IGM: Rubella: IMMUNE

## 2016-12-03 LAB — OB RESULTS CONSOLE GC/CHLAMYDIA
Chlamydia: NEGATIVE
GC PROBE AMP, GENITAL: NEGATIVE

## 2016-12-03 LAB — OB RESULTS CONSOLE ANTIBODY SCREEN: Antibody Screen: NEGATIVE

## 2016-12-03 LAB — OB RESULTS CONSOLE HEPATITIS B SURFACE ANTIGEN: Hepatitis B Surface Ag: NEGATIVE

## 2016-12-03 LAB — OB RESULTS CONSOLE ABO/RH: RH Type: POSITIVE

## 2016-12-03 LAB — OB RESULTS CONSOLE HIV ANTIBODY (ROUTINE TESTING): HIV: NONREACTIVE

## 2017-02-09 ENCOUNTER — Inpatient Hospital Stay (HOSPITAL_COMMUNITY)
Admission: AD | Admit: 2017-02-09 | Discharge: 2017-02-09 | Disposition: A | Discharge status: Home / Self Care | Payer: 59 | Source: Ambulatory Visit | Attending: Obstetrics and Gynecology | Admitting: Obstetrics and Gynecology

## 2017-02-09 ENCOUNTER — Encounter (HOSPITAL_COMMUNITY): Payer: Self-pay | Admitting: *Deleted

## 2017-02-09 DIAGNOSIS — A09 Infectious gastroenteritis and colitis, unspecified: Secondary | ICD-10-CM | POA: Insufficient documentation

## 2017-02-09 DIAGNOSIS — O24912 Unspecified diabetes mellitus in pregnancy, second trimester: Secondary | ICD-10-CM | POA: Insufficient documentation

## 2017-02-09 DIAGNOSIS — O162 Unspecified maternal hypertension, second trimester: Secondary | ICD-10-CM | POA: Insufficient documentation

## 2017-02-09 DIAGNOSIS — O98812 Other maternal infectious and parasitic diseases complicating pregnancy, second trimester: Secondary | ICD-10-CM | POA: Diagnosis not present

## 2017-02-09 DIAGNOSIS — R112 Nausea with vomiting, unspecified: Secondary | ICD-10-CM | POA: Diagnosis present

## 2017-02-09 DIAGNOSIS — D509 Iron deficiency anemia, unspecified: Secondary | ICD-10-CM | POA: Diagnosis not present

## 2017-02-09 DIAGNOSIS — O98912 Unspecified maternal infectious and parasitic disease complicating pregnancy, second trimester: Secondary | ICD-10-CM | POA: Insufficient documentation

## 2017-02-09 DIAGNOSIS — O9989 Other specified diseases and conditions complicating pregnancy, childbirth and the puerperium: Secondary | ICD-10-CM | POA: Diagnosis not present

## 2017-02-09 DIAGNOSIS — Z3A16 16 weeks gestation of pregnancy: Secondary | ICD-10-CM | POA: Insufficient documentation

## 2017-02-09 DIAGNOSIS — O3482 Maternal care for other abnormalities of pelvic organs, second trimester: Secondary | ICD-10-CM | POA: Insufficient documentation

## 2017-02-09 DIAGNOSIS — R197 Diarrhea, unspecified: Secondary | ICD-10-CM | POA: Diagnosis present

## 2017-02-09 DIAGNOSIS — E282 Polycystic ovarian syndrome: Secondary | ICD-10-CM | POA: Diagnosis not present

## 2017-02-09 DIAGNOSIS — O99012 Anemia complicating pregnancy, second trimester: Secondary | ICD-10-CM | POA: Diagnosis not present

## 2017-02-09 DIAGNOSIS — Z794 Long term (current) use of insulin: Secondary | ICD-10-CM | POA: Diagnosis not present

## 2017-02-09 LAB — URINALYSIS, ROUTINE W REFLEX MICROSCOPIC
BILIRUBIN URINE: NEGATIVE
GLUCOSE, UA: NEGATIVE mg/dL
HGB URINE DIPSTICK: NEGATIVE
Ketones, ur: NEGATIVE mg/dL
NITRITE: NEGATIVE
PH: 5 (ref 5.0–8.0)
Protein, ur: NEGATIVE mg/dL
SPECIFIC GRAVITY, URINE: 1.028 (ref 1.005–1.030)

## 2017-02-09 LAB — COMPREHENSIVE METABOLIC PANEL
ALK PHOS: 63 U/L (ref 38–126)
ALT: 8 U/L — ABNORMAL LOW (ref 14–54)
ANION GAP: 6 (ref 5–15)
AST: 14 U/L — ABNORMAL LOW (ref 15–41)
Albumin: 3.5 g/dL (ref 3.5–5.0)
BILIRUBIN TOTAL: 0.6 mg/dL (ref 0.3–1.2)
BUN: 8 mg/dL (ref 6–20)
CALCIUM: 8.8 mg/dL — AB (ref 8.9–10.3)
CO2: 22 mmol/L (ref 22–32)
Chloride: 106 mmol/L (ref 101–111)
Creatinine, Ser: 0.46 mg/dL (ref 0.44–1.00)
GLUCOSE: 89 mg/dL (ref 65–99)
Potassium: 3.6 mmol/L (ref 3.5–5.1)
Sodium: 134 mmol/L — ABNORMAL LOW (ref 135–145)
TOTAL PROTEIN: 7 g/dL (ref 6.5–8.1)

## 2017-02-09 LAB — CBC
HCT: 32.1 % — ABNORMAL LOW (ref 36.0–46.0)
HEMOGLOBIN: 10.9 g/dL — AB (ref 12.0–15.0)
MCH: 29.7 pg (ref 26.0–34.0)
MCHC: 34 g/dL (ref 30.0–36.0)
MCV: 87.5 fL (ref 78.0–100.0)
Platelets: 152 10*3/uL (ref 150–400)
RBC: 3.67 MIL/uL — AB (ref 3.87–5.11)
RDW: 14 % (ref 11.5–15.5)
WBC: 8.2 10*3/uL (ref 4.0–10.5)

## 2017-02-09 LAB — LIPASE, BLOOD: LIPASE: 21 U/L (ref 11–51)

## 2017-02-09 MED ORDER — RANITIDINE HCL 150 MG PO CAPS: 150.0000 mg | ORAL_CAPSULE | Freq: Two times a day (BID) | ORAL | 1 refills | Status: DC

## 2017-02-09 MED ORDER — PROMETHAZINE HCL 25 MG PO TABS: 25.0000 mg | ORAL_TABLET | Freq: Four times a day (QID) | ORAL | 0 refills | Status: DC | PRN

## 2017-02-09 MED ORDER — METOCLOPRAMIDE HCL 5 MG/ML IJ SOLN
10.0000 mg | Freq: Once | INTRAMUSCULAR | Status: AC
  Administered 2017-02-09: 10 mg via INTRAVENOUS
  Filled 2017-02-09: qty 2

## 2017-02-09 MED ORDER — ONDANSETRON 4 MG PO TBDP
4.0000 mg | ORAL_TABLET | Freq: Three times a day (TID) | ORAL | 0 refills | Status: DC | PRN
Start: 2017-02-09 — End: 2017-03-16

## 2017-02-09 MED ORDER — DEXTROSE 5 % IN LACTATED RINGERS IV BOLUS
1000.0000 mL | Freq: Once | INTRAVENOUS | Status: AC
  Administered 2017-02-09: 1000 mL via INTRAVENOUS

## 2017-02-09 MED ORDER — FAMOTIDINE IN NACL 20-0.9 MG/50ML-% IV SOLN
20.0000 mg | Freq: Once | INTRAVENOUS | Status: AC
  Administered 2017-02-09: 20 mg via INTRAVENOUS
  Filled 2017-02-09: qty 50

## 2017-02-09 MED ORDER — LACTATED RINGERS IV BOLUS (SEPSIS)
1000.0000 mL | Freq: Once | INTRAVENOUS | Status: AC
  Administered 2017-02-09: 1000 mL via INTRAVENOUS

## 2017-02-09 MED ORDER — HYDROMORPHONE HCL 1 MG/ML IJ SOLN
1.0000 mg | Freq: Once | INTRAMUSCULAR | Status: AC
  Administered 2017-02-09: 1 mg via INTRAVENOUS
  Filled 2017-02-09: qty 1

## 2017-02-09 MED ORDER — ONDANSETRON HCL 4 MG/2ML IJ SOLN
4.0000 mg | Freq: Once | INTRAMUSCULAR | Status: AC
  Administered 2017-02-09: 4 mg via INTRAVENOUS
  Filled 2017-02-09: qty 2

## 2017-02-09 NOTE — MAU Provider Note (Signed)
History     CSN: [id number]  Arrival date and time: 02/09/17 3606   First Provider Initiated Contact with Patient 02/09/17 [phone]      Chief Complaint  Patient presents with  . Nausea  . Emesis  . Diarrhea   HPI   Ms.Anna Nguyen is a 31 y.o. female [phone] @ 38w4dhere in MAU with possible food poisoning. She started having N.V.D this morning around 0500. Her husband has the same symptoms. Her and her husband ate [employer] last night for dinner and both feel it came from that. She is having upper abdominal pain that comes every 5 minutes, vomiting continuously since 5 am. She denies vaginal bleeding, lower abdominal pain, or leaking of water.   OB History    Gravida Para Term Preterm AB Living   '4 1 1   2 1   ' SAB TAB Ectopic Multiple Live Births     2   0 1      Past Medical History:  Diagnosis Date  . Anemia, iron deficiency   . HTN (hypertension)    with last pregnancy  . PCOS (polycystic ovarian syndrome)   . Type II or unspecified type diabetes mellitus without mention of complication, not stated as uncontrolled    not on insulin    Past Surgical History:  Procedure Laterality Date  . LAPAROSCOPIC GASTRIC BANDING    . TONSILLECTOMY      Family History  Problem Relation Age of Onset  . Arthritis Other   . Diabetes Other   . Hypertension Other   . Hyperlipidemia Other   . Prostate cancer Other   . Cancer Maternal Aunt        breast    Social History  Substance Use Topics  . Smoking status: Never Smoker  . Smokeless tobacco: Never Used  . Alcohol use No    Allergies:  Allergies  Allergen Reactions  . Aspirin Other (See Comments)    Reaction:  Unknown   . Nsaids Other (See Comments)    Reaction:  Unknown   . Latex Rash    Prescriptions Prior to Admission  Medication Sig Dispense Refill Last Dose  . hydrocodone-ibuprofen (VICOPROFEN) 5-200 MG tablet Take 1 tablet by mouth every 8 (eight) hours as needed for pain. 30 tablet 0      Results for orders placed or performed during the hospital encounter of 02/09/17 (from the past 48 hour(s))  Urinalysis, Routine w reflex microscopic     Status: Abnormal   Collection Time: 02/09/17  8:55 AM  Result Value Ref Range   Color, Urine YELLOW YELLOW   APPearance HAZY (A) CLEAR   Specific Gravity, Urine 1.028 1.005 - 1.030   pH 5.0 5.0 - 8.0   Glucose, UA NEGATIVE NEGATIVE mg/dL   Hgb urine dipstick NEGATIVE NEGATIVE   Bilirubin Urine NEGATIVE NEGATIVE   Ketones, ur NEGATIVE NEGATIVE mg/dL   Protein, ur NEGATIVE NEGATIVE mg/dL   Nitrite NEGATIVE NEGATIVE   Leukocytes, UA SMALL (A) NEGATIVE   RBC / HPF 0-5 0 - 5 RBC/hpf   WBC, UA 6-30 0 - 5 WBC/hpf   Bacteria, UA RARE (A) NONE SEEN   Squamous Epithelial / LPF 6-30 (A) NONE SEEN   Mucous PRESENT   Comprehensive metabolic panel     Status: Abnormal   Collection Time: 02/09/17 10:14 AM  Result Value Ref Range   Sodium 134 (L) 135 - 145 mmol/L   Potassium 3.6 3.5 - 5.1 mmol/L  Chloride 106 101 - 111 mmol/L   CO2 22 22 - 32 mmol/L   Glucose, Bld 89 65 - 99 mg/dL   BUN 8 6 - 20 mg/dL   Creatinine, Ser 0.46 0.44 - 1.00 mg/dL   Calcium 8.8 (L) 8.9 - 10.3 mg/dL   Total Protein 7.0 6.5 - 8.1 g/dL   Albumin 3.5 3.5 - 5.0 g/dL   AST 14 (L) 15 - 41 U/L   ALT 8 (L) 14 - 54 U/L   Alkaline Phosphatase 63 38 - 126 U/L   Total Bilirubin 0.6 0.3 - 1.2 mg/dL   GFR calc non Af Amer >60 >60 mL/min   GFR calc Af Amer >60 >60 mL/min    Comment: (NOTE) The eGFR has been calculated using the CKD EPI equation. This calculation has not been validated in all clinical situations. eGFR's persistently <60 mL/min signify possible Chronic Kidney Disease.    Anion gap 6 5 - 15  CBC     Status: Abnormal   Collection Time: 02/09/17 10:14 AM  Result Value Ref Range   WBC 8.2 4.0 - 10.5 K/uL   RBC 3.67 (L) 3.87 - 5.11 MIL/uL   Hemoglobin 10.9 (L) 12.0 - 15.0 g/dL   HCT 32.1 (L) 36.0 - 46.0 %   MCV 87.5 78.0 - 100.0 fL   MCH 29.7 26.0 -  34.0 pg   MCHC 34.0 30.0 - 36.0 g/dL   RDW 14.0 11.5 - 15.5 %   Platelets 152 150 - 400 K/uL  Lipase, blood     Status: None   Collection Time: 02/09/17 10:14 AM  Result Value Ref Range   Lipase 21 11 - 51 U/L   Review of Systems  Constitutional: Negative for fever.  Gastrointestinal: Positive for abdominal pain, diarrhea, nausea and vomiting.   Physical Exam   Blood pressure 120/67, pulse 85, temperature 97.8 F (36.6 C), resp. rate 16, height '5\' 8"'  (1.727 m), weight 237 lb (107.5 kg), last menstrual period 10/16/2016, unknown if currently breastfeeding.  Physical Exam  Constitutional: She is oriented to person, place, and time. She appears well-developed and well-nourished. She appears toxic. She has a sickly appearance. She appears ill. No distress.  Patient is tearful.   HENT:  Head: Normocephalic.  Respiratory: Effort normal.  GI: Soft. Normal appearance. There is tenderness in the epigastric area. There is rebound. There is no rigidity and no guarding.  Genitourinary:  Genitourinary Comments: Dilation: Closed Cervical Position: Posterior  Musculoskeletal: Normal range of motion.  Neurological: She is alert and oriented to person, place, and time.  Skin: Skin is warm. She is not diaphoretic.  Psychiatric: Her behavior is normal.   MAU Course  Procedures  None  MDM  + fetal heart tones via doppler  LR bolus X 1 CBC with diff & Lipase & CMP D5LR bolus  Zofran 4 mg IV Pepcid 20 mg IV Dilaudid 1 mg IV Discussed patient with Dr. Rogue Bussing, patient is feeling much better. No additional vomiting. Pain is down to 2/10 from 9/10.  Assessment and Plan    A:  1. Gastroenteritis, infectious   2. Diarrhea of infectious origin     P:  Discharge home in stable condition  Follow up with Dr. If symptoms have not improved in 72 hours  Return to MAU if symptoms worsen.  BRAT diet  Increase PO fluid    Belton Peplinski, Artist Pais, NP 02/09/2017 3:31 PM

## 2017-02-09 NOTE — MAU Note (Signed)
Pt presents to MAU with complaints of food poisoning yesterday [facility]. PT is experiencing N/V/D with reflux.

## 2017-02-09 NOTE — Discharge Instructions (Signed)
Food Choices to Help Relieve Diarrhea, Adult When you have diarrhea, the foods you eat and your eating habits are very important. Choosing the right foods and drinks can help:  Relieve diarrhea.  Replace lost fluids and nutrients.  Prevent dehydration.  What general guidelines should I follow? Relieving diarrhea  Choose foods with less than 2 g or .07 oz. of fiber per serving.  Limit fats to less than 8 tsp (38 g or 1.34 oz.) a day.  Avoid the following: ? Foods and beverages sweetened with high-fructose corn syrup, honey, or sugar alcohols such as xylitol, sorbitol, and mannitol. ? Foods that contain a lot of fat or sugar. ? Fried, greasy, or spicy foods. ? High-fiber grains, breads, and cereals. ? Raw fruits and vegetables.  Eat foods that are rich in probiotics. These foods include dairy products such as yogurt and fermented milk products. They help increase healthy bacteria in the stomach and intestines (gastrointestinal tract, or GI tract).  If you have lactose intolerance, avoid dairy products. These may make your diarrhea worse.  Take medicine to help stop diarrhea (antidiarrheal medicine) only as told by your health care provider. Replacing nutrients  Eat small meals or snacks every 3-4 hours.  Eat bland foods, such as white rice, toast, or baked potato, until your diarrhea starts to get better. Gradually reintroduce nutrient-rich foods as tolerated or as told by your health care provider. This includes: ? Well-cooked protein foods. ? Peeled, seeded, and soft-cooked fruits and vegetables. ? Low-fat dairy products.  Take vitamin and mineral supplements as told by your health care provider. Preventing dehydration   Start by sipping water or a special solution to prevent dehydration (oral rehydration solution, ORS). Urine that is clear or pale yellow means that you are getting enough fluid.  Try to drink at least 8-10 cups of fluid each day to help replace lost  fluids.  You may add other liquids in addition to water, such as clear juice or decaffeinated sports drinks, as tolerated or as told by your health care provider.  Avoid drinks with caffeine, such as coffee, tea, or soft drinks.  Avoid alcohol. What foods are recommended? The items listed may not be a complete list. Talk with your health care provider about what dietary choices are best for you. Grains White rice. White, [place], or pita breads (fresh or toasted), including plain rolls, buns, or bagels. White pasta. Saltine, soda, or graham crackers. Pretzels. Low-fiber cereal. Cooked cereals made with water (such as cornmeal, farina, or cream cereals). Plain muffins. Matzo. Melba toast. Zwieback. Vegetables Potatoes (without the skin). Most well-cooked and canned vegetables without skins or seeds. Tender lettuce. Fruits Apple sauce. Fruits canned in juice. Cooked apricots, cherries, grapefruit, peaches, pears, or plums. Fresh bananas and cantaloupe. Meats and other protein foods Baked or boiled chicken. Eggs. Tofu. Fish. Seafood. Smooth nut butters. Ground or well-cooked tender beef, ham, veal, lamb, pork, or poultry. Dairy Plain yogurt, kefir, and unsweetened liquid yogurt. Lactose-free milk, buttermilk, skim milk, or soy milk. Low-fat or nonfat hard cheese. Beverages Water. Low-calorie sports drinks. Fruit juices without pulp. Strained tomato and vegetable juices. Decaffeinated teas. Sugar-free beverages not sweetened with sugar alcohols. Oral rehydration solutions, if approved by your health care provider. Seasoning and other foods Bouillon, broth, or soups made from recommended foods. What foods are not recommended? The items listed may not be a complete list. Talk with your health care provider about what dietary choices are best for you. Grains Whole grain, whole wheat,  bran, or rye breads, rolls, pastas, and crackers. Wild or brown rice. Whole grain or bran cereals. Barley. Oats and  oatmeal. Corn tortillas or taco shells. Granola. Popcorn. Vegetables Raw vegetables. Fried vegetables. Cabbage, broccoli, Brussels sprouts, artichokes, baked beans, beet greens, corn, kale, legumes, peas, sweet potatoes, and yams. Potato skins. Cooked spinach and cabbage. Fruits Dried fruit, including raisins and dates. Raw fruits. Stewed or dried prunes. Canned fruits with syrup. Meat and other protein foods Fried or fatty meats. Deli meats. Chunky nut butters. Nuts and seeds. Beans and lentils. Tomasa BlaseBacon. Hot dogs. Sausage. Dairy High-fat cheeses. Whole milk, chocolate milk, and beverages made with milk, such as milk shakes. Half-and-half. Cream. sour cream. Ice cream. Beverages Caffeinated beverages (such as coffee, tea, soda, or energy drinks). Alcoholic beverages. Fruit juices with pulp. Prune juice. Soft drinks sweetened with high-fructose corn syrup or sugar alcohols. High-calorie sports drinks. Fats and oils Butter. Cream sauces. Margarine. Salad oils. Plain salad dressings. Olives. Avocados. Mayonnaise. Sweets and desserts Sweet rolls, doughnuts, and sweet breads. Sugar-free desserts sweetened with sugar alcohols such as xylitol and sorbitol. Seasoning and other foods Honey. Hot sauce. Chili powder. Gravy. Cream-based or milk-based soups. Pancakes and waffles. Summary  When you have diarrhea, the foods you eat and your eating habits are very important.  Make sure you get at least 8-10 cups of fluid each day, or enough to keep your urine clear or pale yellow.  Eat bland foods and gradually reintroduce healthy, nutrient-rich foods as tolerated, or as told by your health care provider.  Avoid high-fiber, fried, greasy, or spicy foods. This information is not intended to replace advice given to you by your health care provider. Make sure you discuss any questions you have with your health care provider. Document Released: 11/15/2003 Document Revised: 08/22/2016 Document Reviewed:  08/22/2016 Elsevier Interactive Patient Education  2017 Elsevier Inc.   Diarrhea, Adult Diarrhea is frequent loose and watery bowel movements. Diarrhea can make you feel weak and cause you to become dehydrated. Dehydration can make you tired and thirsty, cause you to have a dry mouth, and decrease how often you urinate. Diarrhea typically lasts 2-3 days. However, it can last longer if it is a sign of something more serious. It is important to treat your diarrhea as told by your health care provider. Follow these instructions at home: Eating and drinking  Follow these recommendations as told by your health care provider:  Take an oral rehydration solution (ORS). This is a drink that is sold at pharmacies and retail stores.  Drink clear fluids, such as water, ice chips, diluted fruit juice, and low-calorie sports drinks.  Eat bland, easy-to-digest foods in small amounts as you are able. These foods include bananas, applesauce, rice, lean meats, toast, and crackers.  Avoid drinking fluids that contain a lot of sugar or caffeine, such as energy drinks, sports drinks, and soda.  Avoid alcohol.  Avoid spicy or fatty foods.  General instructions  Drink enough fluid to keep your urine clear or pale yellow.  Wash your hands often. If soap and water are not available, use hand sanitizer.  Make sure that all people in your household wash their hands well and often.  Take over-the-counter and prescription medicines only as told by your health care provider.  Rest at home while you recover.  Watch your condition for any changes.  Take a warm bath to relieve any burning or pain from frequent diarrhea episodes.  Keep all follow-up visits as told by your health  care provider. This is important. Contact a health care provider if:  You have a fever.  Your diarrhea gets worse.  You have new symptoms.  You cannot keep fluids down.  You feel light-headed or dizzy.  You have a  headache  You have muscle cramps. Get help right away if:  You have chest pain.  You feel extremely weak or you faint.  You have bloody or black stools or stools that look like tar.  You have severe pain, cramping, or bloating in your abdomen.  You have trouble breathing or you are breathing very quickly.  Your heart is beating very quickly.  Your skin feels cold and clammy.  You feel confused.  You have signs of dehydration, such as: ? Dark urine, very little urine, or no urine. ? Cracked lips. ? Dry mouth. ? Sunken eyes. ? Sleepiness. ? Weakness. This information is not intended to replace advice given to you by your health care provider. Make sure you discuss any questions you have with your health care provider. Document Released: 08/15/2002 Document Revised: 01/03/2016 Document Reviewed: 05/01/2015 Elsevier Interactive Patient Education  2017 Elsevier Inc.  Viral Gastroenteritis, Adult Viral gastroenteritis is also known as the stomach flu. This condition is caused by certain germs (viruses). These germs can be passed from person to person very easily (are very contagious). This condition can cause sudden watery poop (diarrhea), fever, and throwing up (vomiting). Having watery poop and throwing up can make you feel weak and cause you to get dehydrated. Dehydration can make you tired and thirsty, make you have a dry mouth, and make it so you pee (urinate) less often. Older adults and people with other diseases or a weak defense system (immune system) are at higher risk for dehydration. It is important to replace the fluids that you lose from having watery poop and throwing up. Follow these instructions at home: Follow instructions from your doctor about how to care for yourself at home. Eating and drinking  Follow these instructions as told by your doctor:  Take an oral rehydration solution (ORS). This is a drink that is sold at pharmacies and stores.  Drink clear  fluids in small amounts as you are able, such as: ? Water. ? Ice chips. ? Diluted fruit juice. ? Low-calorie sports drinks.  Eat bland, easy-to-digest foods in small amounts as you are able, such as: ? Bananas. ? Applesauce. ? Rice. ? Low-fat (lean) meats. ? Toast. ? Crackers.  Avoid fluids that have a lot of sugar or caffeine in them.  Avoid alcohol.  Avoid spicy or fatty foods.  General instructions  Drink enough fluid to keep your pee (urine) clear or pale yellow.  Wash your hands often. If you cannot use soap and water, use hand sanitizer.  Make sure that all people in your home wash their hands well and often.  Rest at home while you get better.  Take over-the-counter and prescription medicines only as told by your doctor.  Watch your condition for any changes.  Take a warm bath to help with any burning or pain from having watery poop.  Keep all follow-up visits as told by your doctor. This is important. Contact a doctor if:  You cannot keep fluids down.  Your symptoms get worse.  You have new symptoms.  You feel light-headed or dizzy.  You have muscle cramps. Get help right away if:  You have chest pain.  You feel very weak or you pass out (faint).  You  see blood in your throw-up.  Your throw-up looks like coffee grounds.  You have bloody or black poop (stools) or poop that look like tar.  You have a very bad headache, a stiff neck, or both.  You have a rash.  You have very bad pain, cramping, or bloating in your belly (abdomen).  You have trouble breathing.  You are breathing very quickly.  Your heart is beating very quickly.  Your skin feels cold and clammy.  You feel confused.  You have pain when you pee.  You have signs of dehydration, such as: ? Dark pee, hardly any pee, or no pee. ? Cracked lips. ? Dry mouth. ? Sunken eyes. ? Sleepiness. ? Weakness. This information is not intended to replace advice given to you by your  health care provider. Make sure you discuss any questions you have with your health care provider. Document Released: 02/11/2008 Document Revised: 03/14/2016 Document Reviewed: 05/01/2015 Elsevier Interactive Patient Education  2017 [employer].

## 2017-03-12 ENCOUNTER — Emergency Department (HOSPITAL_COMMUNITY)
Admission: EM | Admit: 2017-03-12 | Discharge: 2017-03-13 | Disposition: A | Discharge status: Other Acute Inpatient Hospital | Payer: 59 | Attending: Emergency Medicine | Admitting: Emergency Medicine

## 2017-03-12 ENCOUNTER — Encounter (HOSPITAL_COMMUNITY): Payer: Self-pay | Admitting: Emergency Medicine

## 2017-03-12 DIAGNOSIS — X789XXA Intentional self-harm by unspecified sharp object, initial encounter: Secondary | ICD-10-CM | POA: Diagnosis not present

## 2017-03-12 DIAGNOSIS — S61511A Laceration without foreign body of right wrist, initial encounter: Secondary | ICD-10-CM

## 2017-03-12 DIAGNOSIS — Y929 Unspecified place or not applicable: Secondary | ICD-10-CM | POA: Insufficient documentation

## 2017-03-12 DIAGNOSIS — Y999 Unspecified external cause status: Secondary | ICD-10-CM | POA: Diagnosis not present

## 2017-03-12 DIAGNOSIS — I10 Essential (primary) hypertension: Secondary | ICD-10-CM | POA: Insufficient documentation

## 2017-03-12 DIAGNOSIS — S61512A Laceration without foreign body of left wrist, initial encounter: Secondary | ICD-10-CM | POA: Diagnosis not present

## 2017-03-12 DIAGNOSIS — Z91040 Latex allergy status: Secondary | ICD-10-CM | POA: Insufficient documentation

## 2017-03-12 DIAGNOSIS — Z3A21 21 weeks gestation of pregnancy: Secondary | ICD-10-CM | POA: Diagnosis not present

## 2017-03-12 DIAGNOSIS — E1165 Type 2 diabetes mellitus with hyperglycemia: Secondary | ICD-10-CM | POA: Diagnosis not present

## 2017-03-12 DIAGNOSIS — O26892 Other specified pregnancy related conditions, second trimester: Secondary | ICD-10-CM | POA: Diagnosis not present

## 2017-03-12 DIAGNOSIS — T1491XA Suicide attempt, initial encounter: Secondary | ICD-10-CM | POA: Diagnosis not present

## 2017-03-12 DIAGNOSIS — Y939 Activity, unspecified: Secondary | ICD-10-CM | POA: Insufficient documentation

## 2017-03-12 DIAGNOSIS — F332 Major depressive disorder, recurrent severe without psychotic features: Secondary | ICD-10-CM | POA: Diagnosis present

## 2017-03-12 DIAGNOSIS — R197 Diarrhea, unspecified: Secondary | ICD-10-CM

## 2017-03-12 LAB — CBC
HCT: 30.4 % — ABNORMAL LOW (ref 36.0–46.0)
HEMOGLOBIN: 10 g/dL — AB (ref 12.0–15.0)
MCH: 28.5 pg (ref 26.0–34.0)
MCHC: 32.9 g/dL (ref 30.0–36.0)
MCV: 86.6 fL (ref 78.0–100.0)
Platelets: 172 10*3/uL (ref 150–400)
RBC: 3.51 MIL/uL — AB (ref 3.87–5.11)
RDW: 13.6 % (ref 11.5–15.5)
WBC: 9.1 10*3/uL (ref 4.0–10.5)

## 2017-03-12 LAB — ACETAMINOPHEN LEVEL

## 2017-03-12 LAB — COMPREHENSIVE METABOLIC PANEL
ALBUMIN: 3.4 g/dL — AB (ref 3.5–5.0)
ALT: 9 U/L — ABNORMAL LOW (ref 14–54)
ANION GAP: 9 (ref 5–15)
AST: 14 U/L — ABNORMAL LOW (ref 15–41)
Alkaline Phosphatase: 68 U/L (ref 38–126)
BUN: <5 mg/dL — ABNORMAL LOW (ref 6–20)
CALCIUM: 8.8 mg/dL — AB (ref 8.9–10.3)
CHLORIDE: 109 mmol/L (ref 101–111)
CO2: 20 mmol/L — AB (ref 22–32)
Creatinine, Ser: 0.46 mg/dL (ref 0.44–1.00)
GFR calc non Af Amer: >60 mL/min (ref >60)
GLUCOSE: 97 mg/dL (ref 65–99)
POTASSIUM: 3.2 mmol/L — AB (ref 3.5–5.1)
SODIUM: 138 mmol/L (ref 135–145)
Total Bilirubin: 0.3 mg/dL (ref 0.3–1.2)
Total Protein: 7.1 g/dL (ref 6.5–8.1)

## 2017-03-12 LAB — C DIFFICILE QUICK SCREEN W PCR REFLEX
C DIFFICILE (CDIFF) INTERP: NOT DETECTED
C DIFFICILE (CDIFF) TOXIN: NEGATIVE
C Diff antigen: NEGATIVE

## 2017-03-12 LAB — RAPID URINE DRUG SCREEN, HOSP PERFORMED
Amphetamines: NOT DETECTED
BENZODIAZEPINES: NOT DETECTED
Barbiturates: NOT DETECTED
COCAINE: NOT DETECTED
OPIATES: NOT DETECTED
TETRAHYDROCANNABINOL: POSITIVE — AB

## 2017-03-12 LAB — URINALYSIS, ROUTINE W REFLEX MICROSCOPIC
Bilirubin Urine: NEGATIVE
GLUCOSE, UA: NEGATIVE mg/dL
HGB URINE DIPSTICK: NEGATIVE
KETONES UR: 20 mg/dL — AB
LEUKOCYTES UA: NEGATIVE
Nitrite: NEGATIVE
PH: 5 (ref 5.0–8.0)
Protein, ur: NEGATIVE mg/dL
Specific Gravity, Urine: 1.019 (ref 1.005–1.030)

## 2017-03-12 LAB — ETHANOL: Alcohol, Ethyl (B): <5 mg/dL (ref <5)

## 2017-03-12 LAB — HCG, QUANTITATIVE, PREGNANCY: hCG, Beta Chain, Quant, S: 4581 m[IU]/mL — ABNORMAL HIGH (ref <5)

## 2017-03-12 LAB — CBG MONITORING, ED: Glucose-Capillary: 98 mg/dL (ref 65–99)

## 2017-03-12 LAB — SALICYLATE LEVEL

## 2017-03-12 MED ORDER — BACITRACIN ZINC 500 UNIT/GM EX OINT
TOPICAL_OINTMENT | CUTANEOUS | Status: DC | PRN
  Administered 2017-03-13: 1 via TOPICAL
  Filled 2017-03-12: qty 28.35

## 2017-03-12 MED ORDER — ACETAMINOPHEN 325 MG PO TABS
650.0000 mg | ORAL_TABLET | ORAL | Status: DC | PRN
  Administered 2017-03-12: 650 mg via ORAL
  Filled 2017-03-12 (×2): qty 2

## 2017-03-12 MED ORDER — LIDOCAINE-EPINEPHRINE (PF) 2 %-1:200000 IJ SOLN
10.0000 mL | Freq: Once | INTRAMUSCULAR | Status: AC
  Administered 2017-03-12: 1 mL via INTRADERMAL

## 2017-03-12 MED ORDER — LIDOCAINE-EPINEPHRINE (PF) 2 %-1:200000 IJ SOLN
INTRAMUSCULAR | Status: AC
  Administered 2017-03-12: 1 mL via INTRADERMAL
  Filled 2017-03-12: qty 20

## 2017-03-12 MED ORDER — POTASSIUM CHLORIDE CRYS ER 20 MEQ PO TBCR
40.0000 meq | EXTENDED_RELEASE_TABLET | Freq: Once | ORAL | Status: AC
  Administered 2017-03-12: 40 meq via ORAL
  Filled 2017-03-12: qty 2

## 2017-03-12 MED ORDER — DIPHENHYDRAMINE HCL 25 MG PO CAPS
50.0000 mg | ORAL_CAPSULE | Freq: Every evening | ORAL | Status: DC | PRN
  Administered 2017-03-12: 50 mg via ORAL
  Filled 2017-03-12: qty 2

## 2017-03-12 NOTE — ED Triage Notes (Addendum)
Pt [redacted] weeks pregnant cut her wrists today at about 1500, found by husband shortly after. Pt reports this was attempted suicide. Denies past suicide attempts, depression, mental health disorders, being treated for mental health disorders, or psychiatric medications. States that she had "too much" going on and it was making her anxious, which caused her to want to kill herself. Denies HI/AVH, drug use. Pt also reports feeling headache, nausea, back pain x several days, not related to suicide attempt. [facility], is excited about pregnancy, no complications with pregnancy. Laceration to bilateral wrists, perpendicular to radial bone. Bleeding controlled. Irrigated. Some tingling to right fingers. No tingling to left fingers.
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
## 2017-03-12 NOTE — ED Notes (Signed)
TTS assessment in progress. 

## 2017-03-12 NOTE — ED Notes (Signed)
Pt stated "I have never felt like this before.  I was taking Zoloft because I had a difficult pregnancy last time.  I was feeling bad and I got up to a level and couldn't get myself back down.  I blacked out and my wrists were cut."
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
## 2017-03-12 NOTE — ED Notes (Signed)
Pt requesting "something for sleep".   Attempted to notify Dr. Clarene DukeLittle of request.  Dr. Clarene DukeLittle unavailable.  Spoke with Dr. Criss AlvineGoldston & recommended to call back.

## 2017-03-12 NOTE — ED Provider Notes (Signed)
[facility]-EMERGENCY DEPT Provider Note   CSN: [id number] Arrival date & time: 03/12/17  1534     History   Chief Complaint Chief Complaint  Patient presents with  . Suicidal  . Extremity Laceration    HPI Anna Nguyen is a 31 y.o. female.  31 year old female currently [redacted] weeks pregnant with history of hypertension, PCO S, type 2 diabetes mellitus, depression who presents with suicide attempt. Patient reports that around 3 PM today, she cut both of her wrists in a suicide attempt. Her husband found her shortly afterwards. She reports a history of depression and currently takes sertraline, no previous history of suicide attempt or hospitalization. She states that she has "too much going on" and has been anxious. She states that she has had an SI over the past 24 hours due to some relationship issues. No HI/AVH, no drug use.  She is followed by an OBGYN for her pregnancy and denies any pregnancy related complaints including no abdominal pain, vaginal bleeding, passage of fluid, or vomiting. She does report some nausea and has had nonbloody diarrhea for a few days. No sick contacts or recent antibiotic use. She had a similar episode of diarrhea about a month ago.   The history is provided by the patient and the spouse.    Past Medical History:  Diagnosis Date  . Anemia, iron deficiency   . HTN (hypertension)    with last pregnancy  . PCOS (polycystic ovarian syndrome)   . Type II or unspecified type diabetes mellitus without mention of complication, not stated as uncontrolled    not on insulin    Patient Active Problem List   Diagnosis Date Noted  . Pregnancy 06/10/2016  . Severe hyperemesis gravidarum 03/25/2016  . Severe nausea and vomiting 01/16/2016  . Anemia affecting pregnancy in second trimester 01/16/2016  . 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  . Hyperglycemia 08/22/2013  . Vitamin D deficiency 07/15/2013  . PCOS (polycystic ovarian syndrome) 02/28/2013  . B12  deficiency anemia 07/14/2012  . Folate deficiency anemia 07/14/2012  . Contraception management 05/06/2011  . Gastric bypass status for obesity 05/06/2011  . Iron deficiency anemia 10/02/2009    Past Surgical History:  Procedure Laterality Date  . LAPAROSCOPIC GASTRIC BANDING    . TONSILLECTOMY      OB History    Gravida Para Term Preterm AB Living   4 1 1   2 1    SAB TAB Ectopic Multiple Live Births     2   0 1       Home Medications    Prior to Admission medications   Medication Sig Start Date End Date Taking? Authorizing Provider  promethazine (PHENERGAN) 25 MG tablet Take 1 tablet (25 mg total) by mouth every 6 (six) hours as needed for nausea or vomiting. 02/09/17  Yes Rasch, Harolyn Rutherford, NP  sertraline (ZOLOFT) 50 MG tablet TK 1 T PO QD 02/28/17  Yes [provider]  ondansetron (ZOFRAN ODT) 4 MG disintegrating tablet Take 1 tablet (4 mg total) by mouth every 8 (eight) hours as needed for nausea or vomiting. Patient not taking: Reported on 03/12/2017 02/09/17   Rasch, Victorino Dike I, NP  ranitidine (ZANTAC) 150 MG capsule Take 1 capsule (150 mg total) by mouth 2 (two) times daily. Patient not taking: Reported on 03/12/2017 02/09/17   Rasch, Harolyn Rutherford, NP    Family History Family History  Problem Relation Age of Onset  . Arthritis Other   . Diabetes Other   .  Hypertension Other   . Hyperlipidemia Other   . Prostate cancer Other   . Cancer Maternal Aunt        breast    Social History Social History  Substance Use Topics  . Smoking status: Never Smoker  . Smokeless tobacco: Never Used  . Alcohol use No     Allergies   Aspirin; Nsaids; and Latex   Review of Systems Review of Systems All other systems reviewed and are negative except that which was mentioned in HPI  Physical Exam Updated Vital Signs BP 127/84 (BP Location: Left Arm)   Pulse 95   Temp (!) 97.5 F (36.4 C) (Oral)   Resp 16   LMP 10/16/2016   SpO2 99%   Physical Exam  Constitutional:  She is oriented to person, place, and time. She appears well-developed and well-nourished. No distress.  HENT:  Head: Normocephalic and atraumatic.  Moist mucous membranes  Eyes: Conjunctivae are normal. Pupils are equal, round, and reactive to light.  Neck: Neck supple.  Cardiovascular: Normal rate, regular rhythm and normal heart sounds.   No murmur heard. Pulmonary/Chest: Effort normal and breath sounds normal.  Abdominal: Soft. Bowel sounds are normal. She exhibits no distension. There is no tenderness.  Uterus palpable at umbilicus  Musculoskeletal: She exhibits no edema.  Neurological: She is alert and oriented to person, place, and time. No sensory deficit.  Fluent speech  Skin: Skin is warm and dry.  Superficial 3cm lacerations horizontally across bilateral wrists, no active bleeding, small area of mild gaping in center of R wrist lac  Psychiatric:  Depressed mood, flat affect, withdrawn, avoids eye contact  Nursing note and vitals reviewed.    ED Treatments / Results  Labs (all labs ordered are listed, but only abnormal results are displayed) Labs Reviewed  CBC - Abnormal; Notable for the following:       Result Value   RBC 3.51 (*)    Hemoglobin 10.0 (*)    HCT 30.4 (*)    All other components within normal limits  COMPREHENSIVE METABOLIC PANEL  ETHANOL  SALICYLATE LEVEL  ACETAMINOPHEN LEVEL  RAPID URINE DRUG SCREEN, HOSP PERFORMED  URINALYSIS, ROUTINE W REFLEX MICROSCOPIC  HCG, QUANTITATIVE, PREGNANCY  CBG MONITORING, ED    EKG  EKG Interpretation None       Radiology No results found.  Procedures .Marland Kitchen.Laceration Repair Date/Time: 03/12/2017 5:45 PM Performed by: Laurence SpatesLITTLE, Haden Cavenaugh MORGAN Authorized by: Laurence SpatesLITTLE, Savvas Roper MORGAN   Consent:    Consent obtained:  Verbal   Consent given by:  Patient   Risks discussed:  Poor wound healing, pain and poor cosmetic result   Alternatives discussed:  No treatment Anesthesia (see MAR for exact dosages):     Anesthesia method:  Local infiltration   Local anesthetic:  Lidocaine 2% WITH epi Laceration details:    Location:  Shoulder/arm   Shoulder/arm location:  R lower arm   Length (cm):  2 Repair type:    Repair type:  Simple Pre-procedure details:    Preparation:  Patient was prepped and draped in usual sterile fashion Treatment:    Area cleansed with:  Betadine   Amount of cleaning:  Standard Skin repair:    Repair method:  Sutures   Suture size:  4-0   Suture material:  Nylon   Suture technique:  Simple interrupted   Number of sutures:  1 Approximation:    Approximation:  Close   Vermilion border: well-aligned   Post-procedure details:    Dressing:  Adhesive bandage and antibiotic ointment   Patient tolerance of procedure:  Tolerated well, no immediate complications Comments:     Only central portion of wound was gaping, lateral edges were superficial.   (including critical care time)  Medications Ordered in ED Medications  lidocaine-EPINEPHrine (XYLOCAINE W/EPI) 2 %-1:200000 (PF) injection 10 mL (not administered)  lidocaine-EPINEPHrine (XYLOCAINE W/EPI) 2 %-1:200000 (PF) injection (not administered)  bacitracin ointment (not administered)     Initial Impression / Assessment and Plan / ED Course  I have reviewed the triage vital signs and the nursing notes.  Pertinent labs that were available during my care of the patient were reviewed by me and considered in my medical decision making (see chart for details).    PT p/w b/l wrist lacerations in apparent suicide attempt. No active bleeding on exam, both lacerations were superficial but she did have a small area of gaping on right. I discussed options including healing secondarily or putting one stitch in the center of the laceration. Explained risks and benefits above, patient elected to suture. See procedure note. I discussed with patient and husband follow up for suture removal in routine wound care instructions.  Contacted TTS. The patient's lab work is reassuring, mildly low potassium at 3.2 for which I gave him oral potassium repletion. She is otherwise medically clear for psychiatric evaluation. I anticipate admission for treatment.  Final Clinical Impressions(s) / ED Diagnoses   Final diagnoses:  None    New Prescriptions New Prescriptions   No medications on file     Broghan Pannone, Ambrose Finland, MD 03/12/17 1747

## 2017-03-13 ENCOUNTER — Inpatient Hospital Stay (HOSPITAL_COMMUNITY)
Admission: AD | Admit: 2017-03-13 | Discharge: 2017-03-16 | DRG: 781 | Disposition: A | Discharge status: Home / Self Care | Payer: 59 | Source: Intra-hospital | Attending: Psychiatry | Admitting: Psychiatry

## 2017-03-13 ENCOUNTER — Encounter (HOSPITAL_COMMUNITY): Payer: Self-pay | Admitting: *Deleted

## 2017-03-13 DIAGNOSIS — O99842 Bariatric surgery status complicating pregnancy, second trimester: Secondary | ICD-10-CM | POA: Diagnosis present

## 2017-03-13 DIAGNOSIS — Z818 Family history of other mental and behavioral disorders: Secondary | ICD-10-CM | POA: Diagnosis not present

## 2017-03-13 DIAGNOSIS — Z803 Family history of malignant neoplasm of breast: Secondary | ICD-10-CM | POA: Diagnosis not present

## 2017-03-13 DIAGNOSIS — Z91040 Latex allergy status: Secondary | ICD-10-CM

## 2017-03-13 DIAGNOSIS — E282 Polycystic ovarian syndrome: Secondary | ICD-10-CM | POA: Diagnosis present

## 2017-03-13 DIAGNOSIS — S61511A Laceration without foreign body of right wrist, initial encounter: Secondary | ICD-10-CM

## 2017-03-13 DIAGNOSIS — T1491XA Suicide attempt, initial encounter: Secondary | ICD-10-CM

## 2017-03-13 DIAGNOSIS — X789XXA Intentional self-harm by unspecified sharp object, initial encounter: Secondary | ICD-10-CM

## 2017-03-13 DIAGNOSIS — Z886 Allergy status to analgesic agent status: Secondary | ICD-10-CM | POA: Diagnosis not present

## 2017-03-13 DIAGNOSIS — O162 Unspecified maternal hypertension, second trimester: Secondary | ICD-10-CM | POA: Diagnosis present

## 2017-03-13 DIAGNOSIS — O24912 Unspecified diabetes mellitus in pregnancy, second trimester: Secondary | ICD-10-CM | POA: Diagnosis present

## 2017-03-13 DIAGNOSIS — O99282 Endocrine, nutritional and metabolic diseases complicating pregnancy, second trimester: Secondary | ICD-10-CM | POA: Diagnosis present

## 2017-03-13 DIAGNOSIS — F332 Major depressive disorder, recurrent severe without psychotic features: Secondary | ICD-10-CM | POA: Diagnosis present

## 2017-03-13 DIAGNOSIS — O99342 Other mental disorders complicating pregnancy, second trimester: Secondary | ICD-10-CM | POA: Diagnosis present

## 2017-03-13 DIAGNOSIS — Z3A21 21 weeks gestation of pregnancy: Secondary | ICD-10-CM

## 2017-03-13 DIAGNOSIS — E559 Vitamin D deficiency, unspecified: Secondary | ICD-10-CM | POA: Diagnosis present

## 2017-03-13 DIAGNOSIS — O26892 Other specified pregnancy related conditions, second trimester: Secondary | ICD-10-CM | POA: Diagnosis not present

## 2017-03-13 DIAGNOSIS — Z3490 Encounter for supervision of normal pregnancy, unspecified, unspecified trimester: Secondary | ICD-10-CM

## 2017-03-13 LAB — GLUCOSE, CAPILLARY: GLUCOSE-CAPILLARY: 135 mg/dL — AB (ref 65–99)

## 2017-03-13 MED ORDER — BACITRACIN ZINC 500 UNIT/GM EX OINT: TOPICAL_OINTMENT | CUTANEOUS | Status: DC | PRN

## 2017-03-13 MED ORDER — HYDROXYZINE HCL 25 MG PO TABS
25.0000 mg | ORAL_TABLET | Freq: Four times a day (QID) | ORAL | Status: DC | PRN
  Administered 2017-03-13 – 2017-03-16 (×5): 25 mg via ORAL
  Filled 2017-03-13 (×5): qty 1

## 2017-03-13 MED ORDER — HYDROXYZINE HCL 50 MG PO TABS
50.0000 mg | ORAL_TABLET | Freq: Every evening | ORAL | Status: DC | PRN
  Administered 2017-03-13 – 2017-03-15 (×3): 50 mg via ORAL
  Filled 2017-03-13 (×3): qty 1

## 2017-03-13 MED ORDER — HYDROXYZINE HCL 25 MG PO TABS
25.0000 mg | ORAL_TABLET | Freq: Four times a day (QID) | ORAL | Status: DC | PRN
  Administered 2017-03-13: 25 mg via ORAL
  Filled 2017-03-13: qty 1

## 2017-03-13 MED ORDER — DIPHENHYDRAMINE HCL 25 MG PO CAPS: 25.0000 mg | ORAL_CAPSULE | Freq: Three times a day (TID) | ORAL | Status: DC | PRN

## 2017-03-13 NOTE — ED Notes (Signed)
Pt requested tylenol.  Pt provided Sprite & graham crackers prior to offering tylenol.  Pt now refusing tylenol.  Pt is tearful stating "I don't want to eat right now."

## 2017-03-13 NOTE — ED Notes (Signed)
Spoke with patient on policy and procedure of TCU.  Patient states that she was not informed on visitor and phone usage.  Patient was informed of why we maintain control of patients activities and she was not happy and doesn't understand why she can't do what she wants.  Patient states that her anxiety is starting to increase and that this situation is not helping her.

## 2017-03-13 NOTE — Progress Notes (Signed)
Patient ID: Anna DanielsCharisse R Braxton, female   DOB: 10/23/1985, 31 y.o.   MRN: [id number]  Pt currently presents with a blunted affect and guarded behavior. Pt attends group but then remains in bed for the rest of the night. Pt reports good sleep with current medication regimen.   Pt provided with medications per providers orders. Pt's labs and vitals were monitored throughout the night. Pt given a 1:1 about emotional and mental status. Pt supported and encouraged to express concerns and questions. Pt educated on medications. Dressings assessed, dry clean and intact.   Pt's safety ensured with 15 minute and environmental checks. Pt currently denies SI/HI and A/V hallucinations. Pt verbally agrees to seek staff if SI/HI or A/VH occurs and to consult with staff before acting on any harmful thoughts. Will continue POC.

## 2017-03-13 NOTE — Progress Notes (Signed)
Adult Psychoeducational Group Note  Date:  03/13/2017 Time:  9:08 PM  Group Topic/Focus:  Wrap-Up Group:   The focus of this group is to help patients review their daily goal of treatment and discuss progress on daily workbooks.  Participation Level:  Active  Participation Quality:  Appropriate  Affect:  Appropriate  Cognitive:  Alert  Insight: Appropriate  Engagement in Group:  Engaged  Modes of Intervention:  Discussion  Additional Comments:  Patient's goal for today was to get through the day.   Anna Nguyen 03/13/2017, 9:08 PM

## 2017-03-13 NOTE — BH Assessment (Signed)
[facility] Assessment Progress Note  Per Thedore MinsMojeed Akintayo, MD, this pt requires psychiatric hospitalization at this time.  Malva LimesLinsey Strader, RN, [id number]; they will be ready to receive pt at 12:30.  Pt has signed Voluntary Admission and Consent for Treatment, as well as Consent to Release Information to her husband, and signed [facility].  Pt's nurse, [place], has been notified, and agrees to send original paperwork along with pt via [place], and to call report to [phone].  Doylene Canninghomas Mischa Pollard, MA Triage Specialist [phone]

## 2017-03-13 NOTE — ED Notes (Signed)
Mother arrived to ED and was allowed to see patient to help deescalate patient anxiety.  Director of ED aware of situation.

## 2017-03-13 NOTE — ED Notes (Signed)
Patient is alert and oriented x3.  [facility]  Patient gave verbal understanding. She was DC ambulatory under her own power to home.  V/S stable.  He was not showing any signs of distress on DC

## 2017-03-13 NOTE — Consult Note (Signed)
[facility] Face-to-Face Psychiatry Consult   Reason for Consult:  Suicide attempt Referring Physician:  EDP Patient Identification: Anna Nguyen MRN:  [id number] Principal Diagnosis: Major depressive disorder, recurrent severe without psychotic features [facility]) Diagnosis:   Patient Active Problem List   Diagnosis Date Noted  . Major depressive disorder, recurrent severe without psychotic features ([place]) [F33.2] 03/13/2017    Priority: High  . Pregnancy [Z34.90] 06/10/2016  . Severe hyperemesis gravidarum [O21.1] 03/25/2016  . Severe nausea and vomiting [R11.2] 01/16/2016  . Anemia affecting pregnancy in second trimester [O99.012] 01/16/2016  . 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 Hyperglycemia [R73.9] 08/22/2013  . Vitamin D deficiency [E55.9] 07/15/2013  . PCOS (polycystic ovarian syndrome) [E28.2] 02/28/2013  . B12 deficiency anemia [D51.9] 07/14/2012  . Folate deficiency anemia [D52.9] 07/14/2012  . Contraception management [Z30.9] 05/06/2011  . Gastric bypass status for obesity [Z98.84] 05/06/2011  . Iron deficiency anemia [D50.9] 10/02/2009    Total Time spent with patient: 45 minutes  Subjective:   Anna Nguyen is a 31 y.o. female patient admitted with suicide attempt.  HPI:  31 yo female who presented to the ED with suicide attempt by cutting her wrists, stitches on right wrist.  Depressed and depressed affect, continues to have stresses and relationship issues.  Feelings of hopelessness, helplessness, and worthlessness.  No homicidal ideations, hallucinations, or alcohol issues.  Positive for cannabis.  Past Psychiatric History: depression  Risk to Self: Suicidal Ideation: No Suicidal Intent: No Is patient at risk for suicide?: Yes Suicidal Plan?: Yes-Currently Present Specify Current Suicidal Plan: cutting wrists Access to Means: Yes Specify Access to Suicidal Means: [place] What has been your use of drugs/alcohol within the last 12 months?: N/A How many  times?: 0 Other Self Harm Risks: None Triggers for Past Attempts: None known Intentional Self Injurious Behavior: None Risk to Others: Homicidal Ideation: No Thoughts of Harm to Others: No Current Homicidal Intent: No Current Homicidal Plan: No Access to Homicidal Means: No Identified Victim: No one History of harm to others?: Yes Assessment of Violence: In distant past Violent Behavior Description: Teenage years Does patient have access to weapons?: Yes (Comment) (Guns are secured.) Criminal Charges Pending?: No Does patient have a court date: No Prior Inpatient Therapy: Prior Inpatient Therapy: No Prior Therapy Dates: None Prior Therapy Facilty/Provider(s): None Reason for Treatment: None Prior Outpatient Therapy: Prior Outpatient Therapy: No Prior Therapy Dates: None Prior Therapy Facilty/Provider(s): None Reason for Treatment: None Does patient have an ACCT team?: No Does patient have Intensive In-House Services?  : No Does patient have [facility] services? : No Does patient have P4CC services?: No  Past Medical History:  Past Medical History:  Diagnosis Date  . Anemia, iron deficiency   . HTN (hypertension)    with last pregnancy  . PCOS (polycystic ovarian syndrome)   . Type II or unspecified type diabetes mellitus without mention of complication, not stated as uncontrolled    not on insulin    Past Surgical History:  Procedure Laterality Date  . LAPAROSCOPIC GASTRIC BANDING    . TONSILLECTOMY     Family History:  Family History  Problem Relation Age of Onset  . Arthritis Other   . Diabetes Other   . Hypertension Other   . Hyperlipidemia Other   . Prostate cancer Other   . Cancer Maternal Aunt        breast   Family Psychiatric  History: unknown Social History:  History  Alcohol Use No  History  Drug Use No    Social History   Social History  . Marital status: Married    Spouse name: N/A  . Number of children: N/A  . Years of education: N/A    Occupational History  . 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 Smoking status: Never Smoker  . Smokeless tobacco: Never Used  . Alcohol use No  . Drug use: No  . Sexual activity: Yes    Partners: Male    Birth control/ protection: None   Other Topics Concern  . None   Social History Narrative   Regular Exercise-yes   Caffeine use: occasionaly               Additional Social History:    Allergies:   Allergies  Allergen Reactions  . Aspirin Other (See Comments)    Reaction:  Unknown   . Nsaids Other (See Comments)    Reaction:  Unknown   . Latex Rash    Labs:  Results for orders placed or performed during the hospital encounter of 03/12/17 (from the past 48 hour(s))  CBG monitoring, ED     Status: None   Collection Time: 03/12/17  3:42 PM  Result Value Ref Range   Glucose-Capillary 98 65 - 99 mg/dL  Rapid urine drug screen (hospital performed)     Status: Abnormal   Collection Time: 03/12/17  3:50 PM  Result Value Ref Range   Opiates NONE DETECTED NONE DETECTED   Cocaine NONE DETECTED NONE DETECTED   Benzodiazepines NONE DETECTED NONE DETECTED   Amphetamines NONE DETECTED NONE DETECTED   Tetrahydrocannabinol POSITIVE (A) NONE DETECTED   Barbiturates NONE DETECTED NONE DETECTED    Comment:        DRUG SCREEN FOR MEDICAL PURPOSES ONLY.  IF CONFIRMATION IS NEEDED FOR ANY PURPOSE, NOTIFY LAB WITHIN 5 DAYS.        LOWEST DETECTABLE LIMITS FOR URINE DRUG SCREEN Drug Class       Cutoff (ng/mL) Amphetamine      1000 Barbiturate      200 Benzodiazepine   650 Tricyclics       354 Opiates          300 Cocaine          300 THC              50   Urinalysis, Routine w reflex microscopic     Status: Abnormal   Collection Time: 03/12/17  3:51 PM  Result Value Ref Range   Color, Urine YELLOW YELLOW   APPearance HAZY (A) CLEAR   Specific Gravity, Urine 1.019 1.005 - 1.030   pH 5.0 5.0 - 8.0   Glucose, UA NEGATIVE NEGATIVE mg/dL    Hgb urine dipstick NEGATIVE NEGATIVE   Bilirubin Urine NEGATIVE NEGATIVE   Ketones, ur 20 (A) NEGATIVE mg/dL   Protein, ur NEGATIVE NEGATIVE mg/dL   Nitrite NEGATIVE NEGATIVE   Leukocytes, UA NEGATIVE NEGATIVE  Comprehensive metabolic panel     Status: Abnormal   Collection Time: 03/12/17  4:48 PM  Result Value Ref Range   Sodium 138 135 - 145 mmol/L   Potassium 3.2 (L) 3.5 - 5.1 mmol/L   Chloride 109 101 - 111 mmol/L   CO2 20 (L) 22 - 32 mmol/L   Glucose, Bld 97 65 - 99 mg/dL   BUN <5 (L) 6 - 20 mg/dL   Creatinine, Ser 0.46 0.44 - 1.00 mg/dL  Calcium 8.8 (L) 8.9 - 10.3 mg/dL   Total Protein 7.1 6.5 - 8.1 g/dL   Albumin 3.4 (L) 3.5 - 5.0 g/dL   AST 14 (L) 15 - 41 U/L   ALT 9 (L) 14 - 54 U/L   Alkaline Phosphatase 68 38 - 126 U/L   Total Bilirubin 0.3 0.3 - 1.2 mg/dL   GFR calc non Af Amer >60 >60 mL/min   GFR calc Af Amer >60 >60 mL/min    Comment: (NOTE) The eGFR has been calculated using the CKD EPI equation. This calculation has not been validated in all clinical situations. eGFR's persistently <60 mL/min signify possible Chronic Kidney Disease.    Anion gap 9 5 - 15  Ethanol     Status: None   Collection Time: 03/12/17  4:48 PM  Result Value Ref Range   Alcohol, Ethyl (B) <5 <5 mg/dL    Comment:        LOWEST DETECTABLE LIMIT FOR SERUM ALCOHOL IS 5 mg/dL FOR MEDICAL PURPOSES ONLY   Salicylate level     Status: None   Collection Time: 03/12/17  4:48 PM  Result Value Ref Range   Salicylate Lvl <8.6 2.8 - 30.0 mg/dL  Acetaminophen level     Status: Abnormal   Collection Time: 03/12/17  4:48 PM  Result Value Ref Range   Acetaminophen (Tylenol), Serum <10 (L) 10 - 30 ug/mL    Comment:        THERAPEUTIC CONCENTRATIONS VARY SIGNIFICANTLY. A RANGE OF 10-30 ug/mL MAY BE AN EFFECTIVE CONCENTRATION FOR MANY PATIENTS. HOWEVER, SOME ARE BEST TREATED AT CONCENTRATIONS OUTSIDE THIS RANGE. ACETAMINOPHEN CONCENTRATIONS >150 ug/mL AT 4 HOURS AFTER INGESTION AND >50  ug/mL AT 12 HOURS AFTER INGESTION ARE OFTEN ASSOCIATED WITH TOXIC REACTIONS.   cbc     Status: Abnormal   Collection Time: 03/12/17  4:48 PM  Result Value Ref Range   WBC 9.1 4.0 - 10.5 K/uL   RBC 3.51 (L) 3.87 - 5.11 MIL/uL   Hemoglobin 10.0 (L) 12.0 - 15.0 g/dL   HCT 30.4 (L) 36.0 - 46.0 %   MCV 86.6 78.0 - 100.0 fL   MCH 28.5 26.0 - 34.0 pg   MCHC 32.9 30.0 - 36.0 g/dL   RDW 13.6 11.5 - 15.5 %   Platelets 172 150 - 400 K/uL  hCG, quantitative, pregnancy     Status: Abnormal   Collection Time: 03/12/17  4:48 PM  Result Value Ref Range   hCG, Beta Chain, Quant, S 4,581 (H) <5 mIU/mL    Comment:          GEST. AGE      CONC.  (mIU/mL)   <=1 WEEK        5 - 50     2 WEEKS       50 - 500     3 WEEKS       100 - 10,000     4 WEEKS     1,000 - 30,000     5 WEEKS     3,500 - 115,000   6-8 WEEKS     12,000 - 270,000    12 WEEKS     15,000 - 220,000        FEMALE AND NON-PREGNANT FEMALE:     LESS THAN 5 mIU/mL   C difficile quick scan w PCR reflex     Status: None   Collection Time: 03/12/17  5:30 PM  Result Value Ref Range   C  Diff antigen NEGATIVE NEGATIVE   C Diff toxin NEGATIVE NEGATIVE   C Diff interpretation No C. difficile detected.     Current Facility-Administered Medications  Medication Dose Route Frequency Provider Last Rate Last Dose  . bacitracin ointment   Topical PRN Little, Wenda Overland, MD   1 application at 75/10/25 [phone]  . hydrOXYzine (ATARAX/VISTARIL) tablet 25 mg  25 mg Oral Q6H PRN Patrecia Pour, NP   25 mg at 03/13/17 7824   Current Outpatient Prescriptions  Medication Sig Dispense Refill  . promethazine (PHENERGAN) 25 MG tablet Take 1 tablet (25 mg total) by mouth every 6 (six) hours as needed for nausea or vomiting. 30 tablet 0  . sertraline (ZOLOFT) 50 MG tablet TK 1 T PO QD  3  . ondansetron (ZOFRAN ODT) 4 MG disintegrating tablet Take 1 tablet (4 mg total) by mouth every 8 (eight) hours as needed for nausea or vomiting. (Patient not taking:  Reported on 03/12/2017) 20 tablet 0  . ranitidine (ZANTAC) 150 MG capsule Take 1 capsule (150 mg total) by mouth 2 (two) times daily. (Patient not taking: Reported on 03/12/2017) 60 capsule 1    Musculoskeletal: Strength & Muscle Tone: within normal limits Gait & Station: normal Patient leans: N/A  Psychiatric Specialty Exam: Physical Exam  Constitutional: She is oriented to person, place, and time. She appears well-developed and well-nourished.  HENT:  Head: Normocephalic.  Respiratory: Effort normal.  Musculoskeletal: Normal range of motion.  Neurological: She is alert and oriented to person, place, and time.  Psychiatric: Her speech is normal and behavior is normal. Cognition and memory are normal. She expresses impulsivity. She exhibits a depressed mood. She expresses suicidal ideation. She expresses suicidal plans.    Review of Systems  Psychiatric/Behavioral: Positive for depression and suicidal ideas.  All other systems reviewed and are negative.   Blood pressure 117/74, pulse 83, temperature 98.4 F (36.9 C), temperature source Oral, resp. rate 17, height '5\' 9"'$  (1.753 m), weight 104.3 kg (230 lb), last menstrual period 10/16/2016, SpO2 100 %, unknown if currently breastfeeding.Body mass index is 33.97 kg/m.  General Appearance: Casual  Eye Contact:  Fair  Speech:  Normal Rate  Volume:  Decreased  Mood:  Depressed  Affect:  Congruent  Thought Process:  Coherent and Descriptions of Associations: Intact  Orientation:  Full (Time, Place, and Person)  Thought Content:  Rumination  Suicidal Thoughts:  Yes.  with intent/plan  Homicidal Thoughts:  No  Memory:  Immediate;   Fair Recent;   Fair Remote;   Fair  Judgement:  Poor  Insight:  Fair  Psychomotor Activity:  Decreased  Concentration:  Concentration: Fair and Attention Span: Fair  Recall:  [employer] of Knowledge:  Good  Language:  Good  Akathisia:  No  Handed:  Right  AIMS (if indicated):     Assets:   Housing Leisure Time Physical Health Resilience Social Support  ADL's:  Intact  Cognition:  WNL  Sleep:        Treatment Plan Summary: Daily contact with patient to assess and evaluate symptoms and progress in treatment, Medication management and Plan major depressive disorder, recurrent, severe withouth psychosis:  -Crisis stabilization -Medication management:  Started vistaril 25 mg every six hours PRN anxiety -Transferred to [facility] [address] hall -Individual counseling  Disposition: Recommend psychiatric Inpatient admission when medically cleared.  Waylan Boga, NP 03/13/2017 10:08 AM  Patient seen face-to-face for psychiatric evaluation, chart reviewed and case discussed with the physician extender and developed  treatment plan. Reviewed the information documented and agree with the treatment plan. Corena Pilgrim, MD

## 2017-03-13 NOTE — Progress Notes (Signed)
31 year old female pt admitted on voluntary basis. Anna Nguyen reports some on-going anxiety and depression and spoke about how she gets upset that she has a hard time controlling herself and being able to calm down. She is currently pregnant with a small child in the home and also currently married. She reports that she has been compliant with medications, denies any alcohol or substance abuse issues and reports that she needs help with dealing with her anxiety while she is here. Anna Nguyen did sign 72 hour request for discharge on admission, was oriented to the unit and safety maintained.

## 2017-03-13 NOTE — Progress Notes (Signed)
Report received from admitting nurse. Patient denies SI/HI/AVH and pain at this time. Orders received and acknowledged.  Medications administered as per order. Patient verbally contracts for safety on the unit and agrees to come to staff before acting on any self harm thoughts/feelings and other needs or concerns. 15 min checks initiated for safety and patient oriented to the unit and the unit schedule. 

## 2017-03-13 NOTE — Tx Team (Signed)
Initial Treatment Plan 03/13/2017 2:18 PM Anna DanielsCharisse R Nguyen EAV:[id number]    PATIENT STRESSORS: Marital or family conflict   PATIENT STRENGTHS: Ability for insight Average or above average intelligence Capable of independent living General fund of knowledge Motivation for treatment/growth   PATIENT IDENTIFIED PROBLEMS: Depression Anxiety Suicidal thoughts "My anxiety, I have a hard time calming down when I get upset"                     DISCHARGE CRITERIA:  Ability to meet basic life and health needs Improved stabilization in mood, thinking, and/or behavior Verbal commitment to aftercare and medication compliance  PRELIMINARY DISCHARGE PLAN: Attend aftercare/continuing care group Return to previous living arrangement  PATIENT/FAMILY INVOLVEMENT: This treatment plan has been presented to and reviewed with the patient, Anna DanielsCharisse R Nguyen, and/or family member, .  The patient and family have been given the opportunity to ask questions and make suggestions.  Anna Nguyen, Anna Nguyen, [place]RN 03/13/2017, 2:18 PM

## 2017-03-14 DIAGNOSIS — Z818 Family history of other mental and behavioral disorders: Secondary | ICD-10-CM

## 2017-03-14 DIAGNOSIS — F332 Major depressive disorder, recurrent severe without psychotic features: Secondary | ICD-10-CM

## 2017-03-14 LAB — GLUCOSE, CAPILLARY
Glucose-Capillary: 83 mg/dL (ref 65–99)
Glucose-Capillary: 73 mg/dL (ref 65–99)

## 2017-03-14 MED ORDER — SERTRALINE HCL 25 MG PO TABS
25.0000 mg | ORAL_TABLET | Freq: Every day | ORAL | Status: DC
  Administered 2017-03-14 – 2017-03-15 (×2): 25 mg via ORAL
  Filled 2017-03-14 (×4): qty 1

## 2017-03-14 MED ORDER — PROMETHAZINE HCL 25 MG PO TABS
12.5000 mg | ORAL_TABLET | Freq: Three times a day (TID) | ORAL | Status: DC | PRN
  Administered 2017-03-14 – 2017-03-16 (×3): 12.5 mg via ORAL
  Filled 2017-03-14 (×3): qty 1
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
## 2017-03-14 NOTE — BHH Group Notes (Signed)
Adult Therapy Group Note (Clinical Social Work)  Date:  03/14/2017  Time:  10:00-11:00AM  Group Topic/Focus:  HEALTHY COPING SKILLS  Today's group focused on identifying healthy coping skills already in use by each patient, as well as healthy coping skills they would like to learn.  There was much sharing, support, psychoeducation, and encouragement provided.  Participation Level:  Active  Participation Quality:  Attentive  Affect:  Depressed and Flat  Cognitive:  Appropriate  Insight: Improving  Engagement in Group:  Developing/Improving  Modes of Intervention:  Discussion, Support and Processing  Additional Comments:  The patient shared that healthy coping already used is "a little of everything everybody else has already talked about" and her unhealthy coping includes severe isolation.  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  She stated she wants to learn better communication, particularly with her husband, but when people made suggestions she stated she already knows how to communicate because of her former employment.  She did think she might try writing out what she wants to say first, and practicing it.  Carloyn JaegerMareida J Grossman-Orr 02/07/2017, 1:28 PM
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
## 2017-03-14 NOTE — [facility] Suicide Risk Assessment (Signed)
[facility]   Nursing information obtained from:    Demographic factors:    Current Mental Status:    Loss Factors:    Historical Factors:    Risk Reduction Factors:     Total Time spent with patient: 45 minutes Principal Problem: Major depressive disorder, recurrent severe without psychotic features (HCC) Diagnosis:   Patient Active Problem List   Diagnosis Date Noted  . Major depressive disorder, recurrent severe without psychotic features (HCC) [F33.2] 03/13/2017  . Pregnancy [Z34.90] 06/10/2016  . Severe hyperemesis gravidarum [O21.1] 03/25/2016  . Severe nausea and vomiting [R11.2] 01/16/2016  . Anemia affecting pregnancy in second trimester [O99.012] 01/16/2016  . 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 Hyperglycemia [R73.9] 08/22/2013  . Vitamin D deficiency [E55.9] 07/15/2013  . PCOS (polycystic ovarian syndrome) [E28.2] 02/28/2013  . B12 deficiency anemia [D51.9] 07/14/2012  . Folate deficiency anemia [D52.9] 07/14/2012  . Contraception management [Z30.9] 05/06/2011  . Gastric bypass status for obesity [Z98.84] 05/06/2011  . Iron deficiency anemia [D50.9] 10/02/2009   Subjective Data:  31 yo AAF, married, lives with her family. [redacted] weeks pregnant. Background history of MDD recurrent. Presented to the ER after a suicide attempt. Slit her wrist with a knife. Strained relationship with her husband. They had an argument the night before. Patient was so upset and slept in a motel. Returned the following day and the argument escalated. Cut self while her husband was at home. Patient recently found out her husband was having an affair with someone she knows. Says when she came back from the hotel, she realized he had called her while she was gone. Patient says she acted impulsively. Never planned to harm herself. Says at that time she had homicidal thoughts towards her husband and the other lady. Her husband has secured the weapons in the house.  Patient says she has had time to think things through. She had used the opportunity to share how own infidelity with her husband. Says they have talked things over and have forgiven each other. Patient is no longer having suicidal or homicidal thoughts. She is no longer having any rage. She has an eight month daughter and a baby on the way. Says she wants both parents to be there for them. No family history of suicide.  Patient is already on Sertraline. She would continue at current dose.   Continued Clinical Symptoms:  Alcohol Use Disorder Identification Test Final Score (AUDIT): 0 The "Alcohol Use Disorders Identification Test", Guidelines for Use in Primary Care, Second Edition.  World Science writerHealth Organization [facility](WHO). Score between 0-7:  no or low risk or alcohol related problems. Score between 8-15:  moderate risk of alcohol related problems. Score between 16-19:  high risk of alcohol related problems. Score 20 or above:  warrants further diagnostic evaluation for alcohol dependence and treatment.   CLINICAL FACTORS:  Adjust issues MDD   Musculoskeletal: Strength & Muscle Tone: within normal limits Gait & Station: normal Patient leans: N/A  Psychiatric Specialty Exam: Physical Exam  Constitutional: She is oriented to person, place, and time. She appears well-developed and well-nourished.  HENT:  Head: Normocephalic and atraumatic.  Eyes: Conjunctivae are normal. Pupils are equal, round, and reactive to light.  Neck: Normal range of motion. Neck supple.  Neurological: She is alert and oriented to person, place, and time.  Skin: Skin is warm and dry.  Psychiatric:  As above    ROS  Blood pressure 102/65, pulse 86, temperature 98.2 F (36.8 C),  temperature source Oral, resp. rate 18, height 5\' 9"  (1.753 m), weight 103.9 kg (229 lb), last menstrual period 10/16/2016, unknown if currently breastfeeding.Body mass index is 33.82 kg/m.  General Appearance: Well Groomed, calm and  cooperative.   Eye Contact:  Good  Speech:  Clear and Coherent and Normal Rate  Volume:  Normal  Mood:  Euthymic  Affect:  Appropriate and Full Range  Thought Process:  Linear  Orientation:  Full (Time, Place, and Person)  Thought Content:  Future oriented.  Suicidal Thoughts:  No  Homicidal Thoughts:  No  Memory:  Immediate;   Good Recent;   Good Remote;   Good  Judgement:  Fair  Insight:  Good  Psychomotor Activity:  Normal  Concentration:  Concentration: Good and Attention Span: Good  Recall:  Good  Fund of Knowledge:  Good  Language:  Good  Akathisia:  Negative  Handed:    AIMS (if indicated):     Assets:  Communication Skills Desire for Improvement Financial Resources/Insurance Housing Intimacy Physical Health Resilience Social Support Talents/Skills Transportation Vocational/Educational  ADL's:  Intact  Cognition:  WNL  Sleep:  Number of Hours: 6.5      COGNITIVE FEATURES THAT CONTRIBUTE TO RISK:  None    SUICIDE RISK:   Minimal: No identifiable suicidal ideation.  Patients presenting with no risk factors but with morbid ruminations; may be classified as minimal risk based on the severity of the depressive symptoms  PLAN OF CARE:  1. Continue Sertraline 2. Collateral from her family 3. Suicide precautions.  I certify that inpatient services furnished can reasonably be expected to improve the patient's condition.   Georgiann Cocker, MD 03/14/2017, 7:21 PM

## 2017-03-14 NOTE — Progress Notes (Signed)
Writer spoke with patient and she reports having had a better day today. She reports being out of her room more today and not isolating in her room. She reports not crying today. She reports that she at first she felt that she did not need to be here but realizes that she was experiencing a crisis and did not handle it well at all. She reports that she and her husband both are going to work on their marriage and they will have their first date since the incident after her discharge. She reports that her episode of feeling nauseous was resolved with medication given earlier. Support given and safety maintained on unit with 15 min checks.

## 2017-03-14 NOTE — Progress Notes (Signed)
D: Pt A & O X4. Denies SI, HI, AVH and pain when assessed. Presents with flat affect and depressed mood. Pt is guarded, however, forwards on conversations. Discussed events leading to admission with writer "my husband cheated on me with our child's teacher, I saw every thing out on his phone, I was just very hurt, I left and went to sleep at a hotel an hour away, came back home, got into an argument with him, I blacked out and cut myself".  Rates her depression 2/10, hopelessness 2/10 and anxiety 10/10.  Reports good sleep with poor appetite, low energy and poor concentration level.  A: Scheduled and PRN (Phenergan 12.5) medications administered as per MD's orders and effects monitored. Emotional support and availability provided to pt. Encouraged pt to voice concerns, comply with treatment plan including groups and to increase PO intake. Dressing change done to bilateral wrist, site clean, dry and intact without s/s of infection to note at this time. Routine safety checks continues without self harm harm gestures to note thus far.  R: Pt compliant with medications. Denies adverse drug reactions at this time. Reports relief from nausea. Visible in milieu at brief intervals for snacks, lunch and medications. Pt did not attend groups.. Safety maintained on and off unit.

## 2017-03-14 NOTE — Plan of Care (Signed)
Problem: Safety: Goal: Periods of time without injury will increase Outcome: Progressing Q 15 safety checks maintained without self harm gestures thus far this shift.  Problem: Medication: Goal: Compliance with prescribed medication regimen will improve Outcome: Progressing Pt compliant with medications as ordered. Denies adverse drug reactions when assessed.

## 2017-03-15 LAB — GLUCOSE, CAPILLARY
GLUCOSE-CAPILLARY: 46 mg/dL — AB (ref 65–99)
GLUCOSE-CAPILLARY: 152 mg/dL — AB (ref 65–99)
Glucose-Capillary: 87 mg/dL (ref 65–99)
Glucose-Capillary: 81 mg/dL (ref 65–99)
Glucose-Capillary: 87 mg/dL (ref 65–99)
Glucose-Capillary: 66 mg/dL (ref 65–99)

## 2017-03-15 MED ORDER — ESCITALOPRAM OXALATE 5 MG PO TABS
5.0000 mg | ORAL_TABLET | Freq: Every day | ORAL | Status: DC
  Administered 2017-03-15: 5 mg via ORAL
  Filled 2017-03-15 (×3): qty 1

## 2017-03-15 NOTE — Progress Notes (Signed)
Hypoglycemic Event  CBG: 46  Treatment: 1 tube instant glucose  Symptoms: Pale, Sweaty and Nervous/irritable  Follow-up CBG: Time:1230 CBG Result  81  Possible Reasons for Event:  Comments/MD notified:not enough caloric intake and pt currently 21 wk with IUP.     Rich Braveuke, Maurico Perrell Lynn

## 2017-03-15 NOTE — Progress Notes (Signed)
Patient requests that her blood sugar be checked, due to her dropping at night previously. CBG 66 at this time. Patient given a sandwich, snacks and juice.

## 2017-03-15 NOTE — [facility] Suicide Risk Assessment (Signed)
[facility] INPATIENT:  Family/Significant Other Suicide Prevention Education  Suicide Prevention Education:  Education Completed; Regions Financial CorporationBrencent Nguyen [phone] has been identified by the patient as the family member/significant other with whom the patient will be residing, and identified as the person(s) who will aid the patient in the event of a mental health crisis (suicidal ideations/suicide attempt).  With written consent from the patient, the family member/significant other has been provided the following suicide prevention education, prior to the and/or following the discharge of the patient.  HUSBAND REPORTED THAT ALL GUNS IN THE HOME HAVE BEEN SECURED.  The suicide prevention education provided includes the following:  Suicide risk factors  Suicide prevention and interventions  National Suicide Hotline telephone number  [facility] assessment telephone number  [facility] Assistance 911  [facility] and/or [facility]idential Mobile Crisis Unit telephone number  Request made of family/significant other to:  Remove weapons (e.g., guns, rifles, knives), all items previously/currently identified as safety concern.    Remove drugs/medications (over-the-counter, prescriptions, illicit drugs), all items previously/currently identified as a safety concern.  The family member/significant other verbalizes understanding of the suicide prevention education information provided.  The family member/significant other agrees to remove the items of safety concern listed above.  Anna JaegerMareida J Nguyen 03/15/2017, 4:02 PM

## 2017-03-15 NOTE — Progress Notes (Signed)
[facility] MD Progress Note  03/15/2017 9:40 AM [facility] IMES  MRN:  [id number] Subjective:  I was waking up in the morning with racing thoughts. I was previously on the Zoloft at 100mg  and the doctor stopped it cold [place]. I didn't feel like it was working for me. I really think Im missing my baby. I am mom, and I can do this at home taking care of my mom.   Per nursing: Writer spoke with patient and she reports having had a better day today. She reports being out of her room more today and not isolating in her room. She reports not crying today. She reports that she at first she felt that she did not need to be here but realizes that she was experiencing a crisis and did not handle it well at all. She reports that she and her husband both are going to work on their marriage and they will have their first date since the incident after her discharge. She reports that her episode of feeling nauseous was resolved with medication given earlier. Support given and safety maintained on unit with 15 min checks.  Objective:Case discussed during treatment team  and chart reviewed. During this evaluation patient remains alert and oriented x3, calm, and cooperative. Marine continues to show improvement in treatment as good response to current therapeutic skills and social support from her husband. She is on ZOloft 25mg  po daily for depression management however she feels as though this medication has never helped with depression. Writer discussed concerns with pregnancy and medications, we have agreed to switch to Lexapro. She is a successful peer support counselor and has been able to identify what areas of improvement she needs to work on. She is showing lots of insight and improved judgement aeb improvement and willingness to go to therapy and self analyzing herself.  She reports this medication is well tolerated with adverse effects. Jalisa continues to exhibit symptoms of depression although she notes overall  improvement since her admission. Sleep and eating patterns remains unchanged without difficulty. No irritability noted or reported and patient continues to engage well with both peers and staff. She continues to refute any active or passive suicidal thoughts. At current, she is able to contract for safety on the unit.        Principal Problem: Major depressive disorder, recurrent severe without psychotic features (HCC) Diagnosis:   Patient Active Problem List   Diagnosis Date Noted  . Major depressive disorder, recurrent severe without psychotic features (HCC) [F33.2] 03/13/2017  . Pregnancy [Z34.90] 06/10/2016  . Severe hyperemesis gravidarum [O21.1] 03/25/2016  . Severe nausea and vomiting [R11.2] 01/16/2016  . Anemia affecting pregnancy in second trimester [O99.012] 01/16/2016  . 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 Hyperglycemia [R73.9] 08/22/2013  . Vitamin D deficiency [E55.9] 07/15/2013  . PCOS (polycystic ovarian syndrome) [E28.2] 02/28/2013  . B12 deficiency anemia [D51.9] 07/14/2012  . Folate deficiency anemia [D52.9] 07/14/2012  . Contraception management [Z30.9] 05/06/2011  . Gastric bypass status for obesity [Z98.84] 05/06/2011  . Iron deficiency anemia [D50.9] 10/02/2009   Total Time spent with patient: 30 minutes  Past Psychiatric History: Peri-partum depression and post partum depression  Past Medical History:  Past Medical History:  Diagnosis Date  . Anemia, iron deficiency   . HTN (hypertension)    with last pregnancy  . PCOS (polycystic ovarian syndrome)   . Type II or unspecified type diabetes mellitus without mention of complication, not stated as uncontrolled    not  on insulin    Past Surgical History:  Procedure Laterality Date  . LAPAROSCOPIC GASTRIC BANDING    . TONSILLECTOMY     Family History:  Family History  Problem Relation Age of Onset  . Arthritis Other   . Diabetes Other   . Hypertension Other   . Hyperlipidemia Other   .  Prostate cancer Other   . Cancer Maternal Aunt        breast   Family Psychiatric  History: Father-schizophrenia, Maternal Grandfather- mental illness per patient not sure. Maternal Grandmother-Bipolar Social History:  History  Alcohol Use No     History  Drug Use No    Social History   Social History  . Marital status: Married    Spouse name: N/A  . Number of children: N/A  . Years of education: N/A   Occupational History  . 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 Smoking status: Never Smoker  . Smokeless tobacco: Never Used  . Alcohol use No  . Drug use: No  . Sexual activity: Yes    Partners: Male    Birth control/ protection: None   Other Topics Concern  . None   Social History Narrative   Regular Exercise-yes   Caffeine use: occasionaly               Additional Social History:                         Sleep: Fair  Appetite:  Fair  Current Medications: Current Facility-Administered Medications  Medication Dose Route Frequency Provider Last Rate Last Dose  . bacitracin ointment   Topical PRN Charm Rings, NP      . hydrOXYzine (ATARAX/VISTARIL) tablet 25 mg  25 mg Oral Q6H PRN Charm Rings, NP   25 mg at 03/15/17 0810  . hydrOXYzine (ATARAX/VISTARIL) tablet 50 mg  50 mg Oral QHS PRN Armandina Stammer I, NP   50 mg at 03/14/17 2129  . promethazine (PHENERGAN) tablet 12.5 mg  12.5 mg Oral Q8H PRN Truman Hayward, FNP   12.5 mg at 03/14/17 1441  . sertraline (ZOLOFT) tablet 25 mg  25 mg Oral Daily Truman Hayward, FNP   25 mg at 03/15/17 1610    Lab Results:  Results for orders placed or performed during the hospital encounter of 03/13/17 (from the past 48 hour(s))  Glucose, capillary     Status: Abnormal   Collection Time: 03/13/17  4:49 PM  Result Value Ref Range   Glucose-Capillary 135 (H) 65 - 99 mg/dL  Glucose, capillary     Status: None   Collection Time: 03/14/17  6:06 AM  Result Value Ref Range    Glucose-Capillary 83 65 - 99 mg/dL  Glucose, capillary     Status: None   Collection Time: 03/14/17 12:05 PM  Result Value Ref Range   Glucose-Capillary 73 65 - 99 mg/dL  Glucose, capillary     Status: None   Collection Time: 03/15/17  5:57 AM  Result Value Ref Range   Glucose-Capillary 87 65 - 99 mg/dL    Blood Alcohol level:  Lab Results  Component Value Date   ETH <5 03/12/2017    Metabolic Disorder Labs: Lab Results  Component Value Date   HGBA1C 5.0 03/25/2016   MPG 97 03/25/2016   MPG 349 (H) 04/13/2010   Lab Results  Component Value Date   PROLACTIN 2.7  11/07/2014   Lab Results  Component Value Date   CHOL 149 07/13/2012   TRIG 74.0 07/13/2012   HDL 75.90 07/13/2012   CHOLHDL 2 07/13/2012   VLDL 14.8 07/13/2012   LDLCALC 58 07/13/2012   LDLCALC 69 12/18/2010    Physical Findings: AIMS: Facial and Oral Movements Muscles of Facial Expression: None, normal Lips and Perioral Area: None, normal Jaw: None, normal Tongue: None, normal,Extremity Movements Upper (arms, wrists, hands, fingers): None, normal Lower (legs, knees, ankles, toes): None, normal, Trunk Movements Neck, shoulders, hips: None, normal, Overall Severity Severity of abnormal movements (highest score from questions above): None, normal Incapacitation due to abnormal movements: None, normal Patient's awareness of abnormal movements (rate only patient's report): No Awareness, Dental Status Current problems with teeth and/or dentures?: No Does patient usually wear dentures?: No  CIWA:    COWS:     Musculoskeletal: Strength & Muscle Tone: within normal limits Gait & Station: normal Patient leans: N/A  Psychiatric Specialty Exam: Physical Exam  Nursing note and vitals reviewed. Skin: Bruising noted.     Moderate hyperpigmentation and bruising to lateral aspect of the hand.     Review of Systems  All other systems reviewed and are negative.   Blood pressure 106/64, pulse 82,  temperature 98.5 F (36.9 C), temperature source Oral, resp. rate 16, height 5\' 9"  (1.753 m), weight 103.9 kg (229 lb), last menstrual period 10/16/2016, unknown if currently breastfeeding.Body mass index is 33.82 kg/m.  General Appearance: Fairly Groomed  Eye Contact:  Fair  Speech:  Clear and Coherent and Normal Rate  Volume:  Normal  Mood:  Depressed  Affect:  Depressed and Flat  Thought Process:  Linear and Descriptions of Associations: Intact  Orientation:  Full (Time, Place, and Person)  Thought Content:  Logical  Suicidal Thoughts:  No  Homicidal Thoughts:  No  Memory:  Immediate;   Fair Recent;   Fair  Judgement:  Fair  Insight:  Fair and Present  Psychomotor Activity:  Normal  Concentration:  Concentration: Good and Attention Span: Good  Recall:  [employer]Fair  Fund of Knowledge:  Fair  Language:  Good  Akathisia:  No  Handed:  Right  AIMS (if indicated):     Assets:  Communication Skills Desire for Improvement Financial Resources/Insurance Housing Intimacy Leisure Time Physical Health Social Support Talents/Skills Transportation Vocational/Educational  ADL's:  Intact  Cognition:  WNL  Sleep:  Number of Hours: 6.5     Treatment Plan Summary: Daily contact with patient to assess and evaluate symptoms and progress in treatment and Medication management  1. Will maintain Q 15 minutes observation for safety. Estimated LOS: 5-7 days 2. Patient will participate in group, milieu, and family therapy. Psychotherapy: Social and Doctor, hospitalcommunication skill training, anti-bullying, learning based strategies, cognitive behavioral, and family object relations individuation separation intervention psychotherapies can be considered.  3. Depression, not improving with Zoloft 25 mg daily for depression. She was previously on Zoloft 100mg  prior to coming into the hospital. WIll switch to lexapro she is in her 2nd trimester at this time.  4. Will continue to monitor patient's mood and  behavior. 5. Social Work will schedule a Family meeting to obtain collateral information and discuss discharge and follow up plan. Discharge concerns will also be addressed: Safety, stabilization, and access to medication Truman Haywardakia S Starkes, FNP 03/15/2017, 9:40 AM

## 2017-03-15 NOTE — Progress Notes (Signed)
D Pt is seen OOB UAL on the 400 hall today. She tolerates this fairly well.  AShe completed her daily assessment and on it she wrote  She denied SI today and she rated her depression, hopelessness and anxeity " 1/0/4", respectively. She requested extra dosages of carbohydrates ( to keep her blood sugars from dropping) and this request is taken to dietary by this Clinical research associatewriter . R Safety in place.

## 2017-03-15 NOTE — Progress Notes (Signed)
Patient attended group and said that her day was a 10. Something positive was, she enjoyed hanging out with everyone.

## 2017-03-15 NOTE — Progress Notes (Signed)
Patient attended group, but did not participate.

## 2017-03-15 NOTE — [facility] Group Notes (Signed)
[facility] Notes:  (Clinical Social Work)   02/15/2017    1:00-2:00PM  Summary of Progress/Problems:   The main focus of today's process group was to   1)  discuss importance of adding supports to stay well once out of the hospital  2)  generate ideas about what healthy supports can be added  3)  provide support regarding patient fears about discharge  4)  give examples of educational opportunities to learn about diagnoses  An emphasis was placed on using counselor, doctor, therapy groups, 12-step groups, and problem-specific support groups to expand supports.  We also discussed being a better supporter of oneself and making self-care a priority, talking at length about how if humans do not care first and foremost for themselves, they have nothing to give to or take care of others.  The patient stated little in group and said she was just listening, did not want to talk.  She did not appear to be very interested, was drowsy.  Type of Therapy:  Process Group with Motivational Interviewing  Participation Level:  Minimal  Participation Quality:  Drowsy  Affect:  Flat  Cognitive:  Appropriate and Oriented  Insight:  Limited  Engagement in Therapy:  Limited  Modes of Intervention:   Education, Support and Processing  Ambrose MantleMareida Grossman-Orr, LCSW 02/15/2017    2:17 PM

## 2017-03-15 NOTE — [facility] Suicide Risk Assessment (Signed)
[facility] INPATIENT:  Family/Significant Other Suicide Prevention Education  Suicide Prevention Education:  Contact Attempts: husband [employer] Financial CorporationBrencent Kren [phone] has been identified by the patient as the family member/significant other with whom the patient will be residing, and identified as the person(s) who will aid the patient in the event of a mental health crisis.  With written consent from the patient, two attempts were made to provide suicide prevention education, prior to and/or following the patient's discharge.  We were unsuccessful in providing suicide prevention education.  A suicide education pamphlet was given to the patient to share with family/significant other.  Date and time of first attempt:   03/15/17  /  3:53pm Date and time of second attempt:  To be done  Lynnell ChadMareida J Grossman-Orr 03/15/2017, 3:54 PM

## 2017-03-16 ENCOUNTER — Encounter (HOSPITAL_COMMUNITY): Payer: Self-pay | Admitting: Behavioral Health

## 2017-03-16 LAB — GLUCOSE, CAPILLARY
GLUCOSE-CAPILLARY: 78 mg/dL (ref 65–99)
Glucose-Capillary: 93 mg/dL (ref 65–99)

## 2017-03-16 MED ORDER — ESCITALOPRAM OXALATE 5 MG PO TABS: 5.0000 mg | ORAL_TABLET | Freq: Every day | ORAL | 0 refills | Status: DC

## 2017-03-16 MED ORDER — HYDROXYZINE HCL 25 MG PO TABS: 25.0000 mg | ORAL_TABLET | Freq: Four times a day (QID) | ORAL | 0 refills | Status: DC | PRN

## 2017-03-16 NOTE — Progress Notes (Signed)
Adult Psychoeducational Group Note  Date:  03/16/2017 Time:  1000 Group Topic/Focus:  Wellness Toolbox:   The focus of this group is to discuss various aspects of wellness, balancing those aspects and exploring ways to increase the ability to experience wellness.  Patients will create a wellness toolbox for use upon discharge.  Participation Level:  Active  Participation Quality:  Appropriate and Attentive  Affect:  Appropriate  Cognitive:  Alert and Appropriate  Insight: Good  Engagement in Group:  Engaged  Modes of Intervention:  Discussion and Education  Additional Comments:  Pt attended group and shared that she was exited to be going home today.  Anna Nguyen E 03/16/2017, 3:32 PM

## 2017-03-16 NOTE — Progress Notes (Signed)
On recheck, patient's CBG currently 152. Patient with no distress noted.
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
## 2017-03-16 NOTE — Progress Notes (Signed)
  [facility] Discharge [facility] :  Will you be returning to the same living situation after discharge:  Yes,  pt returning home. At discharge, do you have transportation home?: Yes,  pt has access to transportation. Do you have the ability to pay for your medications: Yes,  pt has insurance.  Release of information consent forms completed and in the chart;  Patient's signature needed at discharge.  Patient to Follow up at: Follow-up Information    [facility] 03[facility]/2017.   Why:  Medication management appointment at 8:30am with Dr. Dareen PianoAnderson. Contact information: [address] [place] [address] [phone]        [facility] Follow up on 03/25/2017.   Why:  [facility]. Please call the office to pay a $20 dollar deposit. If the deposit is not made, the appointment will be canceled. Contact information: [address] [place] [address] [phone]        [facility] Follow up.   Specialty:  Behavioral Health Why:  If you have any mental health concerns before your scheduled appointments, [facility]. Walk-in hours are Mon-Fri 8am-3pm. Please arrive as early as possible to be sure that you are seen. Thank you. Contact information: [address] [place] [address] [phone]           Next level of care provider has access to [facility] Link:no  Safety Planning and Suicide Prevention discussed: Yes,  with pt and with pt's husband.  Have you used any form of tobacco in the last 30 days? (Cigarettes, Smokeless Tobacco, Cigars, and/or Pipes): No  Has patient been referred to the Quitline?: N/A patient is not a smoker  Patient has been referred for addiction treatment: N/A  Anna JordanLynn B Bolivar Nguyen, MSW, LCSWA  03/16/2017, 11:25 AM

## 2017-03-16 NOTE — [facility] Group Notes (Signed)
[facility] LCSW Group Therapy  03/16/2017 1:15pm  Type of Therapy: Group Therapy   Topic: Overcoming Obstacles  Participation Level: Pt invited. Did not attend.  Deaunna Olarte B Jaquesha Boroff, MSW, LCSWA [phone]    

## 2017-03-16 NOTE — Progress Notes (Signed)
Discharge note: Pt received both written and verbal discharge instructions. Pt verbalized understanding of discharge instructions. Pt agreed to f/u appt and med regimen. Pt received prescriptions, SRA, AVS and transitional record. Pt gathered belongings from room. No items in locker. Pt safely discharged to the lobby.

## 2017-03-16 NOTE — Tx Team (Signed)
Interdisciplinary Treatment and Diagnostic Plan Update 03/16/2017 Time of Session: 9:30am  Anna DanielsCharisse R Braxton  MRN: [id number]  Principal Diagnosis: Major depressive disorder, recurrent severe without psychotic features (HCC)  Secondary Diagnoses: Principal Problem:   Major depressive disorder, recurrent severe without psychotic features (HCC) Active Problems:   Pregnancy   Current Medications:  Current Facility-Administered Medications  Medication Dose Route Frequency Provider Last Rate Last Dose  . bacitracin ointment   Topical PRN Charm RingsLord, Jamison Y, NP      . escitalopram (LEXAPRO) tablet 5 mg  5 mg Oral QHS Truman HaywardStarkes, Takia S, FNP   5 mg at 03/15/17 2247  . hydrOXYzine (ATARAX/VISTARIL) tablet 25 mg  25 mg Oral Q6H PRN Charm RingsLord, Jamison Y, NP   25 mg at 03/15/17 1717  . hydrOXYzine (ATARAX/VISTARIL) tablet 50 mg  50 mg Oral QHS PRN Armandina StammerNwoko, Agnes I, NP   50 mg at 03/15/17 2247  . promethazine (PHENERGAN) tablet 12.5 mg  12.5 mg Oral Q8H PRN Truman HaywardStarkes, Takia S, FNP   12.5 mg at 03/16/17 0802    PTA Medications: Prescriptions Prior to Admission  Medication Sig Dispense Refill Last Dose  . ondansetron (ZOFRAN ODT) 4 MG disintegrating tablet Take 1 tablet (4 mg total) by mouth every 8 (eight) hours as needed for nausea or vomiting. (Patient not taking: Reported on 03/12/2017) 20 tablet 0 Not Taking at Unknown time  . promethazine (PHENERGAN) 25 MG tablet Take 1 tablet (25 mg total) by mouth every 6 (six) hours as needed for nausea or vomiting. 30 tablet 0 Past Week at Unknown time  . ranitidine (ZANTAC) 150 MG capsule Take 1 capsule (150 mg total) by mouth 2 (two) times daily. (Patient not taking: Reported on 03/12/2017) 60 capsule 1 Not Taking at Unknown time  . sertraline (ZOLOFT) 50 MG tablet TK 1 T PO QD  3 03/11/2017 at Unknown time    Treatment Modalities: Medication Management, Group therapy, Case management,  1 to 1 session with clinician, Psychoeducation, Recreational therapy.  Patient  Stressors: Marital or family conflict Patient Strengths: Ability for insight Average or above average intelligence Capable of independent living General fund of knowledge Motivation for treatment/growth  Physician Treatment Plan for Primary Diagnosis: Major depressive disorder, recurrent severe without psychotic features (HCC) Long Term Goal(s): Improvement in symptoms so as ready for discharge Short Term Goals: Ability to identify changes in lifestyle to reduce recurrence of condition will improve Ability to verbalize feelings will improve Ability to disclose and discuss suicidal ideas Ability to demonstrate self-control will improve Ability to identify and develop effective coping behaviors will improve Ability to maintain clinical measurements within normal limits will improve Compliance with prescribed medications will improve Ability to identify triggers associated with substance abuse/mental health issues will improve  Medication Management: Evaluate patient's response, side effects, and tolerance of medication regimen.  Therapeutic Interventions: 1 to 1 sessions, Unit Group sessions and Medication administration.  Evaluation of Outcomes: Adequate for Discharge  Physician Treatment Plan for Secondary Diagnosis: Principal Problem:   Major depressive disorder, recurrent severe without psychotic features (HCC) Active Problems:   Pregnancy  Long Term Goal(s): Improvement in symptoms so as ready for discharge  Short Term Goals: Ability to identify changes in lifestyle to reduce recurrence of condition will improve Ability to verbalize feelings will improve Ability to disclose and discuss suicidal ideas Ability to demonstrate self-control will improve Ability to identify and develop effective coping behaviors will improve Ability to maintain clinical measurements within normal limits will improve Compliance with prescribed  medications will improve Ability to identify triggers  associated with substance abuse/mental health issues will improve  Medication Management: Evaluate patient's response, side effects, and tolerance of medication regimen.  Therapeutic Interventions: 1 to 1 sessions, Unit Group sessions and Medication administration.  Evaluation of Outcomes: Adequate for Discharge  RN Treatment Plan for Primary Diagnosis: Major depressive disorder, recurrent severe without psychotic features (HCC) Long Term Goal(s): Knowledge of disease and therapeutic regimen to maintain health will improve  Short Term Goals: Compliance with prescribed medications will improve  Medication Management: RN will administer medications as ordered by provider, will assess and evaluate patient's response and provide education to patient for prescribed medication. RN will report any adverse and/or side effects to prescribing provider.  Therapeutic Interventions: 1 on 1 counseling sessions, Psychoeducation, Medication administration, Evaluate responses to treatment, Monitor vital signs and CBGs as ordered, Perform/monitor CIWA, COWS, AIMS and Fall Risk screenings as ordered, Perform wound care treatments as ordered.  Evaluation of Outcomes: Adequate for Discharge  LCSW Treatment Plan for Primary Diagnosis: Major depressive disorder, recurrent severe without psychotic features (HCC) Long Term Goal(s): Safe transition to appropriate next level of care at discharge, Engage patient in therapeutic group addressing interpersonal concerns. Short Term Goals: Engage patient in aftercare planning with referrals and resources, Increase emotional regulation, Identify triggers associated with mental health/substance abuse issues and Increase skills for wellness and recovery  Therapeutic Interventions: Assess for all discharge needs, 1 to 1 time with Social worker, Explore available resources and support systems, Assess for adequacy in community support network, Educate family and significant  other(s) on suicide prevention, Complete Psychosocial Assessment, Interpersonal group therapy.  Evaluation of Outcomes: Adequate for Discharge  Progress in Treatment: Attending groups: Yes  Participating in groups: Minimally  Taking medication as prescribed: Yes, MD continues to assess for medication changes as needed Toleration medication: Yes, no side effects reported at this time Family/Significant other contact made: Yes, pt's husband has been contacted. Patient understands diagnosis: Yes, AEB pt's willingness to participate in treatment. Discussing patient identified problems/goals with staff: Yes Medical problems stabilized or resolved: Yes Denies suicidal/homicidal ideation: Yes Issues/concerns per patient self-inventory: None Other: N/A  New problem(s) identified: None identified at this time.   New Short Term/Long Term Goal(s): None identified at this time.   Discharge Plan or Barriers: Pt will return home and follow up outpatient with [facility].  Reason for Continuation of Hospitalization:  None identified at this time.  Estimated Length of Stay: 0 days  Attendees: Patient: 03/16/2017 11:20 AM  Physician: Dr. Jama Flavors 03/16/2017 11:20 AM  Nursing:  03/16/2017 11:20 AM  RN Care Manager:  03/16/2017 11:20 AM  Social Worker: Donnelly Stager, LCSWA 03/16/2017 11:20 AM  Recreational Therapist:  03/16/2017 11:20 AM  Other: Armandina Stammer, NP 03/16/2017 11:20 AM  Other:  03/16/2017 11:20 AM  Other: 03/16/2017 11:20 AM   Scribe for Treatment Team: Jonathon Jordan, MSW,LCSWA 03/16/2017 11:20 AM

## 2017-03-16 NOTE — Progress Notes (Signed)
Recreation Therapy Notes  Date: 03/16/17 Time: 0930 Location: [address] Dayroom  Group Topic: Stress Management  Goal Area(s) Addresses:  Patient will verbalize importance of using healthy stress management.  Patient will identify positive emotions associated with healthy stress management.   Intervention: Stress Management  Activity :  Guided Imagery.  LRT introduced the stress management technique of guided imagery.  LRT read a script that allowed patients to take a mental vacation through a summer meadow.  Patients were to follow along as script was read to engage in the activity.  Education: Stress Management, Discharge Planning.   Education Outcome: Acknowledges edcuation/In group clarification offered/Needs additional education  Clinical Observations/Feedback: Pt did not attend group.   Caroll RancherMarjette Meagen Limones, LRT/CTRS        Lillia AbedLindsay, Setareh Rom A 03/16/2017 1:30 PM

## 2017-03-16 NOTE — BHH Suicide Risk Assessment (Addendum)
[facility] Discharge Suicide Risk Assessment   Principal Problem: Major depressive disorder, recurrent severe without psychotic features [facility](HCC) Discharge Diagnoses:  Patient Active Problem List   Diagnosis Date Noted  . Major depressive disorder, recurrent severe without psychotic features (HCC) [F33.2] 03/13/2017  . Pregnancy [Z34.90] 06/10/2016  . Severe hyperemesis gravidarum [O21.1] 03/25/2016  . Severe nausea and vomiting [R11.2] 01/16/2016  . Anemia affecting pregnancy in second trimester [O99.012] 01/16/2016  . 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 Hyperglycemia [R73.9] 08/22/2013  . Vitamin D deficiency [E55.9] 07/15/2013  . PCOS (polycystic ovarian syndrome) [E28.2] 02/28/2013  . B12 deficiency anemia [D51.9] 07/14/2012  . Folate deficiency anemia [D52.9] 07/14/2012  . Contraception management [Z30.9] 05/06/2011  . Gastric bypass status for obesity [Z98.84] 05/06/2011  . Iron deficiency anemia [D50.9] 10/02/2009    Total Time spent with patient: 30 minutes  Musculoskeletal: Strength & Muscle Tone: within normal limits Gait & Station: normal Patient leans: N/A  Psychiatric Specialty Exam: ROS no headache, no chest pain, no shortness of breath, no bleeding , states she has felt fetal movements daily  Blood pressure 111/75, pulse 98, temperature 98.5 F (36.9 C), temperature source Oral, resp. rate 18, height 5\' 9"  (1.753 m), weight 103.9 kg (229 lb), last menstrual period 10/16/2016, unknown if currently breastfeeding.Body mass index is 33.82 kg/m.  General Appearance: Well Groomed  Eye Contact::  Good  Speech:  Normal Rate409  Volume:  Normal  Mood:  denies feeling depressed, states " mood is good right now "  Affect:  Appropriate and reactive  Thought Process:  Linear and Descriptions of Associations: Intact  Orientation:  Full (Time, Place, and Person)  Thought Content:  no hallucinations, no delusions, not internally preoccupied   Suicidal Thoughts:  No  denies any suicidal or self injurious ideations, no homicidal or violent or homicidal ideations  Homicidal Thoughts:  No  Memory:  recent and remote grossly intact   Judgement:  Other:  improvement  Insight:  improving   Psychomotor Activity:  Normal  Concentration:  Good  Recall:  Good  Fund of Knowledge:Good  Language: Good  Akathisia:  Negative  Handed:  Right  AIMS (if indicated):     Assets:  Communication Skills Desire for Improvement Resilience Social Support  Sleep:  Number of Hours: 6.25  Cognition: WNL  ADL's:  Intact   Mental Status Per Nursing Assessment::   On Admission:     Demographic Factors:  31 year old married female, has two step children, one biological child, currently 2nd trimester of pregnancy, employed   Loss Factors: Marital conflict   Historical Factors: No prior psychiatric admissions, no prior suicidal attempts   Risk Reduction Factors:   Responsible for children under 31 years of age, Sense of responsibility to family, Employed, Living with another person, especially a relative and Positive coping skills or problem solving skills  Continued Clinical Symptoms:  At this time patient reports she feels " back to normal". Mood is "OK" denies feeling depressed, affect is appropriate, reactive, no thought disorder, no suicidal or self injurious ideations, no homicidal ideations, specifically also denies any homicidal or violent ideations towards her husband. No hallucinations, no delusions, future oriented. Denies medication side effects- we reviewed risks/benefits regarding her current pregnancy status , including potential neonatal withdrawal symptoms as a risk of SSRI treatment during pregnancy . Patient is not currently on a prenatal vitamin- I have asked her about this and offered to start it. She states she had significant nausea/vomiting during prior  pregnancy made worse by vitamins, so she prefers not to take it. I have encouraged her to review  this , discuss possible options with her Ob/Gyn .  Behavior on unit calm and in good control. Of note, patient states that she has had good conversations with her husband and that they have been able to work things out and are committed to each other at this time.  Cognitive Features That Contribute To Risk:  No gross cognitive deficits noted upon discharge. Is alert , attentive, and oriented x 3  Suicide Risk:  Mild:  Suicidal ideation of limited frequency, intensity, duration, and specificity.  There are no identifiable plans, no associated intent, mild dysphoria and related symptoms, good self-control (both objective and subjective assessment), few other risk factors, and identifiable protective factors, including available and accessible social support.  Follow-up Information    Ob/Gyn, [facility] Follow up.   Why:  Social worker will confirm your next appointment date and time. Contact information: [address] [place] [address] [phone]           Plan Of Care/Follow-up recommendations:  Activity:  as tolerated  Diet:  regular Tests:  NA Other:  see below  Patient is requesting discharge and there are no current grounds for involuntary commitment . States her husband will be picking her up later today, and plans to return home. She has an established Conservation officer, historic buildings, Dr. Dareen Piano, at [facility]. She has upcoming appointment . States her OB will continue to prescribe her medications, also is referred to [facility] for therapy.   Craige Cotta, MD 03/16/2017, 8:51 AM

## 2017-03-18 ENCOUNTER — Inpatient Hospital Stay (HOSPITAL_COMMUNITY)
Admission: AD | Admit: 2017-03-18 | Discharge: 2017-03-18 | Disposition: A | Discharge status: Home / Self Care | Payer: 59 | Source: Ambulatory Visit | Attending: Obstetrics and Gynecology | Admitting: Obstetrics and Gynecology

## 2017-03-18 DIAGNOSIS — Z3A21 21 weeks gestation of pregnancy: Secondary | ICD-10-CM | POA: Diagnosis not present

## 2017-03-18 DIAGNOSIS — E282 Polycystic ovarian syndrome: Secondary | ICD-10-CM | POA: Diagnosis not present

## 2017-03-18 DIAGNOSIS — D509 Iron deficiency anemia, unspecified: Secondary | ICD-10-CM | POA: Insufficient documentation

## 2017-03-18 DIAGNOSIS — O2492 Unspecified diabetes mellitus in childbirth: Secondary | ICD-10-CM | POA: Diagnosis not present

## 2017-03-18 DIAGNOSIS — Z8249 Family history of ischemic heart disease and other diseases of the circulatory system: Secondary | ICD-10-CM | POA: Diagnosis not present

## 2017-03-18 DIAGNOSIS — R112 Nausea with vomiting, unspecified: Secondary | ICD-10-CM | POA: Insufficient documentation

## 2017-03-18 DIAGNOSIS — O26892 Other specified pregnancy related conditions, second trimester: Secondary | ICD-10-CM | POA: Diagnosis present

## 2017-03-18 DIAGNOSIS — O99012 Anemia complicating pregnancy, second trimester: Secondary | ICD-10-CM | POA: Diagnosis not present

## 2017-03-18 DIAGNOSIS — O3482 Maternal care for other abnormalities of pelvic organs, second trimester: Secondary | ICD-10-CM | POA: Diagnosis not present

## 2017-03-18 DIAGNOSIS — O99612 Diseases of the digestive system complicating pregnancy, second trimester: Secondary | ICD-10-CM | POA: Diagnosis not present

## 2017-03-18 DIAGNOSIS — Z833 Family history of diabetes mellitus: Secondary | ICD-10-CM | POA: Diagnosis not present

## 2017-03-18 DIAGNOSIS — O162 Unspecified maternal hypertension, second trimester: Secondary | ICD-10-CM | POA: Insufficient documentation

## 2017-03-18 DIAGNOSIS — O9989 Other specified diseases and conditions complicating pregnancy, childbirth and the puerperium: Secondary | ICD-10-CM

## 2017-03-18 DIAGNOSIS — K529 Noninfective gastroenteritis and colitis, unspecified: Secondary | ICD-10-CM

## 2017-03-18 DIAGNOSIS — M546 Pain in thoracic spine: Secondary | ICD-10-CM | POA: Diagnosis present

## 2017-03-18 DIAGNOSIS — R1010 Upper abdominal pain, unspecified: Secondary | ICD-10-CM | POA: Diagnosis present

## 2017-03-18 LAB — URINALYSIS, ROUTINE W REFLEX MICROSCOPIC
BILIRUBIN URINE: NEGATIVE
Glucose, UA: NEGATIVE mg/dL
Hgb urine dipstick: NEGATIVE
Ketones, ur: 20 mg/dL — AB
Nitrite: NEGATIVE
PH: 5 (ref 5.0–8.0)
Protein, ur: 100 mg/dL — AB
SPECIFIC GRAVITY, URINE: 1.029 (ref 1.005–1.030)

## 2017-03-18 LAB — COMPREHENSIVE METABOLIC PANEL
ALK PHOS: 75 U/L (ref 38–126)
ALT: 9 U/L — AB (ref 14–54)
AST: 12 U/L — ABNORMAL LOW (ref 15–41)
Albumin: 3.7 g/dL (ref 3.5–5.0)
Anion gap: 8 (ref 5–15)
BILIRUBIN TOTAL: 0.4 mg/dL (ref 0.3–1.2)
BUN: 7 mg/dL (ref 6–20)
CALCIUM: 9.1 mg/dL (ref 8.9–10.3)
CO2: 22 mmol/L (ref 22–32)
CREATININE: 0.57 mg/dL (ref 0.44–1.00)
Chloride: 103 mmol/L (ref 101–111)
GFR calc non Af Amer: >60 mL/min (ref >60)
GLUCOSE: 117 mg/dL — AB (ref 65–99)
Potassium: 3.3 mmol/L — ABNORMAL LOW (ref 3.5–5.1)
SODIUM: 133 mmol/L — AB (ref 135–145)
TOTAL PROTEIN: 7.9 g/dL (ref 6.5–8.1)

## 2017-03-18 LAB — CBC WITH DIFFERENTIAL/PLATELET
Basophils Absolute: 0 10*3/uL (ref 0.0–0.1)
Basophils Relative: 0 %
EOS ABS: 0 10*3/uL (ref 0.0–0.7)
Eosinophils Relative: 0 %
HEMATOCRIT: 32.4 % — AB (ref 36.0–46.0)
HEMOGLOBIN: 10.4 g/dL — AB (ref 12.0–15.0)
LYMPHS ABS: 1.4 10*3/uL (ref 0.7–4.0)
LYMPHS PCT: 17 %
MCH: 28.3 pg (ref 26.0–34.0)
MCHC: 32.1 g/dL (ref 30.0–36.0)
MCV: 88 fL (ref 78.0–100.0)
MONOS PCT: 2 %
Monocytes Absolute: 0.2 10*3/uL (ref 0.1–1.0)
NEUTROS ABS: 6.9 10*3/uL (ref 1.7–7.7)
NEUTROS PCT: 81 %
Platelets: 181 10*3/uL (ref 150–400)
RBC: 3.68 MIL/uL — AB (ref 3.87–5.11)
RDW: 13.7 % (ref 11.5–15.5)
WBC: 8.5 10*3/uL (ref 4.0–10.5)

## 2017-03-18 LAB — LIPASE, BLOOD: Lipase: 26 U/L (ref 11–51)

## 2017-03-18 LAB — GLUCOSE, CAPILLARY: GLUCOSE-CAPILLARY: 100 mg/dL — AB (ref 65–99)

## 2017-03-18 MED ORDER — LACTATED RINGERS IV BOLUS (SEPSIS)
1000.0000 mL | Freq: Once | INTRAVENOUS | Status: AC
  Administered 2017-03-18: 1000 mL via INTRAVENOUS

## 2017-03-18 MED ORDER — PROMETHAZINE HCL 25 MG RE SUPP: 25.0000 mg | Freq: Four times a day (QID) | RECTAL | 1 refills | Status: DC | PRN

## 2017-03-18 MED ORDER — METOCLOPRAMIDE HCL 5 MG/ML IJ SOLN
10.0000 mg | Freq: Once | INTRAMUSCULAR | Status: AC
  Administered 2017-03-18: 10 mg via INTRAVENOUS
  Filled 2017-03-18: qty 2

## 2017-03-18 MED ORDER — ONDANSETRON HCL 4 MG/2ML IJ SOLN
4.0000 mg | Freq: Once | INTRAMUSCULAR | Status: AC
  Administered 2017-03-18: 4 mg via INTRAVENOUS
  Filled 2017-03-18: qty 2

## 2017-03-18 MED ORDER — FAMOTIDINE IN NACL 20-0.9 MG/50ML-% IV SOLN
20.0000 mg | Freq: Once | INTRAVENOUS | Status: AC
  Administered 2017-03-18: 20 mg via INTRAVENOUS
  Filled 2017-03-18: qty 50

## 2017-03-18 MED ORDER — ONDANSETRON 8 MG PO TBDP: 8.0000 mg | ORAL_TABLET | Freq: Once | ORAL | Status: DC

## 2017-03-18 MED ORDER — ACETAMINOPHEN 500 MG PO TABS
1000.0000 mg | ORAL_TABLET | Freq: Once | ORAL | Status: DC
  Filled 2017-03-18: qty 2

## 2017-03-18 MED ORDER — PROMETHAZINE HCL 25 MG/ML IJ SOLN
25.0000 mg | Freq: Once | INTRAMUSCULAR | Status: AC
  Administered 2017-03-18: 25 mg via INTRAVENOUS
  Filled 2017-03-18: qty 1

## 2017-03-18 NOTE — Discharge Instructions (Signed)

## 2017-03-18 NOTE — MAU Note (Signed)
Pt C/O severe lower back & mid abdominal pain, started yesterday, is worse today.  Denies bleeding or LOF.  Has been vomiting since this morning.  Vomited up nausea med.

## 2017-03-18 NOTE — MAU Provider Note (Signed)
History     CSN: [id number]  Arrival date and time: 03/18/17 1839   First Provider Initiated Contact with Patient 03/18/17 1914      Chief Complaint  Patient presents with  . Abdominal Pain  . Back Pain  . Emesis   HPI [name] is a 31 y.o. [id number] at [redacted]w[redacted]d who presents to MAU today with complaint of N/V, upper abdominal pain and mid back pain x 2 days. The patient denies diarrhea, fever, sick contact, vaginal bleeding, LOF or contractions. She reports normal FM. She has DM and is diet controlled.   OB History    Gravida Para Term Preterm AB Living   4 1 1   2 1    SAB TAB Ectopic Multiple Live Births     2   0 1      Past Medical History:  Diagnosis Date  . Anemia, iron deficiency   . HTN (hypertension)    with last pregnancy  . PCOS (polycystic ovarian syndrome)   . Type II or unspecified type diabetes mellitus without mention of complication, not stated as uncontrolled    not on insulin    Past Surgical History:  Procedure Laterality Date  . LAPAROSCOPIC GASTRIC BANDING    . TONSILLECTOMY      Family History  Problem Relation Age of Onset  . Arthritis Other   . Diabetes Other   . Hypertension Other   . Hyperlipidemia Other   . Prostate cancer Other   . Cancer Maternal Aunt        breast    Social History  Substance Use Topics  . Smoking status: Never Smoker  . Smokeless tobacco: Never Used  . Alcohol use No    Allergies:  Allergies  Allergen Reactions  . Aspirin Other (See Comments)    Reaction:  Unknown   . Nsaids Other (See Comments)    Reaction:  Unknown   . Latex Rash    Prescriptions Prior to Admission  Medication Sig Dispense Refill Last Dose  . CALCIUM PO Take 1 tablet by mouth every morning.   Past Week at Unknown time  . escitalopram (LEXAPRO) 5 MG tablet Take 1 tablet (5 mg total) by mouth at bedtime. 30 tablet 0 03/17/2017 at Unknown time  . hydrOXYzine (ATARAX/VISTARIL) 25 MG tablet Take 1 tablet (25 mg total) by  mouth every 6 (six) hours as needed for anxiety. Take 1 tablet as needed every 5 hours for anxiety. Take 2 tablets at bedtime as needed for sleep. 60 tablet 0 03/17/2017 at Unknown time  . promethazine (PHENERGAN) 25 MG tablet Take 1 tablet (25 mg total) by mouth every 6 (six) hours as needed for nausea or vomiting. 30 tablet 0 03/18/2017 at Unknown time    Review of Systems  Constitutional: Negative for fever.  Gastrointestinal: Positive for abdominal pain, nausea and vomiting. Negative for constipation and diarrhea.  Genitourinary: Negative for dysuria, frequency, urgency, vaginal bleeding and vaginal discharge.  Musculoskeletal: Positive for back pain.   Physical Exam   Blood pressure 135/79, pulse 88, temperature 98.6 F (37 C), temperature source Oral, resp. rate 20, last menstrual period 10/16/2016, unknown if currently breastfeeding.  Physical Exam  Nursing note and vitals reviewed. Constitutional: She is oriented to person, place, and time. She appears well-developed and well-nourished. No distress.  HENT:  Head: Normocephalic and atraumatic.  Cardiovascular: Normal rate.   Respiratory: Effort normal.  GI: Soft. She exhibits no distension and no mass. There is tenderness (  mild tenderness to palpation of the upper abdomen bialterally). There is no rebound, no guarding and no CVA tenderness.  Neurological: She is alert and oriented to person, place, and time.  Skin: Skin is warm and dry. No erythema.  Psychiatric: She has a normal mood and affect.    Results for orders placed or performed during the hospital encounter of 03/18/17 (from the past 24 hour(s))  Urinalysis, Routine w reflex microscopic     Status: Abnormal   Collection Time: 03/18/17  6:50 PM  Result Value Ref Range   Color, Urine YELLOW YELLOW   APPearance HAZY (A) CLEAR   Specific Gravity, Urine 1.029 1.005 - 1.030   pH 5.0 5.0 - 8.0   Glucose, UA NEGATIVE NEGATIVE mg/dL   Hgb urine dipstick NEGATIVE NEGATIVE    Bilirubin Urine NEGATIVE NEGATIVE   Ketones, ur 20 (A) NEGATIVE mg/dL   Protein, ur 161100 (A) NEGATIVE mg/dL   Nitrite NEGATIVE NEGATIVE   Leukocytes, UA TRACE (A) NEGATIVE   RBC / HPF 0-5 0 - 5 RBC/hpf   WBC, UA 0-5 0 - 5 WBC/hpf   Bacteria, UA FEW (A) NONE SEEN   Squamous Epithelial / LPF 6-30 (A) NONE SEEN   Mucous PRESENT   CBC with Differential/Platelet     Status: Abnormal   Collection Time: 03/18/17  6:59 PM  Result Value Ref Range   WBC 8.5 4.0 - 10.5 K/uL   RBC 3.68 (L) 3.87 - 5.11 MIL/uL   Hemoglobin 10.4 (L) 12.0 - 15.0 g/dL   HCT 09.632.4 (L) 04.536.0 - 40.946.0 %   MCV 88.0 78.0 - 100.0 fL   MCH 28.3 26.0 - 34.0 pg   MCHC 32.1 30.0 - 36.0 g/dL   RDW 81.113.7 91.411.5 - 78.215.5 %   Platelets 181 150 - 400 K/uL   Neutrophils Relative % 81 %   Neutro Abs 6.9 1.7 - 7.7 K/uL   Lymphocytes Relative 17 %   Lymphs Abs 1.4 0.7 - 4.0 K/uL   Monocytes Relative 2 %   Monocytes Absolute 0.2 0.1 - 1.0 K/uL   Eosinophils Relative 0 %   Eosinophils Absolute 0.0 0.0 - 0.7 K/uL   Basophils Relative 0 %   Basophils Absolute 0.0 0.0 - 0.1 K/uL  Comprehensive metabolic panel     Status: Abnormal   Collection Time: 03/18/17  6:59 PM  Result Value Ref Range   Sodium 133 (L) 135 - 145 mmol/L   Potassium 3.3 (L) 3.5 - 5.1 mmol/L   Chloride 103 101 - 111 mmol/L   CO2 22 22 - 32 mmol/L   Glucose, Bld 117 (H) 65 - 99 mg/dL   BUN 7 6 - 20 mg/dL   Creatinine, Ser 9.560.57 0.44 - 1.00 mg/dL   Calcium 9.1 8.9 - 21.310.3 mg/dL   Total Protein 7.9 6.5 - 8.1 g/dL   Albumin 3.7 3.5 - 5.0 g/dL   AST 12 (L) 15 - 41 U/L   ALT 9 (L) 14 - 54 U/L   Alkaline Phosphatase 75 38 - 126 U/L   Total Bilirubin 0.4 0.3 - 1.2 mg/dL   GFR calc non Af Amer >60 >60 mL/min   GFR calc Af Amer >60 >60 mL/min   Anion gap 8 5 - 15  Lipase, blood     Status: None   Collection Time: 03/18/17  6:59 PM  Result Value Ref Range   Lipase 26 11 - 51 U/L  Glucose, capillary     Status: Abnormal  Collection Time: 03/18/17  7:58 PM  Result Value  Ref Range   Glucose-Capillary 100 (H) 65 - 99 mg/dL    MAU Course  Procedures None  MDM FHR - 144 bpm with doppler CBC, CMP, Lipase IV LR bolus with 4 mg IV Zofran  Phenergan IV and Pepcid given Patient reports improvement in symptoms and is able to tolerate PO in MAU.  Discussed patient with Dr. Claiborne Billings. Ok for discharge when emesis is controlled with Rx for Phenergan suppositories. Continue esomeprazole as previously prescribed.  Patient states that she is having severe pain is insistent that she was given something last time she was here for food poisoning that fixed her pain and nausea quickly.  After review of her chart, she was given Pepcid, Zofran and Dilaudid.  Discussed this with Dr. Claiborne Billings, she does not want patient to have narcotic pain medication at this time.  10 mg Reglan given and 1000 mg Tylenol offered. Patient declined Tylenol.  2100 - Care turned over to [facility], CNM   Vonzella Nipple, PA-C 03/18/2017, 9:04 PM   Patient threw up her juice around 9pm, received IV reglan. Currently patient is resting in bed with her eyes closed; does not want to try juice at this time. Says that her back and stomach still hurt but she can "deal with it at this time."   Patient awake and states that she is ready to go. Plans to keep her appointment next week at Dr. Delray Alt office.   Assessment and Plan   1. Gastroenteritis    2. Patient stable for discharge; reviewed warning signs and when to return to the MAU. Recommended BRAT diet. Encouraged patient to return to MAU if she cannot keep anything down after trying  3. Patient verbalized understanding.   Luna Kitchens CNM

## 2017-05-04 ENCOUNTER — Telehealth: Payer: Self-pay

## 2017-05-04 NOTE — Telephone Encounter (Signed)
Prior pregnancy she was receiving iron infusions at [facility]. Last one was 05/06/16. She is pregnant again and due in November. Dr Dareen Piano believes she needs iron again. They are finding a spot for her in [place]. She is asking if she can get her iron infusions at [facility].

## 2017-05-04 NOTE — Telephone Encounter (Signed)
S/w pt that we would need new referral and earliest available is 9/14.

## 2017-05-04 NOTE — Telephone Encounter (Signed)
Patient has been non-compliant before If she wants treatment here, we need a new referral from PCP/GYN Also, earliest availability is 9/14, not sooner

## 2017-05-09 ENCOUNTER — Encounter (HOSPITAL_COMMUNITY): Payer: Self-pay

## 2017-05-09 ENCOUNTER — Inpatient Hospital Stay (HOSPITAL_COMMUNITY)
Admission: AD | Admit: 2017-05-09 | Discharge: 2017-05-09 | Disposition: A | Discharge status: Home / Self Care | Payer: 59 | Source: Ambulatory Visit | Attending: Obstetrics and Gynecology | Admitting: Obstetrics and Gynecology

## 2017-05-09 DIAGNOSIS — Z8042 Family history of malignant neoplasm of prostate: Secondary | ICD-10-CM | POA: Diagnosis not present

## 2017-05-09 DIAGNOSIS — Z9889 Other specified postprocedural states: Secondary | ICD-10-CM | POA: Diagnosis not present

## 2017-05-09 DIAGNOSIS — E282 Polycystic ovarian syndrome: Secondary | ICD-10-CM | POA: Diagnosis not present

## 2017-05-09 DIAGNOSIS — D508 Other iron deficiency anemias: Secondary | ICD-10-CM

## 2017-05-09 DIAGNOSIS — Z3A29 29 weeks gestation of pregnancy: Secondary | ICD-10-CM | POA: Insufficient documentation

## 2017-05-09 DIAGNOSIS — Z9884 Bariatric surgery status: Secondary | ICD-10-CM | POA: Diagnosis not present

## 2017-05-09 DIAGNOSIS — E162 Hypoglycemia, unspecified: Secondary | ICD-10-CM

## 2017-05-09 DIAGNOSIS — Z8261 Family history of arthritis: Secondary | ICD-10-CM | POA: Diagnosis not present

## 2017-05-09 DIAGNOSIS — O9989 Other specified diseases and conditions complicating pregnancy, childbirth and the puerperium: Secondary | ICD-10-CM

## 2017-05-09 DIAGNOSIS — O99283 Endocrine, nutritional and metabolic diseases complicating pregnancy, third trimester: Secondary | ICD-10-CM | POA: Diagnosis not present

## 2017-05-09 DIAGNOSIS — O163 Unspecified maternal hypertension, third trimester: Secondary | ICD-10-CM | POA: Diagnosis not present

## 2017-05-09 DIAGNOSIS — O99013 Anemia complicating pregnancy, third trimester: Secondary | ICD-10-CM | POA: Insufficient documentation

## 2017-05-09 DIAGNOSIS — Z803 Family history of malignant neoplasm of breast: Secondary | ICD-10-CM | POA: Insufficient documentation

## 2017-05-09 DIAGNOSIS — D509 Iron deficiency anemia, unspecified: Secondary | ICD-10-CM | POA: Diagnosis not present

## 2017-05-09 DIAGNOSIS — Z833 Family history of diabetes mellitus: Secondary | ICD-10-CM | POA: Insufficient documentation

## 2017-05-09 DIAGNOSIS — E11649 Type 2 diabetes mellitus with hypoglycemia without coma: Secondary | ICD-10-CM | POA: Diagnosis not present

## 2017-05-09 DIAGNOSIS — O26893 Other specified pregnancy related conditions, third trimester: Secondary | ICD-10-CM | POA: Insufficient documentation

## 2017-05-09 LAB — CBC WITH DIFFERENTIAL/PLATELET
BASOS ABS: 0 10*3/uL (ref 0.0–0.1)
BASOS PCT: 0 %
Eosinophils Absolute: 0.1 10*3/uL (ref 0.0–0.7)
Eosinophils Relative: 1 %
HEMATOCRIT: 27.9 % — AB (ref 36.0–46.0)
HEMOGLOBIN: 9.4 g/dL — AB (ref 12.0–15.0)
Lymphocytes Relative: 34 %
Lymphs Abs: 2.9 10*3/uL (ref 0.7–4.0)
MCH: 28.7 pg (ref 26.0–34.0)
MCHC: 33.7 g/dL (ref 30.0–36.0)
MCV: 85.1 fL (ref 78.0–100.0)
MONOS PCT: 6 %
Monocytes Absolute: 0.5 10*3/uL (ref 0.1–1.0)
NEUTROS ABS: 4.9 10*3/uL (ref 1.7–7.7)
NEUTROS PCT: 59 %
Platelets: 177 10*3/uL (ref 150–400)
RBC: 3.28 MIL/uL — AB (ref 3.87–5.11)
RDW: 14.4 % (ref 11.5–15.5)
WBC: 8.3 10*3/uL (ref 4.0–10.5)

## 2017-05-09 LAB — URINALYSIS, ROUTINE W REFLEX MICROSCOPIC
Glucose, UA: >=500 mg/dL — AB
Hgb urine dipstick: NEGATIVE
KETONES UR: 5 mg/dL — AB
Nitrite: NEGATIVE
PH: 5 (ref 5.0–8.0)
PROTEIN: 30 mg/dL — AB
Specific Gravity, Urine: 1.026 (ref 1.005–1.030)

## 2017-05-09 LAB — GLUCOSE, CAPILLARY
GLUCOSE-CAPILLARY: 75 mg/dL (ref 65–99)
Glucose-Capillary: 55 mg/dL — ABNORMAL LOW (ref 65–99)

## 2017-05-09 NOTE — Discharge Instructions (Signed)
Anemia, Nonspecific Anemia is a condition in which the concentration of red blood cells or hemoglobin in the blood is below normal. Hemoglobin is a substance in red blood cells that carries oxygen to the tissues of the body. Anemia results in not enough oxygen reaching these tissues. What are the causes? Common causes of anemia include:  Excessive bleeding. Bleeding may be internal or external. This includes excessive bleeding from periods (in women) or from the intestine.  Poor nutrition.  Chronic kidney, thyroid, and liver disease.  Bone marrow disorders that decrease red blood cell production.  Cancer and treatments for cancer.  HIV, AIDS, and their treatments.  Spleen problems that increase red blood cell destruction.  Blood disorders.  Excess destruction of red blood cells due to infection, medicines, and autoimmune disorders.  What are the signs or symptoms?  Minor weakness.  Dizziness.  Headache.  Palpitations.  Shortness of breath, especially with exercise.  Paleness.  Cold sensitivity.  Indigestion.  Nausea.  Difficulty sleeping.  Difficulty concentrating. Symptoms may occur suddenly or they may develop slowly. How is this diagnosed? Additional blood tests are often needed. These help your health care provider determine the best treatment. Your health care provider will check your stool for blood and look for other causes of blood loss. How is this treated? Treatment varies depending on the cause of the anemia. Treatment can include:  Supplements of iron, vitamin V56, or folic acid.  Hormone medicines.  A blood transfusion. This may be needed if blood loss is severe.  Hospitalization. This may be needed if there is significant continual blood loss.  Dietary changes.  Spleen removal.  Follow these instructions at home: Keep all follow-up appointments. It often takes many weeks to correct anemia, and having your health care provider check on  your condition and your response to treatment is very important. Get help right away if:  You develop extreme weakness, shortness of breath, or chest pain.  You become dizzy or have trouble concentrating.  You develop heavy vaginal bleeding.  You develop a rash.  You have bloody or black, tarry stools.  You faint.  You vomit up blood.  You vomit repeatedly.  You have abdominal pain.  You have a fever or persistent symptoms for more than 2-3 days.  You have a fever and your symptoms suddenly get worse.  You are dehydrated. This information is not intended to replace advice given to you by your health care provider. Make sure you discuss any questions you have with your health care provider. Document Released: 10/02/2004 Document Revised: 02/06/2016 Document Reviewed: 02/18/2013 Elsevier Interactive Patient Education  2017 [employer].  Hypoglycemia Hypoglycemia occurs when the level of sugar (glucose) in the blood is too low. Glucose is a type of sugar that provides the body's main source of energy. Certain hormones (insulin and glucagon) control the level of glucose in the blood. Insulin lowers blood glucose, and glucagon increases blood glucose. Hypoglycemia can result from having too much insulin in the bloodstream, or from not eating enough food that contains glucose. Hypoglycemia can happen in people who do or do not have diabetes. It can develop quickly, and it can be a medical emergency. What are the causes? Hypoglycemia occurs most often in people who have diabetes. If you have diabetes, hypoglycemia may be caused by:  Diabetes medicine.  Not eating enough, or not eating often enough.  Increased physical activity.  Drinking alcohol, especially when you have not eaten recently.  If you do not  have diabetes, hypoglycemia may be caused by:  A tumor in the pancreas. The pancreas is the organ that makes insulin.  Not eating enough, or not eating for long periods at  a time (fasting).  Severe infection or illness that affects the liver, heart, or kidneys.  Certain medicines.  You may also have reactive hypoglycemia. This condition causes hypoglycemia within 4 hours of eating a meal. This may occur after having stomach surgery. Sometimes, the cause of reactive hypoglycemia is not known. What increases the risk? Hypoglycemia is more likely to develop in:  People who have diabetes and take medicines to lower blood glucose.  People who abuse alcohol.  People who have a severe illness.  What are the signs or symptoms? Hypoglycemia may not cause any symptoms. If you have symptoms, they may include:  Hunger.  Anxiety.  Sweating and feeling clammy.  Confusion.  Dizziness or feeling light-headed.  Sleepiness.  Nausea.  Increased heart rate.  Headache.  Blurry vision.  Seizure.  Nightmares.  Tingling or numbness around the mouth, lips, or tongue.  A change in speech.  Decreased ability to concentrate.  A change in coordination.  Restless sleep.  Tremors or shakes.  Fainting.  Irritability.  How is this diagnosed? Hypoglycemia is diagnosed with a blood test to measure your blood glucose level. This blood test is done while you are having symptoms. Your health care provider may also do a physical exam and review your medical history. If you do not have diabetes, other tests may be done to find the cause of your hypoglycemia. How is this treated? This condition can often be treated by immediately eating or drinking something that contains glucose, such as:  3-4 sugar tablets (glucose pills).  Glucose gel, 15-gram tube.  Fruit juice, 4 oz (120 mL).  Regular soda (not diet soda), 4 oz (120 mL).  Low-fat milk, 4 oz (120 mL).  Several pieces of hard candy.  Sugar or honey, 1 Tbsp.  Treating Hypoglycemia If You Have Diabetes  If you are alert and able to swallow safely, follow the 15:15 rule:  Take 15 grams of a  rapid-acting carbohydrate. Rapid-acting options include: ? 1 tube of glucose gel. ? 3 glucose pills. ? 6-8 pieces of hard candy. ? 4 oz (120 mL) of fruit juice. ? 4 oz (120 ml) of regular (not diet) soda.  Check your blood glucose 15 minutes after you take the carbohydrate.  If the repeat blood glucose level is still at or below 70 mg/dL (3.9 mmol/L), take 15 grams of a carbohydrate again.  If your blood glucose level does not increase above 70 mg/dL (3.9 mmol/L) after 3 tries, seek emergency medical care.  After your blood glucose level returns to normal, eat a meal or a snack within 1 hour.  Treating Severe Hypoglycemia Severe hypoglycemia is when your blood glucose level is at or below 54 mg/dL (3 mmol/L). Severe hypoglycemia is an emergency. Do not wait to see if the symptoms will go away. Get medical help right away. Call your local emergency services (911 in the [place] Do not drive yourself to the hospital. If you have severe hypoglycemia and you cannot eat or drink, you may need an injection of glucagon. A family member or close friend should learn how to check your blood glucose and how to give you a glucagon injection. Ask your health care provider if you need to have an emergency glucagon injection kit available. Severe hypoglycemia may need to be treated in  a hospital. The treatment may include getting glucose through an IV tube. You may also need treatment for the cause of your hypoglycemia. Follow these instructions at home: General instructions  Avoid any diets that cause you to not eat enough food. Talk with your health care provider before you start any new diet.  Take over-the-counter and prescription medicines only as told by your health care provider.  Limit alcohol intake to no more than 1 drink per day for nonpregnant women and 2 drinks per day for men. One drink equals 12 oz of beer, 5 oz of wine, or 1 oz of hard liquor.  Keep all follow-up visits as told by your  health care provider. This is important. If You Have Diabetes:   Make sure you know the symptoms of hypoglycemia.  Always have a rapid-acting carbohydrate snack with you to treat low blood sugar.  Follow your diabetes management plan, as told by your health care provider. Make sure you: ? Take your medicines as directed. ? Follow your exercise plan. ? Follow your meal plan. Eat on time, and do not skip meals. ? Check your blood glucose as often as directed. Make sure to check your blood glucose before and after exercise. If you exercise longer or in a different way than usual, check your blood glucose more often. ? Follow your sick day plan whenever you cannot eat or drink normally. Make this plan in advance with your health care provider.  Share your diabetes management plan with people in your workplace, school, and household.  Check your urine for ketones when you are ill and as told by your health care provider.  Carry a medical alert card or wear medical alert jewelry. If You Have Reactive Hypoglycemia or Low Blood Sugar From Other Causes:  Monitor your blood glucose as told by your health care provider.  Follow instructions from your health care provider about eating or drinking restrictions. Contact a health care provider if:  You have problems keeping your blood glucose in your target range.  You have frequent episodes of hypoglycemia. Get help right away if:  You continue to have hypoglycemia symptoms after eating or drinking something containing glucose.  Your blood glucose is at or below 54 mg/dL (3 mmol/L).  You have a seizure.  You faint. These symptoms may represent a serious problem that is an emergency. Do not wait to see if the symptoms will go away. Get medical help right away. Call your local emergency services (911 in the [place] Do not drive yourself to the hospital. This information is not intended to replace advice given to you by your health care  provider. Make sure you discuss any questions you have with your health care provider. Document Released: 08/25/2005 Document Revised: 02/06/2016 Document Reviewed: 09/28/2015 Elsevier Interactive Patient Education  [employer].

## 2017-05-09 NOTE — MAU Provider Note (Signed)
History   G4P1021 @ 29.2 wks in with worsening bouts of hypoglycemia. States this was a problem with her last preg and worse this time she is now to the point she will just eat junk to keep up her glucose up. Also has anemia this pregnancy.  CSN: [id number]  Arrival date & time 05/09/17  1326   None     Chief Complaint  Patient presents with  . Shortness of Breath    HPI  Past Medical History:  Diagnosis Date  . Anemia, iron deficiency   . HTN (hypertension)    with last pregnancy  . PCOS (polycystic ovarian syndrome)   . Type II or unspecified type diabetes mellitus without mention of complication, not stated as uncontrolled    not on insulin    Past Surgical History:  Procedure Laterality Date  . LAPAROSCOPIC GASTRIC BANDING    . TONSILLECTOMY      Family History  Problem Relation Age of Onset  . Arthritis Other   . Diabetes Other   . Hypertension Other   . Hyperlipidemia Other   . Prostate cancer Other   . Cancer Maternal Aunt        breast    Social History  Substance Use Topics  . Smoking status: Never Smoker  . Smokeless tobacco: Never Used  . Alcohol use No    OB History    Gravida Para Term Preterm AB Living   4 1 1   2 1    SAB TAB Ectopic Multiple Live Births     2   0 1      Review of Systems  HENT: Negative.   Eyes: Negative.   Respiratory: Negative.   Cardiovascular: Negative.   Gastrointestinal: Negative.   Endocrine: Negative.   Genitourinary: Negative.   Musculoskeletal: Negative.   Skin: Negative.   Allergic/Immunologic: Negative.   Neurological: Positive for dizziness, tremors and weakness.  Hematological: Negative.   Psychiatric/Behavioral: Negative.     Allergies  Aspirin; Nsaids; and Latex  Home Medications    BP 130/80 (BP Location: Right Arm)   Pulse 88   Temp 98.6 F (37 C) (Oral)   Resp 16   Ht 5\' 9"  (1.753 m)   Wt 237 lb (107.5 kg)   LMP 10/16/2016   SpO2 99%   BMI 35.00 kg/m   Physical Exam   Constitutional: She is oriented to person, place, and time. She appears well-developed and well-nourished.  HENT:  Head: Normocephalic.  Eyes: Pupils are equal, round, and reactive to light.  Neck: Normal range of motion.  Cardiovascular: Normal rate, regular rhythm, normal heart sounds and intact distal pulses.   Pulmonary/Chest: Effort normal and breath sounds normal.  Abdominal: Soft. Bowel sounds are normal.  Genitourinary: Vagina normal and uterus normal.  Musculoskeletal: Normal range of motion.  Neurological: She is alert and oriented to person, place, and time. She has normal reflexes.  Skin: Skin is warm and dry.  Psychiatric: She has a normal mood and affect. Her behavior is normal. Judgment and thought content normal.    MAU Course  Procedures (including critical care time)  Labs Reviewed  URINALYSIS, ROUTINE W REFLEX MICROSCOPIC - Abnormal; Notable for the following:       Result Value   APPearance CLOUDY (*)    Glucose, UA >=500 (*)    Bilirubin Urine SMALL (*)    Ketones, ur 5 (*)    Protein, ur 30 (*)    Leukocytes, UA SMALL (*)  Bacteria, UA RARE (*)    Squamous Epithelial / LPF 6-30 (*)    All other components within normal limits  CBC WITH DIFFERENTIAL/PLATELET - Abnormal; Notable for the following:    RBC 3.28 (*)    Hemoglobin 9.4 (*)    HCT 27.9 (*)    All other components within normal limits  GLUCOSE, CAPILLARY - Abnormal; Notable for the following:    Glucose-Capillary 55 (*)    All other components within normal limits   No results found.   1. Hypoglycemia   2. Other iron deficiency anemia       MDM  VSS, CBG 55 on admit and now up to 75 since PB cracters and sprite. FHR pattern reassuring. POC discussed with Dr. Claiborne Billings. Pt to be d/c home and to f/u Tuesday in office

## 2017-05-09 NOTE — Progress Notes (Addendum)
G4P1 @ 29.[redacted] wksga. Presents to triage for dizziness, SOB, low sugar, and low BP. Denies LOF or bleeding. + FM. EFM applied.   1438: BS 55: sprite, crackers with peanut butter provided per provider. Lab at bs  BS recheck 75. Pt states feeling better after crackers and sprite.

## 2017-06-07 ENCOUNTER — Encounter (HOSPITAL_COMMUNITY): Payer: Self-pay

## 2017-06-07 ENCOUNTER — Inpatient Hospital Stay (HOSPITAL_COMMUNITY)
Admission: AD | Admit: 2017-06-07 | Discharge: 2017-06-07 | Disposition: A | Discharge status: Home / Self Care | Payer: 59 | Source: Ambulatory Visit | Attending: Obstetrics and Gynecology | Admitting: Obstetrics and Gynecology

## 2017-06-07 DIAGNOSIS — O163 Unspecified maternal hypertension, third trimester: Secondary | ICD-10-CM | POA: Insufficient documentation

## 2017-06-07 DIAGNOSIS — O99283 Endocrine, nutritional and metabolic diseases complicating pregnancy, third trimester: Secondary | ICD-10-CM | POA: Diagnosis not present

## 2017-06-07 DIAGNOSIS — E119 Type 2 diabetes mellitus without complications: Secondary | ICD-10-CM | POA: Insufficient documentation

## 2017-06-07 DIAGNOSIS — O26893 Other specified pregnancy related conditions, third trimester: Secondary | ICD-10-CM | POA: Diagnosis not present

## 2017-06-07 DIAGNOSIS — Z91040 Latex allergy status: Secondary | ICD-10-CM | POA: Insufficient documentation

## 2017-06-07 DIAGNOSIS — Z9889 Other specified postprocedural states: Secondary | ICD-10-CM | POA: Diagnosis not present

## 2017-06-07 DIAGNOSIS — R109 Unspecified abdominal pain: Secondary | ICD-10-CM | POA: Diagnosis not present

## 2017-06-07 DIAGNOSIS — O9989 Other specified diseases and conditions complicating pregnancy, childbirth and the puerperium: Secondary | ICD-10-CM

## 2017-06-07 DIAGNOSIS — Z803 Family history of malignant neoplasm of breast: Secondary | ICD-10-CM | POA: Diagnosis not present

## 2017-06-07 DIAGNOSIS — O24113 Pre-existing diabetes mellitus, type 2, in pregnancy, third trimester: Secondary | ICD-10-CM | POA: Insufficient documentation

## 2017-06-07 DIAGNOSIS — Z833 Family history of diabetes mellitus: Secondary | ICD-10-CM | POA: Insufficient documentation

## 2017-06-07 DIAGNOSIS — O99013 Anemia complicating pregnancy, third trimester: Secondary | ICD-10-CM | POA: Diagnosis not present

## 2017-06-07 DIAGNOSIS — Z9884 Bariatric surgery status: Secondary | ICD-10-CM | POA: Insufficient documentation

## 2017-06-07 DIAGNOSIS — Z832 Family history of diseases of the blood and blood-forming organs and certain disorders involving the immune mechanism: Secondary | ICD-10-CM | POA: Insufficient documentation

## 2017-06-07 DIAGNOSIS — Z886 Allergy status to analgesic agent status: Secondary | ICD-10-CM | POA: Insufficient documentation

## 2017-06-07 DIAGNOSIS — Z888 Allergy status to other drugs, medicaments and biological substances status: Secondary | ICD-10-CM | POA: Diagnosis not present

## 2017-06-07 DIAGNOSIS — Z8261 Family history of arthritis: Secondary | ICD-10-CM | POA: Insufficient documentation

## 2017-06-07 DIAGNOSIS — Z8042 Family history of malignant neoplasm of prostate: Secondary | ICD-10-CM | POA: Insufficient documentation

## 2017-06-07 DIAGNOSIS — Z3A33 33 weeks gestation of pregnancy: Secondary | ICD-10-CM | POA: Insufficient documentation

## 2017-06-07 DIAGNOSIS — O219 Vomiting of pregnancy, unspecified: Secondary | ICD-10-CM

## 2017-06-07 DIAGNOSIS — Z79899 Other long term (current) drug therapy: Secondary | ICD-10-CM | POA: Insufficient documentation

## 2017-06-07 DIAGNOSIS — N898 Other specified noninflammatory disorders of vagina: Secondary | ICD-10-CM | POA: Insufficient documentation

## 2017-06-07 LAB — WET PREP, GENITAL
CLUE CELLS WET PREP: NONE SEEN
Sperm: NONE SEEN
Trich, Wet Prep: NONE SEEN
Yeast Wet Prep HPF POC: NONE SEEN

## 2017-06-07 LAB — URINALYSIS, ROUTINE W REFLEX MICROSCOPIC
BILIRUBIN URINE: NEGATIVE
GLUCOSE, UA: NEGATIVE mg/dL
Hgb urine dipstick: NEGATIVE
KETONES UR: NEGATIVE mg/dL
NITRITE: NEGATIVE
PH: 7 (ref 5.0–8.0)
Protein, ur: NEGATIVE mg/dL
SPECIFIC GRAVITY, URINE: 1.004 — AB (ref 1.005–1.030)

## 2017-06-07 MED ORDER — DOXYLAMINE-PYRIDOXINE 10-10 MG PO TBEC: 1.0000 | DELAYED_RELEASE_TABLET | Freq: Every day | ORAL | 0 refills | Status: DC

## 2017-06-07 MED ORDER — LACTATED RINGERS IV BOLUS (SEPSIS)
500.0000 mL | Freq: Once | INTRAVENOUS | Status: AC
  Administered 2017-06-07: 500 mL via INTRAVENOUS

## 2017-06-07 MED ORDER — METOCLOPRAMIDE HCL 5 MG/ML IJ SOLN
5.0000 mg | Freq: Once | INTRAMUSCULAR | Status: AC
  Administered 2017-06-07: 5 mg via INTRAVENOUS
  Filled 2017-06-07: qty 2

## 2017-06-07 NOTE — MAU Note (Signed)
Reports Anna Nguyen have been getting worse over the past few days. States she drink 3-4 large water bottles per day. Some new onset nausea around the same time. No bleeding, no LOF. Reports good fetal movement.

## 2017-06-07 NOTE — MAU Provider Note (Signed)
History     CSN: [id number]  Arrival date and time: 06/07/17 0808   None     Chief Complaint  Patient presents with  . Nausea  . Contractions   31 yo G4P1021 at [redacted]w[redacted]d here for "braxton hicks" contractions x 2 days. Patient unable to tell me how frequently contractions occur. She admits to nausea and vomiting x 4 times yesterday. Tried phenergan at home but did not help. She feels her appetite is decreased. Patient admits to some vaginal discharge as well. She is not concerned for STD exposure.     OB History    Gravida Para Term Preterm AB Living   SAB TAB Ectopic Multiple Live Births     2   0 1      Past Medical History:  Diagnosis Date  . Anemia, iron deficiency   . HTN (hypertension)    with last pregnancy  . PCOS (polycystic ovarian syndrome)   . Type II or unspecified type diabetes mellitus without mention of complication, not stated as uncontrolled    not on insulin    Past Surgical History:  Procedure Laterality Date  . LAPAROSCOPIC GASTRIC BANDING    . TONSILLECTOMY      Family History  Problem Relation Age of Onset  . Arthritis Other   . Diabetes Other   . Hypertension Other   . Hyperlipidemia Other   . Prostate cancer Other   . Cancer Maternal Aunt        breast    Social History  Substance Use Topics  . Smoking status: Never Smoker  . Smokeless tobacco: Never Used  . Alcohol use No    Allergies:  Allergies  Allergen Reactions  . Aspirin Other (See Comments)    Reaction:  Unknown   . Nsaids Other (See Comments)    Reaction:  Unknown   . Latex Rash    Prescriptions Prior to Admission  Medication Sig Dispense Refill Last Dose  . escitalopram (LEXAPRO) 10 MG tablet Take 10 mg by mouth at bedtime.   05/08/2017 at Unknown time  . esomeprazole (NEXIUM) 40 MG capsule Take 40 mg by mouth daily as needed (For heartburn.).   Past Month at Unknown time  . hydrOXYzine (ATARAX/VISTARIL) 25 MG tablet Take 1 tablet (25 mg total) by  mouth every 6 (six) hours as needed for anxiety. Take 1 tablet as needed every 5 hours for anxiety. Take 2 tablets at bedtime as needed for sleep. 60 tablet 0 05/09/2017 at Unknown time  . promethazine (PHENERGAN) 25 MG suppository Place 1 suppository (25 mg total) rectally every 6 (six) hours as needed for nausea. (Patient not taking: Reported on 05/09/2017) 12 suppository 1 Not Taking at Unknown time  . promethazine (PHENERGAN) 25 MG tablet Take 1 tablet (25 mg total) by mouth every 6 (six) hours as needed for nausea or vomiting. 30 tablet 0 Past Month at Unknown time    Review of Systems  Constitutional: Positive for activity change. Negative for fatigue and fever.  HENT: Negative for congestion and ear discharge.   Eyes: Negative for pain and itching.  Respiratory: Negative for shortness of breath and wheezing.   Cardiovascular: Negative for chest pain and palpitations.  Gastrointestinal: Positive for abdominal pain, nausea and vomiting. Negative for diarrhea.  Endocrine: Negative for cold intolerance and heat intolerance.  Genitourinary: Positive for vaginal discharge. Negative for dysuria and vaginal bleeding.  Neurological: Negative for dizziness and syncope.  Physical Exam   Blood pressure 117/67, pulse 91, temperature 98 F (36.7 C), temperature source Oral, resp. rate 18, last menstrual period 10/16/2016, unknown if currently breastfeeding.  Physical Exam  Constitutional: She is oriented to person, place, and time. She appears well-developed and well-nourished. No distress.  HENT:  Head: Normocephalic and atraumatic.  Eyes: Pupils are equal, round, and reactive to light. Conjunctivae are normal.  Neck: Normal range of motion. Neck supple.  Cardiovascular: Normal rate and regular rhythm.   Respiratory: Effort normal. No respiratory distress.  GI: Soft. There is no tenderness.  Gravid   Genitourinary: Vagina normal.  Musculoskeletal: Normal range of motion. She exhibits no  edema.  Neurological: She is alert and oriented to person, place, and time.  Skin: Skin is warm and dry.  Psychiatric: She has a normal mood and affect. Her behavior is normal.    MAU Course  Procedures  MDM Toco shows no contractions at this time, so deferred cervical exam. Will trial IVF bolus and IV anti-emetic. Will also collect wet prep.   Wet prep unremarkable.  Assessment and Plan  1. Nausea/vomiting in pregnancy- improved. NST reassuring, no contractions on toco. Feeling better with reglan and IVF bolus. Discharge home with diclegis. Discussed with Dr. Dareen Piano. Patient will keep regular OB appointment.   [employer] 06/07/2017, 8:41 AM

## 2017-06-14 ENCOUNTER — Inpatient Hospital Stay (HOSPITAL_COMMUNITY)
Admission: AD | Admit: 2017-06-14 | Discharge: 2017-06-14 | Disposition: A | Discharge status: Home / Self Care | Payer: 59 | Source: Ambulatory Visit | Attending: Obstetrics and Gynecology | Admitting: Obstetrics and Gynecology

## 2017-06-14 ENCOUNTER — Encounter (HOSPITAL_COMMUNITY): Payer: Self-pay

## 2017-06-14 DIAGNOSIS — O9989 Other specified diseases and conditions complicating pregnancy, childbirth and the puerperium: Secondary | ICD-10-CM | POA: Diagnosis not present

## 2017-06-14 DIAGNOSIS — K529 Noninfective gastroenteritis and colitis, unspecified: Secondary | ICD-10-CM

## 2017-06-14 DIAGNOSIS — E162 Hypoglycemia, unspecified: Secondary | ICD-10-CM

## 2017-06-14 DIAGNOSIS — E11649 Type 2 diabetes mellitus with hypoglycemia without coma: Secondary | ICD-10-CM | POA: Diagnosis not present

## 2017-06-14 DIAGNOSIS — O219 Vomiting of pregnancy, unspecified: Secondary | ICD-10-CM | POA: Diagnosis not present

## 2017-06-14 DIAGNOSIS — R109 Unspecified abdominal pain: Secondary | ICD-10-CM | POA: Insufficient documentation

## 2017-06-14 DIAGNOSIS — O24113 Pre-existing diabetes mellitus, type 2, in pregnancy, third trimester: Secondary | ICD-10-CM | POA: Diagnosis not present

## 2017-06-14 DIAGNOSIS — O212 Late vomiting of pregnancy: Secondary | ICD-10-CM | POA: Diagnosis not present

## 2017-06-14 DIAGNOSIS — O99613 Diseases of the digestive system complicating pregnancy, third trimester: Secondary | ICD-10-CM | POA: Insufficient documentation

## 2017-06-14 DIAGNOSIS — Z3A34 34 weeks gestation of pregnancy: Secondary | ICD-10-CM | POA: Insufficient documentation

## 2017-06-14 LAB — WET PREP, GENITAL
CLUE CELLS WET PREP: NONE SEEN
SPERM: NONE SEEN
TRICH WET PREP: NONE SEEN
Yeast Wet Prep HPF POC: NONE SEEN

## 2017-06-14 LAB — URINALYSIS, ROUTINE W REFLEX MICROSCOPIC
Bilirubin Urine: NEGATIVE
GLUCOSE, UA: NEGATIVE mg/dL
HGB URINE DIPSTICK: NEGATIVE
KETONES UR: NEGATIVE mg/dL
Leukocytes, UA: NEGATIVE
Nitrite: NEGATIVE
PH: 6 (ref 5.0–8.0)
Protein, ur: 30 mg/dL — AB
RBC / HPF: NONE SEEN RBC/hpf (ref 0–5)
Specific Gravity, Urine: 1.024 (ref 1.005–1.030)

## 2017-06-14 LAB — GLUCOSE, CAPILLARY: Glucose-Capillary: 86 mg/dL (ref 65–99)

## 2017-06-14 MED ORDER — PROMETHAZINE HCL 25 MG/ML IJ SOLN
25.0000 mg | Freq: Once | INTRAVENOUS | Status: AC
  Administered 2017-06-14: 25 mg via INTRAVENOUS
  Filled 2017-06-14: qty 1

## 2017-06-14 NOTE — MAU Note (Addendum)
Patient presents with complain of abdominal pressure and pain on left side, vomiting blood and low blood sugar x 2 days.

## 2017-06-14 NOTE — Discharge Instructions (Signed)
Viral Gastroenteritis, Adult Viral gastroenteritis is also known as the stomach flu. This condition is caused by various viruses. These viruses can be passed from person to person very easily (are very contagious). This condition may affect your stomach, small intestine, and large intestine. It can cause sudden watery diarrhea, fever, and vomiting. Diarrhea and vomiting can make you feel weak and cause you to become dehydrated. You may not be able to keep fluids down. Dehydration can make you tired and thirsty, cause you to have a dry mouth, and decrease how often you urinate. Older adults and people with other diseases or a weak immune system are at higher risk for dehydration. It is important to replace the fluids that you lose from diarrhea and vomiting. If you become severely dehydrated, you may need to get fluids through an IV tube. What are the causes? Gastroenteritis is caused by various viruses, including rotavirus and norovirus. Norovirus is the most common cause in adults. You can get sick by eating food, drinking water, or touching a surface contaminated with one of these viruses. You can also get sick from sharing utensils or other personal items with an infected person. What increases the risk? This condition is more likely to develop in people:  Who have a weak defense system (immune system).  Who live with one or more children who are younger than 80 years old.  Who live in a nursing home.  Who go on cruise ships.  What are the signs or symptoms? Symptoms of this condition start suddenly 1-2 days after exposure to a virus. Symptoms may last a few days or as long as a week. The most common symptoms are watery diarrhea and vomiting. Other symptoms include:  Fever.  Headache.  Fatigue.  Pain in the abdomen.  Chills.  Weakness.  Nausea.  Muscle aches.  Loss of appetite.  How is this diagnosed? This condition is diagnosed with a medical history and physical exam. You  may also have a stool test to check for viruses or other infections. How is this treated? This condition typically goes away on its own. The focus of treatment is to restore lost fluids (rehydration). Your health care provider may recommend that you take an oral rehydration solution (ORS) to replace important salts and minerals (electrolytes) in your body. Severe cases of this condition may require giving fluids through an IV tube. Treatment may also include medicine to help with your symptoms. Follow these instructions at home: Follow instructions from your health care provider about how to care for yourself at home. Eating and drinking Follow these recommendations as told by your health care provider:  Take an ORS. This is a drink that is sold at pharmacies and retail stores.  Drink clear fluids in small amounts as you are able. Clear fluids include water, ice chips, diluted fruit juice, and low-calorie sports drinks.  Eat bland, easy-to-digest foods in small amounts as you are able. These foods include bananas, applesauce, rice, lean meats, toast, and crackers.  Avoid fluids that contain a lot of sugar or caffeine, such as energy drinks, sports drinks, and soda.  Avoid alcohol.  Avoid spicy or fatty foods.  General instructions   Drink enough fluid to keep your urine clear or pale yellow.  Wash your hands often. If soap and water are not available, use hand sanitizer.  Make sure that all people in your household wash their hands well and often.  Take over-the-counter and prescription medicines only as told by your health  care provider.  Rest at home while you recover.  Watch your condition for any changes.  Take a warm bath to relieve any burning or pain from frequent diarrhea episodes.  Keep all follow-up visits as told by your health care provider. This is important. Contact a health care provider if:  You cannot keep fluids down.  Your symptoms get worse.  You have  new symptoms.  You feel light-headed or dizzy.  You have muscle cramps. Get help right away if:  You have chest pain.  You feel extremely weak or you faint.  You see blood in your vomit.  Your vomit looks like coffee grounds.  You have bloody or black stools or stools that look like tar.  You have a severe headache, a stiff neck, or both.  You have a rash.  You have severe pain, cramping, or bloating in your abdomen.  You have trouble breathing or you are breathing very quickly.  Your heart is beating very quickly.  Your skin feels cold and clammy.  You feel confused.  You have pain when you urinate.  You have signs of dehydration, such as: ? Dark urine, very little urine, or no urine. ? Cracked lips. ? Dry mouth. ? Sunken eyes. ? Sleepiness. ? Weakness. This information is not intended to replace advice given to you by your health care provider. Make sure you discuss any questions you have with your health care provider. Document Released: 08/25/2005 Document Revised: 02/06/2016 Document Reviewed: 05/01/2015 Elsevier Interactive Patient Education  2017 [employer].   Hypoglycemia Hypoglycemia occurs when the level of sugar (glucose) in the blood is too low. Glucose is a type of sugar that provides the body's main source of energy. Certain hormones (insulin and glucagon) control the level of glucose in the blood. Insulin lowers blood glucose, and glucagon increases blood glucose. Hypoglycemia can result from having too much insulin in the bloodstream, or from not eating enough food that contains glucose. Hypoglycemia can happen in people who do or do not have diabetes. It can develop quickly, and it can be a medical emergency. What are the causes? Hypoglycemia occurs most often in people who have diabetes. If you have diabetes, hypoglycemia may be caused by:  Diabetes medicine.  Not eating enough, or not eating often enough.  Increased physical  activity.  Drinking alcohol, especially when you have not eaten recently.  If you do not have diabetes, hypoglycemia may be caused by:  A tumor in the pancreas. The pancreas is the organ that makes insulin.  Not eating enough, or not eating for long periods at a time (fasting).  Severe infection or illness that affects the liver, heart, or kidneys.  Certain medicines.  You may also have reactive hypoglycemia. This condition causes hypoglycemia within 4 hours of eating a meal. This may occur after having stomach surgery. Sometimes, the cause of reactive hypoglycemia is not known. What increases the risk? Hypoglycemia is more likely to develop in:  People who have diabetes and take medicines to lower blood glucose.  People who abuse alcohol.  People who have a severe illness.  What are the signs or symptoms? Hypoglycemia may not cause any symptoms. If you have symptoms, they may include:  Hunger.  Anxiety.  Sweating and feeling clammy.  Confusion.  Dizziness or feeling light-headed.  Sleepiness.  Nausea.  Increased heart rate.  Headache.  Blurry vision.  Seizure.  Nightmares.  Tingling or numbness around the mouth, lips, or tongue.  A change in  speech.  Decreased ability to concentrate.  A change in coordination.  Restless sleep.  Tremors or shakes.  Fainting.  Irritability.  How is this diagnosed? Hypoglycemia is diagnosed with a blood test to measure your blood glucose level. This blood test is done while you are having symptoms. Your health care provider may also do a physical exam and review your medical history. If you do not have diabetes, other tests may be done to find the cause of your hypoglycemia. How is this treated? This condition can often be treated by immediately eating or drinking something that contains glucose, such as:  3-4 sugar tablets (glucose pills).  Glucose gel, 15-gram tube.  Fruit juice, 4 oz (120 mL).  Regular  soda (not diet soda), 4 oz (120 mL).  Low-fat milk, 4 oz (120 mL).  Several pieces of hard candy.  Sugar or honey, 1 Tbsp.  Treating Hypoglycemia If You Have Diabetes  If you are alert and able to swallow safely, follow the 15:15 rule:  Take 15 grams of a rapid-acting carbohydrate. Rapid-acting options include: ? 1 tube of glucose gel. ? 3 glucose pills. ? 6-8 pieces of hard candy. ? 4 oz (120 mL) of fruit juice. ? 4 oz (120 ml) of regular (not diet) soda.  Check your blood glucose 15 minutes after you take the carbohydrate.  If the repeat blood glucose level is still at or below 70 mg/dL (3.9 mmol/L), take 15 grams of a carbohydrate again.  If your blood glucose level does not increase above 70 mg/dL (3.9 mmol/L) after 3 tries, seek emergency medical care.  After your blood glucose level returns to normal, eat a meal or a snack within 1 hour.  Treating Severe Hypoglycemia Severe hypoglycemia is when your blood glucose level is at or below 54 mg/dL (3 mmol/L). Severe hypoglycemia is an emergency. Do not wait to see if the symptoms will go away. Get medical help right away. Call your local emergency services (911 in the [place] Do not drive yourself to the hospital. If you have severe hypoglycemia and you cannot eat or drink, you may need an injection of glucagon. A family member or close friend should learn how to check your blood glucose and how to give you a glucagon injection. Ask your health care provider if you need to have an emergency glucagon injection kit available. Severe hypoglycemia may need to be treated in a hospital. The treatment may include getting glucose through an IV tube. You may also need treatment for the cause of your hypoglycemia. Follow these instructions at home: General instructions  Avoid any diets that cause you to not eat enough food. Talk with your health care provider before you start any new diet.  Take over-the-counter and prescription medicines  only as told by your health care provider.  Limit alcohol intake to no more than 1 drink per day for nonpregnant women and 2 drinks per day for men. One drink equals 12 oz of beer, 5 oz of wine, or 1 oz of hard liquor.  Keep all follow-up visits as told by your health care provider. This is important. If You Have Diabetes:   Make sure you know the symptoms of hypoglycemia.  Always have a rapid-acting carbohydrate snack with you to treat low blood sugar.  Follow your diabetes management plan, as told by your health care provider. Make sure you: ? Take your medicines as directed. ? Follow your exercise plan. ? Follow your meal plan. Eat on time, and  do not skip meals. ? Check your blood glucose as often as directed. Make sure to check your blood glucose before and after exercise. If you exercise longer or in a different way than usual, check your blood glucose more often. ? Follow your sick day plan whenever you cannot eat or drink normally. Make this plan in advance with your health care provider.  Share your diabetes management plan with people in your workplace, school, and household.  Check your urine for ketones when you are ill and as told by your health care provider.  Carry a medical alert card or wear medical alert jewelry. If You Have Reactive Hypoglycemia or Low Blood Sugar From Other Causes:  Monitor your blood glucose as told by your health care provider.  Follow instructions from your health care provider about eating or drinking restrictions. Contact a health care provider if:  You have problems keeping your blood glucose in your target range.  You have frequent episodes of hypoglycemia. Get help right away if:  You continue to have hypoglycemia symptoms after eating or drinking something containing glucose.  Your blood glucose is at or below 54 mg/dL (3 mmol/L).  You have a seizure.  You faint. These symptoms may represent a serious problem that is an  emergency. Do not wait to see if the symptoms will go away. Get medical help right away. Call your local emergency services (911 in the [place] Do not drive yourself to the hospital. This information is not intended to replace advice given to you by your health care provider. Make sure you discuss any questions you have with your health care provider. Document Released: 08/25/2005 Document Revised: 02/06/2016 Document Reviewed: 09/28/2015 Elsevier Interactive Patient Education  [employer].

## 2017-06-14 NOTE — [facility] Provider Note (Signed)
History     CSN: [id number]  Arrival date and time: 06/14/17 1825   First Provider Initiated Contact with Patient 06/14/17 1907      Chief Complaint  Patient presents with  . Emesis  . Hypoglycemia  . Abdominal Pain   HPI  [name] is a 31 y.o. [id number] at [redacted]w[redacted]d gestation presenting to [facility]U with complaints of abdominal pressure and pain on the LT side, vomiting blood and low BS x 2 days. She has been unable to keep any food or fluids down Her blood sugars have been ranging from 40's- 50's to as low as her meter not being able to read them.  Past Medical History:  Diagnosis Date  . Anemia, iron deficiency   . HTN (hypertension)    with last pregnancy  . PCOS (polycystic ovarian syndrome)   . Type II or unspecified type diabetes mellitus without mention of complication, not stated as uncontrolled    not on insulin    Past Surgical History:  Procedure Laterality Date  . LAPAROSCOPIC GASTRIC BANDING    . TONSILLECTOMY      Family History  Problem Relation Age of Onset  . Arthritis Other   . Diabetes Other   . Hypertension Other   . Hyperlipidemia Other   . Prostate cancer Other   . Cancer Maternal Aunt        breast    Social History  Substance Use Topics  . Smoking status: Never Smoker  . Smokeless tobacco: Never Used  . Alcohol use No    Allergies:  Allergies  Allergen Reactions  . Aspirin Other (See Comments)    Reaction:  Unknown   . Nsaids Other (See Comments)    Reaction:  Unknown   . Latex Rash    Prescriptions Prior to Admission  Medication Sig Dispense Refill Last Dose  . Doxylamine-Pyridoxine (DICLEGIS) 10-10 MG TBEC Take 1 tablet by mouth at bedtime. 60 tablet 0   . escitalopram (LEXAPRO) 10 MG tablet Take 10 mg by mouth at bedtime.   05/08/2017 at Unknown time  . esomeprazole (NEXIUM) 40 MG capsule Take 40 mg by mouth daily as needed (For heartburn.).   Past Month at Unknown time  . hydrOXYzine (ATARAX/VISTARIL) 25 MG tablet Take 1  tablet (25 mg total) by mouth every 6 (six) hours as needed for anxiety. Take 1 tablet as needed every 5 hours for anxiety. Take 2 tablets at bedtime as needed for sleep. 60 tablet 0 05/09/2017 at Unknown time  . promethazine (PHENERGAN) 25 MG suppository Place 1 suppository (25 mg total) rectally every 6 (six) hours as needed for nausea. (Patient not taking: Reported on 05/09/2017) 12 suppository 1 Not Taking at Unknown time  . promethazine (PHENERGAN) 25 MG tablet Take 1 tablet (25 mg total) by mouth every 6 (six) hours as needed for nausea or vomiting. 30 tablet 0 Past Month at Unknown time    Review of Systems  HENT: Negative.   Eyes: Negative.   Respiratory: Negative.   Cardiovascular: Negative.   Gastrointestinal: Positive for nausea and vomiting (since last night).  Endocrine: Negative.   Genitourinary: Negative.   Musculoskeletal: Negative.   Skin: Negative.   Allergic/Immunologic: Negative.   Neurological: Positive for weakness.  Hematological: Negative.   Psychiatric/Behavioral: Negative.    Physical Exam   Blood pressure 126/79, pulse (!) 110, temperature 98 F (36.7 C), resp. rate 17, height  (1.753 m), weight 111.1 kg (245 lb), last menstrual period 10/16/2016, SpO2 99 %.  Physical Exam  Constitutional: She is oriented to person, place, and time. She appears well-developed and well-nourished.  HENT:  Head: Normocephalic.  Eyes: Pupils are equal, round, and reactive to light.  Neck: Normal range of motion.  Cardiovascular: Normal rate, regular rhythm and normal heart sounds.   Respiratory: Effort normal and breath sounds normal.  GI: Soft. Bowel sounds are normal.  Musculoskeletal: Normal range of motion.  Neurological: She is alert and oriented to person, place, and time.  Skin: Skin is warm and dry.  Psychiatric: She has a normal mood and affect. Her behavior is normal. Judgment and thought content normal.    MAU Course  Procedures  MDM CCUA CBG  Results  for orders placed or performed during the hospital encounter of 06/14/17 (from the past 24 hour(s))  Urinalysis, Routine w reflex microscopic     Status: Abnormal   Collection Time: 06/14/17  6:30 PM  Result Value Ref Range   Color, Urine YELLOW YELLOW   APPearance HAZY (A) CLEAR   Specific Gravity, Urine 1.024 1.005 - 1.030   pH 6.0 5.0 - 8.0   Glucose, UA NEGATIVE NEGATIVE mg/dL   Hgb urine dipstick NEGATIVE NEGATIVE   Bilirubin Urine NEGATIVE NEGATIVE   Ketones, ur NEGATIVE NEGATIVE mg/dL   Protein, ur 30 (A) NEGATIVE mg/dL   Nitrite NEGATIVE NEGATIVE   Leukocytes, UA NEGATIVE NEGATIVE   RBC / HPF NONE SEEN 0 - 5 RBC/hpf   WBC, UA 0-5 0 - 5 WBC/hpf   Bacteria, UA RARE (A) NONE SEEN   Squamous Epithelial / LPF 0-5 (A) NONE SEEN   Mucus PRESENT   Glucose, capillary     Status: None   Collection Time: 06/14/17  6:53 PM  Result Value Ref Range   Glucose-Capillary 86 65 - 99 mg/dL   Report given to and care assumed by Dorathy Kinsman, CNM @ [address], MSN, CNM 06/14/2017, 7:07 PM   Assessment and Plan

## 2017-06-14 NOTE — MAU Provider Note (Signed)
History     CSN: [id number]  Arrival date and time: 06/14/17 1825   First Provider Initiated Contact with Patient 06/14/17 1907      Chief Complaint  Patient presents with  . Emesis  . Hypoglycemia  . Abdominal Pain   HPI  [name] is a 31 y.o. [id number] at [redacted]w[redacted]d gestation presenting to MAU with complaints of abdominal pressure and pain on the LT side, vomiting blood and low BS x 2 days. She has been unable to keep any food or fluids down Her blood sugars have been ranging from 40's- 50's to as low as her meter not being able to read them.  Past Medical History:  Diagnosis Date  . Anemia, iron deficiency   . HTN (hypertension)    with last pregnancy  . PCOS (polycystic ovarian syndrome)   . Type II or unspecified type diabetes mellitus without mention of complication, not stated as uncontrolled    not on insulin    Past Surgical History:  Procedure Laterality Date  . LAPAROSCOPIC GASTRIC BANDING    . TONSILLECTOMY      Family History  Problem Relation Age of Onset  . Arthritis Other   . Diabetes Other   . Hypertension Other   . Hyperlipidemia Other   . Prostate cancer Other   . Cancer Maternal Aunt        breast    Social History  Substance Use Topics  . Smoking status: Never Smoker  . Smokeless tobacco: Never Used  . Alcohol use No    Allergies:  Allergies  Allergen Reactions  . Aspirin Other (See Comments)    Reaction:  Unknown   . Nsaids Other (See Comments)    Reaction:  Unknown   . Latex Rash    Prescriptions Prior to Admission  Medication Sig Dispense Refill Last Dose  . Doxylamine-Pyridoxine (DICLEGIS) 10-10 MG TBEC Take 1 tablet by mouth at bedtime. 60 tablet 0   . escitalopram (LEXAPRO) 10 MG tablet Take 10 mg by mouth at bedtime.   05/08/2017 at Unknown time  . esomeprazole (NEXIUM) 40 MG capsule Take 40 mg by mouth daily as needed (For heartburn.).   Past Month at Unknown time  . hydrOXYzine (ATARAX/VISTARIL) 25 MG tablet Take 1  tablet (25 mg total) by mouth every 6 (six) hours as needed for anxiety. Take 1 tablet as needed every 5 hours for anxiety. Take 2 tablets at bedtime as needed for sleep. 60 tablet 0 05/09/2017 at Unknown time  . promethazine (PHENERGAN) 25 MG suppository Place 1 suppository (25 mg total) rectally every 6 (six) hours as needed for nausea. (Patient not taking: Reported on 05/09/2017) 12 suppository 1 Not Taking at Unknown time  . promethazine (PHENERGAN) 25 MG tablet Take 1 tablet (25 mg total) by mouth every 6 (six) hours as needed for nausea or vomiting. 30 tablet 0 Past Month at Unknown time    Review of Systems  Constitutional: Negative for chills and fever.  Gastrointestinal: Positive for abdominal pain, nausea and vomiting. Negative for constipation and diarrhea.  Genitourinary: Negative for dysuria, flank pain, vaginal bleeding and vaginal discharge.  Psychiatric/Behavioral: Negative for confusion.   Physical Exam   Blood pressure 126/79, pulse (!) 110, temperature 98 F (36.7 C), resp. rate 17, height  (1.753 m), weight 111.1 kg (245 lb), last menstrual period 10/16/2016, SpO2 99 %. Patient Vitals for the past 24 hrs:  BP Temp Pulse Resp SpO2 Height Weight  06/14/17 1844 126/79 98  F (36.7 C) (!) 110 17 99 % - -  06/14/17 1838 - - - - -  (1.753 m) 245 lb (111.1 kg)     Physical Exam  Nursing note and vitals reviewed. Constitutional: She is oriented to person, place, and time. She appears well-developed and well-nourished. No distress.  Cardiovascular: Normal rate.   Respiratory: Effort normal. No respiratory distress.  GI: There is no tenderness.  Genitourinary: Vagina normal. No bleeding in the vagina. No vaginal discharge found.  Neurological: She is alert and oriented to person, place, and time.  Skin: Skin is warm and dry.  Psychiatric: She has a normal mood and affect.   Closed and long per Raelyn Mora, CNM.   EFM: 130, mod variability, 15x15x accels, no  decels Toco: UI  MAU Course  Procedures  MDM CCUA CBG  Results for orders placed or performed during the hospital encounter of 06/14/17 (from the past 24 hour(s))  Urinalysis, Routine w reflex microscopic     Status: Abnormal   Collection Time: 06/14/17  6:30 PM  Result Value Ref Range   Color, Urine YELLOW YELLOW   APPearance HAZY (A) CLEAR   Specific Gravity, Urine 1.024 1.005 - 1.030   pH 6.0 5.0 - 8.0   Glucose, UA NEGATIVE NEGATIVE mg/dL   Hgb urine dipstick NEGATIVE NEGATIVE   Bilirubin Urine NEGATIVE NEGATIVE   Ketones, ur NEGATIVE NEGATIVE mg/dL   Protein, ur 30 (A) NEGATIVE mg/dL   Nitrite NEGATIVE NEGATIVE   Leukocytes, UA NEGATIVE NEGATIVE   RBC / HPF NONE SEEN 0 - 5 RBC/hpf   WBC, UA 0-5 0 - 5 WBC/hpf   Bacteria, UA RARE (A) NONE SEEN   Squamous Epithelial / LPF 0-5 (A) NONE SEEN   Mucus PRESENT   Glucose, capillary     Status: None   Collection Time: 06/14/17  6:53 PM  Result Value Ref Range   Glucose-Capillary 86 65 - 99 mg/dL  Wet prep, genital     Status: Abnormal   Collection Time: 06/14/17  7:30 PM  Result Value Ref Range   Yeast Wet Prep HPF POC NONE SEEN NONE SEEN   Trich, Wet Prep NONE SEEN NONE SEEN   Clue Cells Wet Prep HPF POC NONE SEEN NONE SEEN   WBC, Wet Prep HPF POC FEW (A) NONE SEEN   Sperm NONE SEEN    Raelyn Mora, CNM gace report to [place], CNM at 2000. D5LR bolus and phenergan ordered.   Tolerating crackers and juice and feeling much better after IV fluids and phenergan. Blood sugar per continuous glucose monitor 86.   - Hypoglycemia 2/2 Gastroenteritis. Sx controlled w/ Phenergan. Declines Zofran OTD or phenergan suppositories. Thinks she will be able to keep down phenergan tabs if she stays on a schedule after IV dose.   Assessment and Plan   1. Hypoglycemia   2. Gastroenteritis, acute   3. Nausea/vomiting in pregnancy    D/C home in stable condition. Gastroenteritis and hypoglycemia precautions.  Recommend F/U w/  Nutritionist if hypoglycemia persists and for Hx Gastric Banding--refused.  Follow-up Information    Levi Aland, MD Follow up on 06/18/2017.   Specialty:  Obstetrics and Gynecology Why:  as scheduled or sooner as needed if symptoms worsen Contact information: [address] [place] [address] [phone]        THE [facility] [place] MATERNITY ADMISSIONS Follow up.   Why:  in pregnancy emergencies Contact information: [address]  [phone] mc [facility] [address] [phone]         Allergies as of 06/14/2017      Reactions   Aspirin Other (See Comments)   Reaction:  Unknown    Nsaids Other (See Comments)   Reaction:  Unknown    Latex Rash      Medication List    TAKE these medications   Doxylamine-Pyridoxine 10-10 MG Tbec Commonly known as:  DICLEGIS Take 1 tablet by mouth at bedtime.   escitalopram 10 MG tablet Commonly known as:  LEXAPRO Take 10 mg by mouth at bedtime.   esomeprazole 40 MG capsule Commonly known as:  NEXIUM Take 40 mg by mouth daily as needed (For heartburn.).   hydrOXYzine 25 MG tablet Commonly known as:  ATARAX/VISTARIL Take 1 tablet (25 mg total) by mouth every 6 (six) hours as needed for anxiety. Take 1 tablet as needed every 5 hours for anxiety. Take 2 tablets at bedtime as needed for sleep.   promethazine 25 MG tablet Commonly known as:  PHENERGAN Take 1 tablet (25 mg total) by mouth every 6 (six) hours as needed for nausea or vomiting.   promethazine 25 MG suppository Commonly known as:  PHENERGAN Place 1 suppository (25 mg total) rectally every 6 (six) hours as needed for nausea.        Dorathy Kinsman, MSN, CNM 06/14/2017, 7:07 PM

## 2017-06-15 LAB — GC/CHLAMYDIA PROBE AMP (~~LOC~~) NOT AT ARMC
CHLAMYDIA, DNA PROBE: NEGATIVE
NEISSERIA GONORRHEA: NEGATIVE

## 2017-06-27 ENCOUNTER — Inpatient Hospital Stay (HOSPITAL_COMMUNITY)
Admission: AD | Admit: 2017-06-27 | Discharge: 2017-06-27 | Disposition: A | Discharge status: Home / Self Care | Payer: 59 | Source: Ambulatory Visit | Attending: Obstetrics and Gynecology | Admitting: Obstetrics and Gynecology

## 2017-06-27 DIAGNOSIS — O4703 False labor before 37 completed weeks of gestation, third trimester: Secondary | ICD-10-CM | POA: Diagnosis not present

## 2017-06-27 DIAGNOSIS — O368130 Decreased fetal movements, third trimester, not applicable or unspecified: Secondary | ICD-10-CM

## 2017-06-27 DIAGNOSIS — Z3A36 36 weeks gestation of pregnancy: Secondary | ICD-10-CM | POA: Insufficient documentation

## 2017-06-27 NOTE — MAU Note (Signed)
Urine in lab 

## 2017-06-27 NOTE — MAU Note (Addendum)
Having contractions for days. Called office and told to come in. Denies LOF or bleeding. 1/70% on Thursday. STates ctxs are not painful but mostly tightening. Did not feel painful ctxs with first baby who just turned a yr old

## 2017-06-27 NOTE — Discharge Instructions (Signed)

## 2017-06-27 NOTE — Progress Notes (Signed)
Pt reports decreased fetal movement today, however is felling increased movement since arrival.

## 2017-06-27 NOTE — MAU Provider Note (Signed)
History   [id number] @ 36.2 wks in with reg contractions x 3 days and decreased fetal movement. Denies vag bleeding or ROM  CSN: [id number]  Arrival date & time 06/27/17  [id number]   First Provider Initiated Contact with Patient 06/27/17 2012      Chief Complaint  Patient presents with  . Contractions    HPI  Past Medical History:  Diagnosis Date  . Anemia, iron deficiency   . HTN (hypertension)    with last pregnancy  . PCOS (polycystic ovarian syndrome)   . Type II or unspecified type diabetes mellitus without mention of complication, not stated as uncontrolled    not on insulin    Past Surgical History:  Procedure Laterality Date  . LAPAROSCOPIC GASTRIC BANDING    . TONSILLECTOMY      Family History  Problem Relation Age of Onset  . Arthritis Other   . Diabetes Other   . Hypertension Other   . Hyperlipidemia Other   . Prostate cancer Other   . Cancer Maternal Aunt        breast    Social History  Substance Use Topics  . Smoking status: Never Smoker  . Smokeless tobacco: Never Used  . Alcohol use No    OB History    Gravida Para Term Preterm AB Living   4 1 1   2 1    SAB TAB Ectopic Multiple Live Births     2   0 1      Review of Systems  Constitutional: Negative.   HENT: Negative.   Eyes: Negative.   Respiratory: Negative.   Cardiovascular: Negative.   Gastrointestinal: Positive for abdominal pain.  Endocrine: Negative.   Genitourinary: Negative.   Musculoskeletal: Negative.   Skin: Negative.   Allergic/Immunologic: Negative.   Neurological: Negative.   Hematological: Negative.   Psychiatric/Behavioral: Negative.     Allergies  Aspirin; Nsaids; and Latex  Home Medications    BP 119/77   Pulse 97   Temp 98 F (36.7 C)   Resp 18   Ht 5\' 9"  (1.753 m)   Wt 244 lb (110.7 kg)   LMP 10/16/2016   SpO2 98%   BMI 36.03 kg/m   Physical Exam  Constitutional: She is oriented to person, place, and time. She appears well-developed and  well-nourished.  HENT:  Head: Normocephalic.  Eyes: Pupils are equal, round, and reactive to light.  Neck: Normal range of motion.  Cardiovascular: Normal rate, regular rhythm, normal heart sounds and intact distal pulses.   Pulmonary/Chest: Effort normal and breath sounds normal.  Abdominal: Soft. Bowel sounds are normal.  Genitourinary: Vagina normal and uterus normal.  Musculoskeletal: Normal range of motion.  Neurological: She is alert and oriented to person, place, and time. She has normal reflexes.  Skin: Skin is warm and dry.  Psychiatric: She has a normal mood and affect. Her behavior is normal. Judgment and thought content normal.    MAU Course  Procedures (including critical care time)  Labs Reviewed - No data to display No results found.   1. Decreased fetal movements in third trimester, single or unspecified fetus   2. False labor before 37 completed weeks of gestation in third trimester       MDM  FHR pattern reactive with 15x15 accels no decels. SVE 1/60/-2 post. Pt now feeling good fetal movement. POC discussed with Dr. Henderson CloudHorvath pt to be d/c home not in labor with fetal kick counts

## 2017-07-06 ENCOUNTER — Encounter (HOSPITAL_COMMUNITY): Payer: Self-pay

## 2017-07-07 ENCOUNTER — Encounter (HOSPITAL_COMMUNITY): Payer: Self-pay | Admitting: *Deleted

## 2017-07-07 ENCOUNTER — Telehealth (HOSPITAL_COMMUNITY): Payer: Self-pay | Admitting: *Deleted

## 2017-07-07 ENCOUNTER — Other Ambulatory Visit: Payer: Self-pay | Admitting: Obstetrics and Gynecology

## 2017-07-07 ENCOUNTER — Inpatient Hospital Stay (EMERGENCY_DEPARTMENT_HOSPITAL)
Admission: AD | Admit: 2017-07-07 | Discharge: 2017-07-07 | Disposition: A | Discharge status: Home / Self Care | Payer: 59 | Source: Ambulatory Visit | Attending: Obstetrics and Gynecology | Admitting: Obstetrics and Gynecology

## 2017-07-07 ENCOUNTER — Encounter (HOSPITAL_COMMUNITY): Payer: Self-pay

## 2017-07-07 ENCOUNTER — Inpatient Hospital Stay (HOSPITAL_COMMUNITY)
Admission: AD | Admit: 2017-07-07 | Discharge: 2017-07-10 | DRG: 788 | Disposition: A | Discharge status: Home / Self Care | Payer: 59 | Source: Ambulatory Visit | Attending: Obstetrics and Gynecology | Admitting: Obstetrics and Gynecology

## 2017-07-07 DIAGNOSIS — Z91040 Latex allergy status: Secondary | ICD-10-CM

## 2017-07-07 DIAGNOSIS — O9902 Anemia complicating childbirth: Secondary | ICD-10-CM | POA: Diagnosis not present

## 2017-07-07 DIAGNOSIS — O99844 Bariatric surgery status complicating childbirth: Secondary | ICD-10-CM | POA: Diagnosis present

## 2017-07-07 DIAGNOSIS — O99824 Streptococcus B carrier state complicating childbirth: Secondary | ICD-10-CM | POA: Diagnosis present

## 2017-07-07 DIAGNOSIS — O9962 Diseases of the digestive system complicating childbirth: Secondary | ICD-10-CM | POA: Diagnosis present

## 2017-07-07 DIAGNOSIS — K219 Gastro-esophageal reflux disease without esophagitis: Secondary | ICD-10-CM | POA: Diagnosis present

## 2017-07-07 DIAGNOSIS — Z3A36 36 weeks gestation of pregnancy: Secondary | ICD-10-CM

## 2017-07-07 DIAGNOSIS — O26893 Other specified pregnancy related conditions, third trimester: Secondary | ICD-10-CM

## 2017-07-07 DIAGNOSIS — O99344 Other mental disorders complicating childbirth: Secondary | ICD-10-CM | POA: Diagnosis present

## 2017-07-07 DIAGNOSIS — Z3A37 37 weeks gestation of pregnancy: Secondary | ICD-10-CM | POA: Diagnosis not present

## 2017-07-07 DIAGNOSIS — F329 Major depressive disorder, single episode, unspecified: Secondary | ICD-10-CM | POA: Diagnosis present

## 2017-07-07 DIAGNOSIS — M545 Low back pain, unspecified: Secondary | ICD-10-CM

## 2017-07-07 DIAGNOSIS — R1013 Epigastric pain: Secondary | ICD-10-CM

## 2017-07-07 DIAGNOSIS — D509 Iron deficiency anemia, unspecified: Secondary | ICD-10-CM | POA: Diagnosis present

## 2017-07-07 DIAGNOSIS — Z3483 Encounter for supervision of other normal pregnancy, third trimester: Secondary | ICD-10-CM | POA: Diagnosis not present

## 2017-07-07 LAB — URINALYSIS, ROUTINE W REFLEX MICROSCOPIC
Bilirubin Urine: NEGATIVE
GLUCOSE, UA: NEGATIVE mg/dL
HGB URINE DIPSTICK: NEGATIVE
KETONES UR: 5 mg/dL — AB
NITRITE: NEGATIVE
PH: 5 (ref 5.0–8.0)
PROTEIN: 30 mg/dL — AB
Specific Gravity, Urine: 1.028 (ref 1.005–1.030)

## 2017-07-07 LAB — CBC
HCT: 31.8 % — ABNORMAL LOW (ref 36.0–46.0)
Hemoglobin: 10.4 g/dL — ABNORMAL LOW (ref 12.0–15.0)
MCH: 29.3 pg (ref 26.0–34.0)
MCHC: 32.7 g/dL (ref 30.0–36.0)
MCV: 89.6 fL (ref 78.0–100.0)
PLATELETS: 157 10*3/uL (ref 150–400)
RBC: 3.55 MIL/uL — ABNORMAL LOW (ref 3.87–5.11)
RDW: 15.9 % — AB (ref 11.5–15.5)
WBC: 7.6 10*3/uL (ref 4.0–10.5)

## 2017-07-07 LAB — TYPE AND SCREEN
ABO/RH(D): O POS
Antibody Screen: NEGATIVE

## 2017-07-07 MED ORDER — TERBUTALINE SULFATE 1 MG/ML IJ SOLN: 0.2500 mg | Freq: Once | INTRAMUSCULAR | Status: DC | PRN

## 2017-07-07 MED ORDER — LACTATED RINGERS IV SOLN
500.0000 mL | INTRAVENOUS | Status: DC | PRN
  Administered 2017-07-08: 500 mL via INTRAVENOUS

## 2017-07-07 MED ORDER — ACETAMINOPHEN 325 MG PO TABS
650.0000 mg | ORAL_TABLET | Freq: Once | ORAL | Status: AC
  Administered 2017-07-07: 650 mg via ORAL
  Filled 2017-07-07: qty 2

## 2017-07-07 MED ORDER — OXYTOCIN BOLUS FROM INFUSION: 500.0000 mL | Freq: Once | INTRAVENOUS | Status: DC

## 2017-07-07 MED ORDER — OXYTOCIN 40 UNITS IN LACTATED RINGERS INFUSION - SIMPLE MED
1.0000 m[IU]/min | INTRAVENOUS | Status: DC
  Administered 2017-07-08: 2 m[IU]/min via INTRAVENOUS
  Filled 2017-07-07: qty 1000

## 2017-07-07 MED ORDER — ONDANSETRON HCL 4 MG/2ML IJ SOLN
4.0000 mg | Freq: Four times a day (QID) | INTRAMUSCULAR | Status: DC | PRN
  Administered 2017-07-08: 4 mg via INTRAVENOUS

## 2017-07-07 MED ORDER — ACETAMINOPHEN 325 MG PO TABS: 650.0000 mg | ORAL_TABLET | ORAL | Status: DC | PRN

## 2017-07-07 MED ORDER — PENICILLIN G POT IN DEXTROSE 60000 UNIT/ML IV SOLN
3.0000 10*6.[IU] | INTRAVENOUS | Status: DC
  Administered 2017-07-08 (×2): 3 10*6.[IU] via INTRAVENOUS
  Filled 2017-07-07 (×6): qty 50

## 2017-07-07 MED ORDER — LACTATED RINGERS IV SOLN
INTRAVENOUS | Status: DC
  Administered 2017-07-07 – 2017-07-08 (×5): via INTRAVENOUS

## 2017-07-07 MED ORDER — FLEET ENEMA 7-19 GM/118ML RE ENEM: 1.0000 | ENEMA | RECTAL | Status: DC | PRN

## 2017-07-07 MED ORDER — PENICILLIN G POTASSIUM 5000000 UNITS IJ SOLR
5.0000 10*6.[IU] | Freq: Once | INTRAVENOUS | Status: AC
  Administered 2017-07-07: 5 10*6.[IU] via INTRAVENOUS
  Filled 2017-07-07: qty 5

## 2017-07-07 MED ORDER — GI COCKTAIL ~~LOC~~
30.0000 mL | Freq: Once | ORAL | Status: AC
  Administered 2017-07-07: 30 mL via ORAL
  Filled 2017-07-07: qty 30

## 2017-07-07 MED ORDER — OXYCODONE-ACETAMINOPHEN 5-325 MG PO TABS: 2.0000 | ORAL_TABLET | ORAL | Status: DC | PRN

## 2017-07-07 MED ORDER — BUTORPHANOL TARTRATE 1 MG/ML IJ SOLN
1.0000 mg | INTRAMUSCULAR | Status: DC | PRN
Start: 2017-07-07 — End: 2017-07-08
  Administered 2017-07-08 (×2): 1 mg via INTRAVENOUS
  Filled 2017-07-07 (×2): qty 1

## 2017-07-07 MED ORDER — LIDOCAINE HCL (PF) 1 % IJ SOLN: 30.0000 mL | INTRAMUSCULAR | Status: DC | PRN

## 2017-07-07 MED ORDER — OXYCODONE-ACETAMINOPHEN 5-325 MG PO TABS: 1.0000 | ORAL_TABLET | ORAL | Status: DC | PRN

## 2017-07-07 MED ORDER — SOD CITRATE-CITRIC ACID 500-334 MG/5ML PO SOLN
30.0000 mL | ORAL | Status: DC | PRN
  Administered 2017-07-08: 30 mL via ORAL
  Filled 2017-07-07: qty 15

## 2017-07-07 MED ORDER — OXYTOCIN 40 UNITS IN LACTATED RINGERS INFUSION - SIMPLE MED: 2.5000 [IU]/h | INTRAVENOUS | Status: DC

## 2017-07-07 NOTE — MAU Note (Signed)
"  all over" back pain that radiates to both sides of abdomen Rating pain 5/10 Constant ache  +chest pain Rating 3/10 Substernal area Describes as uncomfortable and constant in nature Started about 2 hours ago

## 2017-07-07 NOTE — MAU Note (Signed)
Pt states she had a large gush of fluids around 2115 clear fluid denies VB, constant low back  Pain.

## 2017-07-07 NOTE — Discharge Instructions (Signed)
Vaginal Delivery Vaginal delivery means that you will give birth by pushing your baby out of your birth canal (vagina). A team of health care providers will help you before, during, and after vaginal delivery. Birth experiences are unique for every woman and every pregnancy, and birth experiences vary depending on where you choose to give birth. What should I do to prepare for my baby's birth? Before your baby is born, it is important to talk with your health care provider about:  Your labor and delivery preferences. These may include: ? Medicines that you may be given. ? How you will manage your pain. This might include non-medical pain relief techniques or injectable pain relief such as epidural analgesia. ? How you and your baby will be monitored during labor and delivery. ? Who may be in the labor and delivery room with you. ? Your feelings about surgical delivery of your baby (cesarean delivery, or C-section) if this becomes necessary. ? Your feelings about receiving donated blood through an IV tube (blood transfusion) if this becomes necessary.  Whether you are able: ? To take pictures or videos of the birth. ? To eat during labor and delivery. ? To move around, walk, or change positions during labor and delivery.  What to expect after your baby is born, such as: ? Whether delayed umbilical cord clamping and cutting is offered. ? Who will care for your baby right after birth. ? Medicines or tests that may be recommended for your baby. ? Whether breastfeeding is supported in your hospital or birth center. ? How long you will be in the hospital or birth center.  How any medical conditions you have may affect your baby or your labor and delivery experience.  To prepare for your baby's birth, you should also:  Attend all of your health care visits before delivery (prenatal visits) as recommended by your health care provider. This is important.  Prepare your home for your baby's  arrival. Make sure that you have: ? Diapers. ? Baby clothing. ? Feeding equipment. ? Safe sleeping arrangements for you and your baby.  Install a car seat in your vehicle. Have your car seat checked by a certified car seat installer to make sure that it is installed safely.  Think about who will help you with your new baby at home for at least the first several weeks after delivery.  What can I expect when I arrive at the birth center or hospital? Once you are in labor and have been admitted into the hospital or birth center, your health care provider may:  Review your pregnancy history and any concerns you have.  Insert an IV tube into one of your veins. This is used to give you fluids and medicines.  Check your blood pressure, pulse, temperature, and heart rate (vital signs).  Check whether your bag of water (amniotic sac) has broken (ruptured).  Talk with you about your birth plan and discuss pain control options.  Monitoring Your health care provider may monitor your contractions (uterine monitoring) and your baby's heart rate (fetal monitoring). You may need to be monitored:  Often, but not continuously (intermittently).  All the time or for long periods at a time (continuously). Continuous monitoring may be needed if: ? You are taking certain medicines, such as medicine to relieve pain or make your contractions stronger. ? You have pregnancy or labor complications.  Monitoring may be done by:  Placing a special stethoscope or a handheld monitoring device on your abdomen to   check your baby's heartbeat, and feeling your abdomen for contractions. This method of monitoring does not continuously record your baby's heartbeat or your contractions.  Placing monitors on your abdomen (external monitors) to record your baby's heartbeat and the frequency and length of contractions. You may not have to wear external monitors all the time.  Placing monitors inside of your uterus  (internal monitors) to record your baby's heartbeat and the frequency, length, and strength of your contractions. ? Your health care provider may use internal monitors if he or she needs more information about the strength of your contractions or your baby's heart rate. ? Internal monitors are put in place by passing a thin, flexible wire through your vagina and into your uterus. Depending on the type of monitor, it may remain in your uterus or on your baby's head until birth. ? Your health care provider will discuss the benefits and risks of internal monitoring with you and will ask for your permission before inserting the monitors.  Telemetry. This is a type of continuous monitoring that can be done with external or internal monitors. Instead of having to stay in bed, you are able to move around during telemetry. Ask your health care provider if telemetry is an option for you.  Physical exam Your health care provider may perform a physical exam. This may include:  Checking whether your baby is positioned: ? With the head toward your vagina (head-down). This is most common. ? With the head toward the top of your uterus (head-up or breech). If your baby is in a breech position, your health care provider may try to turn your baby to a head-down position so you can deliver vaginally. If it does not seem that your baby can be born vaginally, your provider may recommend surgery to deliver your baby. In rare cases, you may be able to deliver vaginally if your baby is head-up (breech delivery). ? Lying sideways (transverse). Babies that are lying sideways cannot be delivered vaginally.  Checking your cervix to determine: ? Whether it is thinning out (effacing). ? Whether it is opening up (dilating). ? How low your baby has moved into your birth canal.  What are the three stages of labor and delivery?  Normal labor and delivery is divided into the following three stages: Stage 1  Stage 1 is the  longest stage of labor, and it can last for hours or days. Stage 1 includes: ? Early labor. This is when contractions may be irregular, or regular and mild. Generally, early labor contractions are more than 10 minutes apart. ? Active labor. This is when contractions get longer, more regular, more frequent, and more intense. ? The transition phase. This is when contractions happen very close together, are very intense, and may last longer than during any other part of labor.  Contractions generally feel mild, infrequent, and irregular at first. They get stronger, more frequent (about every 2-3 minutes), and more regular as you progress from early labor through active labor and transition.  Many women progress through stage 1 naturally, but you may need help to continue making progress. If this happens, your health care provider may talk with you about: ? Rupturing your amniotic sac if it has not ruptured yet. ? Giving you medicine to help make your contractions stronger and more frequent.  Stage 1 ends when your cervix is completely dilated to 4 inches (10 cm) and completely effaced. This happens at the end of the transition phase. Stage 2  Once   your cervix is completely effaced and dilated to 4 inches (10 cm), you may start to feel an urge to push. It is common for the body to naturally take a rest before feeling the urge to push, especially if you received an epidural or certain other pain medicines. This rest period may last for up to 1-2 hours, depending on your unique labor experience.  During stage 2, contractions are generally less painful, because pushing helps relieve contraction pain. Instead of contraction pain, you may feel stretching and burning pain, especially when the widest part of your baby's head passes through the vaginal opening (crowning).  Your health care provider will closely monitor your pushing progress and your baby's progress through the vagina during stage 2.  Your  health care provider may massage the area of skin between your vaginal opening and anus (perineum) or apply warm compresses to your perineum. This helps it stretch as the baby's head starts to crown, which can help prevent perineal tearing. ? In some cases, an incision may be made in your perineum (episiotomy) to allow the baby to pass through the vaginal opening. An episiotomy helps to make the opening of the vagina larger to allow more room for the baby to fit through.  It is very important to breathe and focus so your health care provider can control the delivery of your baby's head. Your health care provider may have you decrease the intensity of your pushing, to help prevent perineal tearing.  After delivery of your baby's head, the shoulders and the rest of the body generally deliver very quickly and without difficulty.  Once your baby is delivered, the umbilical cord may be cut right away, or this may be delayed for 1-2 minutes, depending on your baby's health. This may vary among health care providers, hospitals, and birth centers.  If you and your baby are healthy enough, your baby may be placed on your chest or abdomen to help maintain the baby's temperature and to help you bond with each other. Some mothers and babies start breastfeeding at this time. Your health care team will dry your baby and help keep your baby warm during this time.  Your baby may need immediate care if he or she: ? Showed signs of distress during labor. ? Has a medical condition. ? Was born too early (prematurely). ? Had a bowel movement before birth (meconium). ? Shows signs of difficulty transitioning from being inside the uterus to being outside of the uterus. If you are planning to breastfeed, your health care team will help you begin a feeding. Stage 3  The third stage of labor starts immediately after the birth of your baby and ends after you deliver the placenta. The placenta is an organ that develops  during pregnancy to provide oxygen and nutrients to your baby in the womb.  Delivering the placenta may require some pushing, and you may have mild contractions. Breastfeeding can stimulate contractions to help you deliver the placenta.  After the placenta is delivered, your uterus should tighten (contract) and become firm. This helps to stop bleeding in your uterus. To help your uterus contract and to control bleeding, your health care provider may: ? Give you medicine by injection, through an IV tube, by mouth, or through your rectum (rectally). ? Massage your abdomen or perform a vaginal exam to remove any blood clots that are left in your uterus. ? Empty your bladder by placing a thin, flexible tube (catheter) into your bladder. ? Encourage   you to breastfeed your baby. After labor is over, you and your baby will be monitored closely to ensure that you are both healthy until you are ready to go home. Your health care team will teach you how to care for yourself and your baby. This information is not intended to replace advice given to you by your health care provider. Make sure you discuss any questions you have with your health care provider. Document Released: 06/03/2008 Document Revised: 03/14/2016 Document Reviewed: 09/09/2015 [employer] Interactive Patient Education  2018 [employer]  

## 2017-07-07 NOTE — MAU Provider Note (Signed)
Chief Complaint:  Back Pain and Chest Pain   First Provider Initiated Contact with Patient 07/07/17 1855     HPI: Anna Nguyen is a 31 y.o. G4P1021 at 48w5dwho presents to maternity admissions reporting pain and pressure in epigastric area (not chest, points clearly to epigastrum, thought it was called her chest).  Also has low back pain. Denies upper back pain.  Waist down to buttocks.  Irregular mild contractions.. She reports good fetal movement, denies LOF, vaginal bleeding, vaginal itching/burning, urinary symptoms, h/a, dizziness, n/v, diarrhea, constipation or fever/chills.    Denies any shortness of breath, history or heart problems. Is on Nexium for GERD.  States took it today.  Back Pain  This is a recurrent problem. The current episode started today. The problem occurs constantly. The problem is unchanged. The pain is present in the lumbar spine. The quality of the pain is described as aching. The pain does not radiate. The pain is the same all the time. Associated symptoms include chest pain. Pertinent negatives include no dysuria, fever, headaches, leg pain, numbness, paresthesias, tingling or weakness. She has tried nothing for the symptoms.  Chest Pain   This is a new problem. The current episode started today. The onset quality is gradual. The problem occurs constantly. The problem has been unchanged. The pain is present in the epigastric region. The quality of the pain is described as pressure, tightness and burning. The pain does not radiate. Associated symptoms include back pain and palpitations. Pertinent negatives include no diaphoresis, dizziness, exertional chest pressure, fever, headaches, hemoptysis, irregular heartbeat, leg pain, nausea, numbness, shortness of breath, sputum production, syncope, vomiting or weakness. The pain is aggravated by nothing. She has tried nothing for the symptoms. Risk factors include obesity.    RN Note: "all over" back pain that radiates to  both sides of abdomen Rating pain 5/10, Constant ache +chest pain, Rating 3/10, Substernal area Describes as uncomfortable and constant in nature Started about 2 hours ago  Past Medical History: Past Medical History:  Diagnosis Date  . Anemia, iron deficiency   . HTN (hypertension)    with last pregnancy  . PCOS (polycystic ovarian syndrome)   . Type II or unspecified type diabetes mellitus without mention of complication, not stated as uncontrolled    not on insulin    Past obstetric history: OB History  Gravida Para Term Preterm AB Living  4 1 1   2 1   SAB TAB Ectopic Multiple Live Births    2   0 1    # Outcome Date GA Lbr Len/2nd Weight Sex Delivery Anes PTL Lv  4 Current           3 Term 06/11/16 [redacted]w[redacted]d 15:02 / 00:27 7 lb 13.4 oz (3.555 kg) F Vag-Spont EPI  LIV  2 TAB           1 TAB               Past Surgical History: Past Surgical History:  Procedure Laterality Date  . LAPAROSCOPIC GASTRIC BANDING    . TONSILLECTOMY    . WISDOM TOOTH EXTRACTION      Family History: Family History  Problem Relation Age of Onset  . Arthritis Other   . Cancer Maternal Aunt        breast  . Diabetes Maternal Uncle   . Hyperlipidemia Maternal Uncle   . Hypertension Maternal Uncle   . Asthma Maternal Grandmother   . Diabetes Maternal Grandmother   .  Hyperlipidemia Maternal Grandmother   . Hypertension Maternal Grandmother   . Asthma Maternal Grandfather   . Diabetes Maternal Grandfather   . Hyperlipidemia Maternal Grandfather   . Hypertension Maternal Grandfather   . Prostate cancer Paternal Grandfather     Social History: Social History  Substance Use Topics  . Smoking status: Never Smoker  . Smokeless tobacco: Never Used  . Alcohol use No    Allergies:  Allergies  Allergen Reactions  . Aspirin Other (See Comments)    Reaction:  Unknown   . Nsaids Other (See Comments)    Reaction:  Unknown   . Latex Rash    Meds:  Prescriptions Prior to Admission   Medication Sig Dispense Refill Last Dose  . Doxylamine-Pyridoxine (DICLEGIS) 10-10 MG TBEC Take 1 tablet by mouth at bedtime. 60 tablet 0 More than a month at Unknown time  . escitalopram (LEXAPRO) 10 MG tablet Take 10 mg by mouth at bedtime.   06/26/2017 at Unknown time  . esomeprazole (NEXIUM) 40 MG capsule Take 40 mg by mouth daily as needed (For heartburn.).   Past Week at Unknown time  . hydrOXYzine (ATARAX/VISTARIL) 25 MG tablet Take 1 tablet (25 mg total) by mouth every 6 (six) hours as needed for anxiety. Take 1 tablet as needed every 5 hours for anxiety. Take 2 tablets at bedtime as needed for sleep. 60 tablet 0 06/26/2017 at Unknown time  . promethazine (PHENERGAN) 25 MG suppository Place 1 suppository (25 mg total) rectally every 6 (six) hours as needed for nausea. 12 suppository 1 06/26/2017 at Unknown time  . promethazine (PHENERGAN) 25 MG tablet Take 1 tablet (25 mg total) by mouth every 6 (six) hours as needed for nausea or vomiting. 30 tablet 0 Past Month at Unknown time    I have reviewed patient's Past Medical Hx, Surgical Hx, Family Hx, Social Hx, medications and allergies.   ROS:  Review of Systems  Constitutional: Negative for diaphoresis and fever.  Respiratory: Negative for hemoptysis, sputum production and shortness of breath.   Cardiovascular: Positive for chest pain and palpitations. Negative for syncope.  Gastrointestinal: Negative for nausea and vomiting.  Genitourinary: Negative for dysuria.  Musculoskeletal: Positive for back pain.  Neurological: Negative for dizziness, tingling, weakness, numbness, headaches and paresthesias.   Other systems negative  Physical Exam  Patient Vitals for the past 24 hrs:  BP Temp Temp src Pulse Resp SpO2 Weight  07/07/17 1838 125/71 97.8 F (36.6 C) Oral 85 19 100 % 247 lb 0.6 oz (112.1 kg)   Constitutional: Well-developed, well-nourished female in no acute distress.  Cardiovascular: normal rate and rhythm Respiratory:  normal effort, clear to auscultation bilaterally GI: Abd soft, non-tender, gravid appropriate for gestational age.   No rebound or guarding. MS: Extremities nontender, no edema, normal ROM Neurologic: Alert and oriented x 4.  GU: Neg CVAT.  PELVIC EXAM:  Cervix 1/50/-3/vertex   FHT:  Baseline 140 , moderate variability, accelerations present, no decelerations Contractions:  Irregular     Labs: O/Positive/-- (03/28 0000) Results for orders placed or performed during the hospital encounter of 07/07/17 (from the past 24 hour(s))  Urinalysis, Routine w reflex microscopic     Status: Abnormal   Collection Time: 07/07/17  6:32 PM  Result Value Ref Range   Color, Urine YELLOW YELLOW   APPearance HAZY (A) CLEAR   Specific Gravity, Urine 1.028 1.005 - 1.030   pH 5.0 5.0 - 8.0   Glucose, UA NEGATIVE NEGATIVE mg/dL   Hgb urine  dipstick NEGATIVE NEGATIVE   Bilirubin Urine NEGATIVE NEGATIVE   Ketones, ur 5 (A) NEGATIVE mg/dL   Protein, ur 30 (A) NEGATIVE mg/dL   Nitrite NEGATIVE NEGATIVE   Leukocytes, UA SMALL (A) NEGATIVE   RBC / HPF 0-5 0 - 5 RBC/hpf   WBC, UA 6-30 0 - 5 WBC/hpf   Bacteria, UA RARE (A) NONE SEEN   Squamous Epithelial / LPF 6-30 (A) NONE SEEN   Mucus PRESENT     Imaging:  No results found.  MAU Course/MDM: I have ordered labs and reviewed results. Urine slightly abnormal   Will send to culture NST reviewed and found reactive Consult Dr Claiborne Billingsallahan with presentation, exam findings and test results.  Treatments in MAU included GI cocktail which helped epigastric pain.  Tylenol given for back pain. DIscussed signs of labor and what to return for.  UTI precautions. .    Assessment: SIUP at 9213w5d Epigastric pain, likely GERD Low back pain  Plan: Discharge home Labor precautions and fetal kick counts Follow up in Office for prenatal visits and recheck of status  Encouraged to return here or to other Urgent Care/ED if she develops worsening of symptoms, increase in  pain, fever, or other concerning symptoms.    Pt stable at time of discharge.  Wynelle BourgeoisMarie Tamika Shropshire CNM, MSN Certified Nurse-Midwife 07/07/2017 6:55 PM

## 2017-07-07 NOTE — Telephone Encounter (Signed)
Preadmission screen  

## 2017-07-08 ENCOUNTER — Encounter (HOSPITAL_COMMUNITY)
Admission: AD | Disposition: A | Discharge status: Home / Self Care | Payer: Self-pay | Source: Ambulatory Visit | Attending: Obstetrics and Gynecology

## 2017-07-08 ENCOUNTER — Inpatient Hospital Stay (HOSPITAL_COMMUNITY): Payer: 59 | Admitting: Anesthesiology

## 2017-07-08 ENCOUNTER — Encounter (HOSPITAL_COMMUNITY): Payer: Self-pay

## 2017-07-08 LAB — GLUCOSE, CAPILLARY
GLUCOSE-CAPILLARY: 77 mg/dL (ref 65–99)
GLUCOSE-CAPILLARY: 82 mg/dL (ref 65–99)
Glucose-Capillary: 136 mg/dL — ABNORMAL HIGH (ref 65–99)

## 2017-07-08 LAB — RPR: RPR: NONREACTIVE

## 2017-07-08 SURGERY — Surgical Case
Anesthesia: Regional | Site: Abdomen | Wound class: Clean Contaminated

## 2017-07-08 MED ORDER — DIPHENHYDRAMINE HCL 50 MG/ML IJ SOLN: 12.5000 mg | INTRAMUSCULAR | Status: DC | PRN

## 2017-07-08 MED ORDER — METHYLERGONOVINE MALEATE 0.2 MG PO TABS: 0.2000 mg | ORAL_TABLET | ORAL | Status: DC | PRN

## 2017-07-08 MED ORDER — CEFAZOLIN SODIUM-DEXTROSE 2-3 GM-%(50ML) IV SOLR
INTRAVENOUS | Status: DC | PRN
  Administered 2017-07-08: 2 g via INTRAVENOUS

## 2017-07-08 MED ORDER — SODIUM BICARBONATE 8.4 % IV SOLN
INTRAVENOUS | Status: AC
  Filled 2017-07-08: qty 50

## 2017-07-08 MED ORDER — FENTANYL CITRATE (PF) 100 MCG/2ML IJ SOLN
INTRAMUSCULAR | Status: AC
  Filled 2017-07-08: qty 2

## 2017-07-08 MED ORDER — SENNOSIDES-DOCUSATE SODIUM 8.6-50 MG PO TABS
2.0000 | ORAL_TABLET | ORAL | Status: DC
  Administered 2017-07-08 – 2017-07-09 (×2): 2 via ORAL
  Filled 2017-07-08 (×2): qty 2

## 2017-07-08 MED ORDER — EPHEDRINE 5 MG/ML INJ: 10.0000 mg | INTRAVENOUS | Status: DC | PRN

## 2017-07-08 MED ORDER — MEPERIDINE HCL 25 MG/ML IJ SOLN: 6.2500 mg | INTRAMUSCULAR | Status: DC | PRN

## 2017-07-08 MED ORDER — OXYTOCIN 10 UNIT/ML IJ SOLN
INTRAMUSCULAR | Status: AC
  Filled 2017-07-08: qty 4

## 2017-07-08 MED ORDER — MORPHINE SULFATE (PF) 0.5 MG/ML IJ SOLN
INTRAMUSCULAR | Status: AC
  Filled 2017-07-08: qty 10

## 2017-07-08 MED ORDER — LACTATED RINGERS IV SOLN
500.0000 mL | Freq: Once | INTRAVENOUS | Status: AC
  Administered 2017-07-08: 500 mL via INTRAVENOUS

## 2017-07-08 MED ORDER — HYDROXYZINE HCL 25 MG PO TABS
25.0000 mg | ORAL_TABLET | Freq: Four times a day (QID) | ORAL | Status: DC | PRN
  Filled 2017-07-08: qty 1

## 2017-07-08 MED ORDER — PROMETHAZINE HCL 25 MG/ML IJ SOLN: 6.2500 mg | INTRAMUSCULAR | Status: DC | PRN

## 2017-07-08 MED ORDER — PHENYLEPHRINE 40 MCG/ML (10ML) SYRINGE FOR IV PUSH (FOR BLOOD PRESSURE SUPPORT): 80.0000 ug | PREFILLED_SYRINGE | INTRAVENOUS | Status: DC | PRN

## 2017-07-08 MED ORDER — LACTATED RINGERS IV SOLN
INTRAVENOUS | Status: DC
  Administered 2017-07-08: 11:00:00 via INTRAUTERINE

## 2017-07-08 MED ORDER — HYDROMORPHONE HCL 1 MG/ML IJ SOLN
1.0000 mg | INTRAMUSCULAR | Status: AC | PRN
  Administered 2017-07-08 (×3): 1 mg via INTRAVENOUS
  Filled 2017-07-08 (×3): qty 1

## 2017-07-08 MED ORDER — PANTOPRAZOLE SODIUM 40 MG PO TBEC
40.0000 mg | DELAYED_RELEASE_TABLET | Freq: Every day | ORAL | Status: DC
  Administered 2017-07-08 – 2017-07-10 (×2): 40 mg via ORAL
  Filled 2017-07-08 (×2): qty 1

## 2017-07-08 MED ORDER — SIMETHICONE 80 MG PO CHEW
80.0000 mg | CHEWABLE_TABLET | ORAL | Status: DC
  Administered 2017-07-08 – 2017-07-09 (×2): 80 mg via ORAL
  Filled 2017-07-08 (×2): qty 1

## 2017-07-08 MED ORDER — TETANUS-DIPHTH-ACELL PERTUSSIS 5-2.5-18.5 LF-MCG/0.5 IM SUSP: 0.5000 mL | Freq: Once | INTRAMUSCULAR | Status: DC

## 2017-07-08 MED ORDER — FLEET ENEMA 7-19 GM/118ML RE ENEM: 1.0000 | ENEMA | Freq: Every day | RECTAL | Status: DC | PRN

## 2017-07-08 MED ORDER — FERROUS SULFATE 325 (65 FE) MG PO TABS
325.0000 mg | ORAL_TABLET | Freq: Two times a day (BID) | ORAL | Status: DC
  Administered 2017-07-08 – 2017-07-10 (×4): 325 mg via ORAL
  Filled 2017-07-08 (×4): qty 1

## 2017-07-08 MED ORDER — OXYCODONE HCL 5 MG PO TABS
10.0000 mg | ORAL_TABLET | ORAL | Status: DC | PRN
  Administered 2017-07-09 – 2017-07-10 (×8): 10 mg via ORAL
  Filled 2017-07-08 (×8): qty 2

## 2017-07-08 MED ORDER — ZOLPIDEM TARTRATE 5 MG PO TABS: 5.0000 mg | ORAL_TABLET | Freq: Every evening | ORAL | Status: DC | PRN

## 2017-07-08 MED ORDER — SIMETHICONE 80 MG PO CHEW
80.0000 mg | CHEWABLE_TABLET | Freq: Three times a day (TID) | ORAL | Status: DC
  Administered 2017-07-08 – 2017-07-10 (×5): 80 mg via ORAL
  Filled 2017-07-08 (×5): qty 1

## 2017-07-08 MED ORDER — FENTANYL 2.5 MCG/ML BUPIVACAINE 1/10 % EPIDURAL INFUSION (WH - ANES)
14.0000 mL/h | INTRAMUSCULAR | Status: DC | PRN
Start: 2017-07-08 — End: 2017-07-08
  Administered 2017-07-08 (×2): 14 mL/h via EPIDURAL
  Filled 2017-07-08: qty 100

## 2017-07-08 MED ORDER — FENTANYL 2.5 MCG/ML BUPIVACAINE 1/10 % EPIDURAL INFUSION (WH - ANES)
INTRAMUSCULAR | Status: AC
  Filled 2017-07-08: qty 100

## 2017-07-08 MED ORDER — MEASLES, MUMPS & RUBELLA VAC ~~LOC~~ INJ
0.5000 mL | INJECTION | Freq: Once | SUBCUTANEOUS | Status: DC
  Filled 2017-07-08: qty 0.5

## 2017-07-08 MED ORDER — OXYTOCIN 10 UNIT/ML IJ SOLN
INTRAVENOUS | Status: DC | PRN
  Administered 2017-07-08: 40 [IU] via INTRAVENOUS

## 2017-07-08 MED ORDER — OXYTOCIN 40 UNITS IN LACTATED RINGERS INFUSION - SIMPLE MED: 2.5000 [IU]/h | INTRAVENOUS | Status: AC

## 2017-07-08 MED ORDER — MORPHINE SULFATE (PF) 4 MG/ML IV SOLN: 1.0000 mg | INTRAVENOUS | Status: DC | PRN

## 2017-07-08 MED ORDER — PRENATAL MULTIVITAMIN CH
1.0000 | ORAL_TABLET | Freq: Every day | ORAL | Status: DC
  Administered 2017-07-09 – 2017-07-10 (×2): 1 via ORAL
  Filled 2017-07-08: qty 1

## 2017-07-08 MED ORDER — MENTHOL 3 MG MT LOZG: 1.0000 | LOZENGE | OROMUCOSAL | Status: DC | PRN

## 2017-07-08 MED ORDER — MIDAZOLAM HCL 2 MG/2ML IJ SOLN: 0.5000 mg | Freq: Once | INTRAMUSCULAR | Status: DC | PRN

## 2017-07-08 MED ORDER — LACTATED RINGERS IV SOLN
INTRAVENOUS | Status: DC | PRN
  Administered 2017-07-08: 13:00:00 via INTRAVENOUS

## 2017-07-08 MED ORDER — LIDOCAINE HCL (PF) 1 % IJ SOLN
INTRAMUSCULAR | Status: DC | PRN
  Administered 2017-07-08 (×2): 5 mL via EPIDURAL

## 2017-07-08 MED ORDER — DIBUCAINE 1 % RE OINT: 1.0000 "application " | TOPICAL_OINTMENT | RECTAL | Status: DC | PRN

## 2017-07-08 MED ORDER — ONDANSETRON HCL 4 MG/2ML IJ SOLN
INTRAMUSCULAR | Status: AC
Start: 2017-07-08 — End: ?
  Filled 2017-07-08: qty 2

## 2017-07-08 MED ORDER — ACETAMINOPHEN 325 MG PO TABS
650.0000 mg | ORAL_TABLET | ORAL | Status: DC | PRN
  Administered 2017-07-09 – 2017-07-10 (×2): 650 mg via ORAL
  Filled 2017-07-08 (×2): qty 2

## 2017-07-08 MED ORDER — CEFAZOLIN SODIUM-DEXTROSE 2-3 GM-%(50ML) IV SOLR
INTRAVENOUS | Status: AC
  Filled 2017-07-08: qty 50

## 2017-07-08 MED ORDER — SODIUM BICARBONATE 8.4 % IV SOLN
INTRAVENOUS | Status: DC | PRN
  Administered 2017-07-08: 5 mL via EPIDURAL
  Administered 2017-07-08: 10 mL via EPIDURAL
  Administered 2017-07-08: 2 mL via EPIDURAL

## 2017-07-08 MED ORDER — SIMETHICONE 80 MG PO CHEW: 80.0000 mg | CHEWABLE_TABLET | ORAL | Status: DC | PRN

## 2017-07-08 MED ORDER — WITCH HAZEL-GLYCERIN EX PADS: 1.0000 "application " | MEDICATED_PAD | CUTANEOUS | Status: DC | PRN

## 2017-07-08 MED ORDER — SODIUM CHLORIDE 0.9 % IR SOLN
Status: DC | PRN
  Administered 2017-07-08: 1

## 2017-07-08 MED ORDER — COCONUT OIL OIL: 1.0000 "application " | TOPICAL_OIL | Status: DC | PRN

## 2017-07-08 MED ORDER — PHENYLEPHRINE HCL 10 MG/ML IJ SOLN
INTRAMUSCULAR | Status: DC | PRN
  Administered 2017-07-08 (×3): 80 ug via INTRAVENOUS

## 2017-07-08 MED ORDER — MORPHINE SULFATE (PF) 0.5 MG/ML IJ SOLN
INTRAMUSCULAR | Status: DC | PRN
  Administered 2017-07-08: 4 mg via EPIDURAL
  Administered 2017-07-08: 1 mg via INTRAVENOUS

## 2017-07-08 MED ORDER — METHYLERGONOVINE MALEATE 0.2 MG/ML IJ SOLN: 0.2000 mg | INTRAMUSCULAR | Status: DC | PRN

## 2017-07-08 MED ORDER — DIPHENHYDRAMINE HCL 25 MG PO CAPS
25.0000 mg | ORAL_CAPSULE | Freq: Four times a day (QID) | ORAL | Status: DC | PRN
  Administered 2017-07-09: 25 mg via ORAL
  Filled 2017-07-08: qty 1

## 2017-07-08 MED ORDER — LACTATED RINGERS IV SOLN
INTRAVENOUS | Status: DC
  Administered 2017-07-08 – 2017-07-09 (×2): via INTRAVENOUS

## 2017-07-08 MED ORDER — FENTANYL CITRATE (PF) 100 MCG/2ML IJ SOLN
50.0000 ug | INTRAMUSCULAR | Status: DC | PRN
  Administered 2017-07-08 (×2): 50 ug via INTRAVENOUS

## 2017-07-08 MED ORDER — BISACODYL 10 MG RE SUPP: 10.0000 mg | Freq: Every day | RECTAL | Status: DC | PRN

## 2017-07-08 MED ORDER — IBUPROFEN 800 MG PO TABS
800.0000 mg | ORAL_TABLET | Freq: Three times a day (TID) | ORAL | Status: DC
  Filled 2017-07-08 (×2): qty 1

## 2017-07-08 MED ORDER — OXYCODONE HCL 5 MG PO TABS
5.0000 mg | ORAL_TABLET | ORAL | Status: DC | PRN
  Administered 2017-07-10: 5 mg via ORAL
  Filled 2017-07-08: qty 1

## 2017-07-08 MED ORDER — LIDOCAINE-EPINEPHRINE (PF) 2 %-1:200000 IJ SOLN
INTRAMUSCULAR | Status: AC
  Filled 2017-07-08: qty 20

## 2017-07-08 MED ORDER — PHENYLEPHRINE 40 MCG/ML (10ML) SYRINGE FOR IV PUSH (FOR BLOOD PRESSURE SUPPORT)
PREFILLED_SYRINGE | INTRAVENOUS | Status: AC
  Filled 2017-07-08: qty 20

## 2017-07-08 MED ORDER — SCOPOLAMINE 1 MG/3DAYS TD PT72
MEDICATED_PATCH | TRANSDERMAL | Status: AC
  Filled 2017-07-08: qty 1

## 2017-07-08 MED ORDER — ESCITALOPRAM OXALATE 10 MG PO TABS
10.0000 mg | ORAL_TABLET | Freq: Every day | ORAL | Status: DC
  Administered 2017-07-08 – 2017-07-09 (×2): 10 mg via ORAL
  Filled 2017-07-08 (×3): qty 1

## 2017-07-08 SURGICAL SUPPLY — 33 items
BENZOIN TINCTURE PRP APPL 2/3 (GAUZE/BANDAGES/DRESSINGS) ×2 IMPLANT
CHLORAPREP W/TINT 26ML (MISCELLANEOUS) ×2 IMPLANT
CLAMP CORD UMBIL (MISCELLANEOUS) IMPLANT
CLOSURE STERI STRIP 1/2 X4 (GAUZE/BANDAGES/DRESSINGS) ×2 IMPLANT
CLOTH BEACON ORANGE TIMEOUT ST (SAFETY) ×2 IMPLANT
DRSG OPSITE POSTOP 4X10 (GAUZE/BANDAGES/DRESSINGS) ×2 IMPLANT
ELECT REM PT RETURN 9FT ADLT (ELECTROSURGICAL) ×2
ELECTRODE REM PT RTRN 9FT ADLT (ELECTROSURGICAL) ×1 IMPLANT
EXTRACTOR VACUUM BELL STYLE (SUCTIONS) IMPLANT
GAUZE SPONGE 4X4 12PLY STRL LF (GAUZE/BANDAGES/DRESSINGS) ×4 IMPLANT
GLOVE BIO SURGEON STRL SZ7 (GLOVE) ×2 IMPLANT
GLOVE BIOGEL PI IND STRL 7.0 (GLOVE) ×1 IMPLANT
GLOVE BIOGEL PI INDICATOR 7.0 (GLOVE) ×1
GOWN STRL REUS W/TWL LRG LVL3 (GOWN DISPOSABLE) ×4 IMPLANT
KIT ABG SYR 3ML LUER SLIP (SYRINGE) ×2 IMPLANT
NEEDLE HYPO 25X5/8 SAFETYGLIDE (NEEDLE) ×2 IMPLANT
NS IRRIG 1000ML POUR BTL (IV SOLUTION) ×2 IMPLANT
PACK C SECTION WH (CUSTOM PROCEDURE TRAY) ×2 IMPLANT
PAD ABD 7.5X8 STRL (GAUZE/BANDAGES/DRESSINGS) ×2 IMPLANT
PAD OB MATERNITY 4.3X12.25 (PERSONAL CARE ITEMS) ×2 IMPLANT
PENCIL SMOKE EVAC W/HOLSTER (ELECTROSURGICAL) ×2 IMPLANT
RTRCTR C-SECT PINK 25CM LRG (MISCELLANEOUS) ×2 IMPLANT
STRIP CLOSURE SKIN 1/2X4 (GAUZE/BANDAGES/DRESSINGS) ×2 IMPLANT
SUT MNCRL 0 VIOLET CTX 36 (SUTURE) ×2 IMPLANT
SUT MONOCRYL 0 CTX 36 (SUTURE) ×2
SUT PLAIN 2 0 XLH (SUTURE) ×2 IMPLANT
SUT VIC AB 0 CT1 27 (SUTURE) ×2
SUT VIC AB 0 CT1 27XBRD ANBCTR (SUTURE) ×2 IMPLANT
SUT VIC AB 2-0 CT1 27 (SUTURE) ×1
SUT VIC AB 2-0 CT1 TAPERPNT 27 (SUTURE) ×1 IMPLANT
SUT VIC AB 4-0 KS 27 (SUTURE) ×2 IMPLANT
TOWEL OR 17X24 6PK STRL BLUE (TOWEL DISPOSABLE) ×2 IMPLANT
TRAY FOLEY BAG SILVER LF 14FR (SET/KITS/TRAYS/PACK) IMPLANT

## 2017-07-08 NOTE — Brief Op Note (Signed)
07/07/2017 - 07/08/2017  1:58 PM  PATIENT:  Anna Nguyen  31 y.o. female  PRE-OPERATIVE DIAGNOSIS:  nonreassuring fetal heart rate remote from vaginal delivery.  POST-OPERATIVE DIAGNOSIS:  same  PROCEDURE:  Procedure(s): CESAREAN SECTION (N/A)  SURGEON:  Surgeon(s) and Role:    * Karenna Romanoff, MD - Primary  ASSISTANTS: none   ANESTHESIA:   epidural  EBL:  1485 mL   BLOOD ADMINISTERED:none  SPECIMEN:  Source of Specimen:  placenta  DISPOSITION OF SPECIMEN:  PATHOLOGY  COUNTS:  YES  TOURNIQUET:  * No tourniquets in log *  DICTATION: .Note written in EPIC  PLAN OF CARE: Admit to inpatient   PATIENT DISPOSITION:  PACU - hemodynamically stable.   Delay start of Pharmacological VTE agent (>24hrs) due to surgical blood loss or risk of bleeding: not applicable  Complications:  none Medications:  Ancef, Pitocin Findings:  Baby female, Apgars 8,9, weight P.   Normal tubes, ovaries and uterus seen.  Baby was skin to skin with mother after birth in the OR.  Technique:  After adequate epidural anesthesia was achieved, the patient was prepped and draped in usual sterile fashion with betadine since the heart rate remained down for about 9-10 minutes.  A foley catheter was already draining the bladder.  A pfannanstiel incision was made with the scalpel and carried down to the fascia with the bovie cautery. The fascia was incised in the midline with the scalpel and carried in a transverse curvilinear manner bilaterally.  The fascia was reflected superiorly and inferiorly off the rectus muscles and the muscles split in the midline.  A bowel free portion of the peritoneum was entered bluntly and then extended in a superior and inferior manner with good visualization of the bowel and bladder.  The Alexis instrument was then placed and the vesico-uterine fascia tented up and incised in a transverse curvilinear manner.  A 2 cm transverse incision was made in the upper portion of the  lower uterine segment until the amnion was exposed.   The incision was extended transversely in a blunt manner.  Light mec fluid was noted and the baby delivered in the vertex OP presentation without complication.  The baby was bulb suctioned and the cord was clamped and cut.  The baby was then handed to awaiting Neonatology.  The placenta was then delivered manually and the uterus cleared of all debris.  The uterine incision was then closed with a running lock stitch of 0 monocryl.  An imbricating layer of 0 monocryl was closed as well. Excellent hemostasis of the uterine incision was achieved and the abdomen was cleared with irrigation.  The peritoneum was closed with a running stitch of 2-0 vicryl.  This incorporated the rectus muscles as a separate layer.  The fascia was then closed with a running stitch of 0 vicryl.  The subcutaneous layer was closed with interrupted  stitches of 2-0 plain gut.  The skin was closed with 4-0 vicryl on a Keith needle and steri-strips.  The patient tolerated the procedure well and was returned to the recovery room in stable condition.  All counts were correct times three.  Ashlynd Michna A   

## 2017-07-08 NOTE — Progress Notes (Signed)
FHTS 120s,  GSTV, NST R but mild to moderate variables with occ severe variable. SVE 7/80/-1  Amnio infusion started, O2 placed, pitocin stopped for now.  Pt has been consented in case of need of C/S.  Currently FHTs do not warrant moving to C/S but will continue to watch.

## 2017-07-08 NOTE — Progress Notes (Signed)
Now having repetitive variable decels- for cesarean section.

## 2017-07-08 NOTE — Lactation Note (Signed)
This note was copied from a baby's chart. Lactation Consultation Note  Patient Name: Boy Elzie RingsCharisse Wurtz Today's Date: 07/08/2017 Reason for consult: Initial assessment;Early term 37-38.6wks;Maternal endocrine disorder Type of Endocrine Disorder?: PCOS GDM  Visited with P2 Mom after emergency C/S for NRFHR.  Mom with HTN, GDM, PCOS, Gastric bypass surgery, severe emesis with pregnancy, anemia, and B-12 deficiency. Mom isn't feeling well, having surgical pain, and nausea.  Baby 8 hrs old.  Baby breastfed twice, and has had 1 bottle feeding of 15 ml.  Mom desires to try to breastfeed, but primarily pumped and bottle fed her 1st child (who is 2412 months old).  Recommended she double pump along with breast massage and hand expression if baby doesn't latch to the breast for a feeding.  Mom too sick for pump to be set up at present, as she is trying to rest.  Encouraged her to call for her nurse to set pump up when she is feeling better.    Recommended keeping baby STS as much as possible, and feeding on cue.  Goal of 8-12 feedings per 24 hrs.  Instructed to double pump if baby unable to attain a deep latch.  Lactation Brochure left with Mom.  Informed her of IP and OP lactation support services available.  Mom to ask for assistance as needed.   Lactation to follow up in am.     Consult Status Consult Status: Follow-up Date: 07/09/17 Follow-up type: In-patient    Judee ClaraSmith, Mara Favero E 07/08/2017, 10:01 PM

## 2017-07-08 NOTE — Anesthesia Preprocedure Evaluation (Signed)
Anesthesia Evaluation  Patient identified by MRN, date of birth, ID band Patient awake    Reviewed: Allergy & Precautions, H&P , NPO status , Patient's Chart, lab work & pertinent test results, reviewed documented beta blocker date and time   Airway Mallampati: III  TM Distance: >3 FB Neck ROM: full    Dental no notable dental hx.    Pulmonary neg pulmonary ROS,    Pulmonary exam normal breath sounds clear to auscultation       Cardiovascular hypertension, negative cardio ROS Normal cardiovascular exam Rhythm:regular Rate:Normal     Neuro/Psych negative neurological ROS  negative psych ROS   GI/Hepatic negative GI ROS, Neg liver ROS,   Endo/Other  negative endocrine ROSdiabetes  Renal/GU negative Renal ROS  negative genitourinary   Musculoskeletal   Abdominal   Peds  Hematology negative hematology ROS (+)   Anesthesia Other Findings   Reproductive/Obstetrics (+) Pregnancy                             Anesthesia Physical Anesthesia Plan  ASA: III  Anesthesia Plan: Epidural   Post-op Pain Management:    Induction:   PONV Risk Score and Plan:   Airway Management Planned:   Additional Equipment:   Intra-op Plan:   Post-operative Plan:   Informed Consent: I have reviewed the patients History and Physical, chart, labs and discussed the procedure including the risks, benefits and alternatives for the proposed anesthesia with the patient or authorized representative who has indicated his/her understanding and acceptance.     Plan Discussed with:   Anesthesia Plan Comments:         Anesthesia Quick Evaluation  

## 2017-07-08 NOTE — Progress Notes (Signed)
Pt comfortable with epidural.  FHTs 115s, with gSTV, NST R and occ very mild variables. Toco q 1-2  SVE 6/80/-2  Continue induction with pitocin.

## 2017-07-08 NOTE — Plan of Care (Signed)
Problem: Role Relationship: Goal: Ability to demonstrate positive interaction with newborn will improve Outcome: Completed/Met Date Met: 07/08/17 Patient is bonding well with newborn.

## 2017-07-08 NOTE — Anesthesia Pain Management Evaluation Note (Signed)
  CRNA Pain Management Visit Note  [facility] 31 [facility]  "Hello I am a [facility] of the [facility]. We have an anesthesia team available at all times to provide care throughout the hospital, including epidural management and anesthesia for C-section. I don't know your plan for the delivery whether it a natural birth, water birth, IV sedation, nitrous supplementation, doula or epidural, but we want to meet your pain goals."   1.Was your pain managed to your expectations on prior hospitalizations?   Yes   2.What is your expectation for pain management during this hospitalization?     Epidural  3.How can we help you reach that goal?   Record the patient's initial score and the patient's pain goal.   Pain: 6  Pain Goal: 10 [facility] wants you to be able to say your pain was always managed very well.  Zion Lint Hristova 07/08/2017

## 2017-07-08 NOTE — Transfer of Care (Signed)
Immediate Anesthesia Transfer of Care Note  Patient: Anna Nguyen  Procedure(s) Performed: CESAREAN SECTION (N/A Abdomen)  Patient Location: PACU  Anesthesia Type:Spinal  Level of Consciousness: awake, alert  and oriented  Airway & Oxygen Therapy: Patient Spontanous Breathing  Post-op Assessment: Report given to RN  Post vital signs: Reviewed  Last Vitals:  Vitals:   07/08/17 1230 07/08/17 1412  BP: 114/66   Pulse: 81   Resp: 18   Temp:    SpO2:  100%    Last Pain:  Vitals:   07/08/17 1253  TempSrc:   PainSc: 5       Patients Stated Pain Goal: 10 (07/08/17 0801)  Complications: No apparent anesthesia complications

## 2017-07-08 NOTE — H&P (Deleted)
  The note originally documented on this encounter has been moved the the encounter in which it belongs.  
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
## 2017-07-08 NOTE — Anesthesia Postprocedure Evaluation (Signed)
Anesthesia Post Note  Patient: Company secretaryCharisse Igo  Procedure(s) Performed: CESAREAN SECTION (N/A Abdomen)     Patient location during evaluation: PACU Anesthesia Type: Epidural Level of consciousness: oriented, patient cooperative and awake and alert Pain management: pain level controlled Vital Signs Assessment: post-procedure vital signs reviewed and stable Respiratory status: spontaneous breathing, nonlabored ventilation and respiratory function stable Cardiovascular status: blood pressure returned to baseline and stable Postop Assessment: epidural receding, patient able to bend at knees and no apparent nausea or vomiting Anesthetic complications: no    Last Vitals:  Vitals:   07/08/17 1615 07/08/17 1700  BP: 127/83 128/76  Pulse: 86 79  Resp: (!) 29 20  Temp:  36.9 C  SpO2:  98%    Last Pain:  Vitals:   07/08/17 1700  TempSrc: Oral  PainSc: 7    Pain Goal: Patients Stated Pain Goal: 10 (07/08/17 0801)               Erling CruzJACKSON,E. Oriel Ojo

## 2017-07-08 NOTE — Op Note (Signed)
07/07/2017 - 07/08/2017  1:58 PM  PATIENT:  Anna Nguyen  31 y.o. female  PRE-OPERATIVE DIAGNOSIS:  nonreassuring fetal heart rate remote from vaginal delivery.  POST-OPERATIVE DIAGNOSIS:  same  PROCEDURE:  Procedure(s): CESAREAN SECTION (N/A)  SURGEON:  Surgeon(s) and Role:    * Carrington ClampHorvath, Azavion Bouillon, MD - Primary  ASSISTANTS: none   ANESTHESIA:   epidural  EBL:  1485 mL   BLOOD ADMINISTERED:none  SPECIMEN:  Source of Specimen:  placenta  DISPOSITION OF SPECIMEN:  PATHOLOGY  COUNTS:  YES  TOURNIQUET:  * No tourniquets in log *  DICTATION: .Note written in EPIC  PLAN OF CARE: Admit to inpatient   PATIENT DISPOSITION:  PACU - hemodynamically stable.   Delay start of Pharmacological VTE agent (>24hrs) due to surgical blood loss or risk of bleeding: not applicable  Complications:  none Medications:  Ancef, Pitocin Findings:  Baby female, Apgars 8,9, weight P.   Normal tubes, ovaries and uterus seen.  Baby was skin to skin with mother after birth in the OR.  Technique:  After adequate epidural anesthesia was achieved, the patient was prepped and draped in usual sterile fashion with betadine since the heart rate remained down for about 9-10 minutes.  A foley catheter was already draining the bladder.  A pfannanstiel incision was made with the scalpel and carried down to the fascia with the bovie cautery. The fascia was incised in the midline with the scalpel and carried in a transverse curvilinear manner bilaterally.  The fascia was reflected superiorly and inferiorly off the rectus muscles and the muscles split in the midline.  A bowel free portion of the peritoneum was entered bluntly and then extended in a superior and inferior manner with good visualization of the bowel and bladder.  The Alexis instrument was then placed and the vesico-uterine fascia tented up and incised in a transverse curvilinear manner.  A 2 cm transverse incision was made in the upper portion of the  lower uterine segment until the amnion was exposed.   The incision was extended transversely in a blunt manner.  Light mec fluid was noted and the baby delivered in the vertex OP presentation without complication.  The baby was bulb suctioned and the cord was clamped and cut.  The baby was then handed to awaiting Neonatology.  The placenta was then delivered manually and the uterus cleared of all debris.  The uterine incision was then closed with a running lock stitch of 0 monocryl.  An imbricating layer of 0 monocryl was closed as well. Excellent hemostasis of the uterine incision was achieved and the abdomen was cleared with irrigation.  The peritoneum was closed with a running stitch of 2-0 vicryl.  This incorporated the rectus muscles as a separate layer.  The fascia was then closed with a running stitch of 0 vicryl.  The subcutaneous layer was closed with interrupted  stitches of 2-0 plain gut.  The skin was closed with 4-0 vicryl on a Keith needle and steri-strips.  The patient tolerated the procedure well and was returned to the recovery room in stable condition.  All counts were correct times three.  Joanthan Hlavacek A

## 2017-07-08 NOTE — Anesthesia Procedure Notes (Signed)
Epidural Patient location during procedure: OB Start time: 07/08/2017 5:08 AM End time: 07/08/2017 5:12 AM  Staffing Anesthesiologist: Kedarius Aloisi  Preanesthetic Checklist Completed: patient identified, site marked, surgical consent, pre-op evaluation, timeout performed, IV checked, risks and benefits discussed and monitors and equipment checked  Epidural Patient position: sitting Prep: site prepped and draped and DuraPrep Patient monitoring: continuous pulse ox and blood pressure Approach: midline Location: L4-L5 Injection technique: LOR air  Needle:  Needle type: Tuohy  Needle gauge: 17 G Needle length: 9 cm and 9 Needle insertion depth: 8 cm Catheter type: closed end flexible Catheter size: 19 Gauge Catheter at skin depth: 13 cm Test dose: negative  Assessment Events: blood not aspirated, injection not painful, no injection resistance, negative IV test and no paresthesia

## 2017-07-09 LAB — CBC
HEMATOCRIT: 25.3 % — AB (ref 36.0–46.0)
HEMOGLOBIN: 8.7 g/dL — AB (ref 12.0–15.0)
MCH: 30.3 pg (ref 26.0–34.0)
MCHC: 34.4 g/dL (ref 30.0–36.0)
MCV: 88.2 fL (ref 78.0–100.0)
Platelets: 110 10*3/uL — ABNORMAL LOW (ref 150–400)
RBC: 2.87 MIL/uL — AB (ref 3.87–5.11)
RDW: 15.5 % (ref 11.5–15.5)
WBC: 9.1 10*3/uL (ref 4.0–10.5)

## 2017-07-09 LAB — CULTURE, OB URINE

## 2017-07-09 LAB — GLUCOSE, CAPILLARY: Glucose-Capillary: 67 mg/dL (ref 65–99)

## 2017-07-09 MED ORDER — NALBUPHINE HCL 10 MG/ML IJ SOLN
5.0000 mg | INTRAMUSCULAR | Status: DC | PRN
  Administered 2017-07-09 – 2017-07-10 (×6): 5 mg via INTRAVENOUS
  Filled 2017-07-09 (×6): qty 1

## 2017-07-09 NOTE — Lactation Note (Signed)
This note was copied from a baby's chart. Lactation Consultation Note  Patient Name: Boy Elzie RingsCharisse Cadmus Today's Date: 07/09/2017 Reason for consult: Follow-up assessment Type of Endocrine Disorder?: PCOS  Follow up with mom of 25 hour old infant. Infant with 1 BF for 10 minuets, formula x 5 of 10-25 cc, 7 voids and 3 stools in last 24 hours. Mom reports she is latching infant to the breast some. She reports she has pumped some and is not getting anything. Enc mom to continue pumping every 2-3 hours to stimulate milk production. Mom reports infant was circumcised and is sleepy, she reports she is still trying to feed him every 2-3 hours. Mom reports she has no questions/concerns at this time. She declined needing assistance from Lactation at this time. Enc mom to call for any questions/concerns as needed.    Maternal Data Formula Feeding for Exclusion: Yes Reason for exclusion: Mother's choice to formula and breast feed on admission Has patient been taught Hand Expression?: Yes  Feeding Feeding Type: Bottle Fed - Formula Nipple Type: Slow - flow  LATCH Score                   Interventions    Lactation Tools Discussed/Used Pump Review: Setup, frequency, and cleaning;Milk Storage Initiated by:: Reviewed and encouraged every 2-3hours   Consult Status Consult Status: Follow-up Date: 07/10/17 Follow-up type: In-patient    Silas FloodSharon S Ireta Pullman 07/09/2017, 3:17 PM

## 2017-07-09 NOTE — Progress Notes (Signed)
Orthostatic blood pressures complete. Attempted to ambulate patient to the bathroom. Patient stated "I can't, I'm in too much pain." Will attempt ambulation later.

## 2017-07-09 NOTE — Addendum Note (Signed)
Addendum  created 07/09/17 0803 by Earmon PhoenixWilkerson, Avin Upperman P, CRNA   Sign clinical note

## 2017-07-09 NOTE — Clinical Social Work Maternal (Signed)
CLINICAL SOCIAL WORK MATERNAL/CHILD NOTE  Patient Details  Name: Anna Nguyen MRN: [id number] Date of Birth: 10/18/1985  Date:  07/09/2017  Clinical Social Worker Initiating Note:  Graceanne Guin Boyd-Gilyard Date/Time: Initiated:  07/09/17/1150     Child's Name:  Anna Guzzi Jr.    Biological Parents:  Mother, Father   Need for Interpreter:  None   Reason for Referral:  Behavioral Health Concerns   Address:  [address] [place] [address]    Phone number:  [phone] (home)     Additional phone number:   Household Members/Support Persons (HM/SP):   Household Member/Support Person 1, Household Member/Support Person 2   HM/SP Name Relationship DOB or Age  HM/SP -1 Anna Brant Sr.  Husband/FOB unknown  HM/SP -2 Anna Nguyen daughter 06/11/16  HM/SP -3        HM/SP -4        HM/SP -5        HM/SP -6        HM/SP -7        HM/SP -8          Natural Supports (not living in the home):  Friends (FOB's family will provide support.)   Professional Supports: Therapist   Employment: Full-time   Type of Work: [employer].    Education:  College graduate   Homebound arranged:    Financial Resources:  Private Insurance   Other Resources:      Cultural/Religious Considerations Which May Impact Care:  Per MOB's Face Sheet, MOB is Non-Denominational.  Strengths:  Ability to meet basic needs , Home prepared for child , Understanding of illness, Psychotropic Medications   Psychotropic Medications:  Lexapro, Other meds (Vistaril)      Pediatrician:       Pediatrician List:   [place]    [place]    [place]    [place]    [place]    [place]      Pediatrician Fax Number:     Risk Factors/Current Problems:  Mental Health Concerns    Cognitive State:  Able to Concentrate , Linear Thinking , Insightful    Mood/Affect:  Comfortable , Interested , Calm , Flat , Relaxed    CSW  Assessment: CSW met with MOB to complete an assessment for MH hx.  MOB was resting in bed, FOB was watching TV, and infant was asleep on the couch.  MOB gave CSW permission to meet with MOB while FOB was present  (CSW attempted to have FOB to leave the room, however, MOB was adamant that FOB remained). MOB was polite and receptive to meeting with CSW. FOB and MOB were engage and forthcoming.   CSW inquired about MOB's thoughts and feeling about being a new mom again and MOB expressed, "I'm really excited." FOB happily shared that infant is a "Jr."   CSW asked about MOB's MH hx and MOB openly shared that MOB has dx of bipolar and often experience depression symptoms. MOB communicated that MOB is an established patient with an agency on [address], in [place], but was unable to provided CSW with therapist name or practice name. MOB and FOB assured CSW that MOB has contact information at home. MOB reports currently taking Lexpro and Vistraril and feeling good about the benefits of medication management. MOB appeared to have insight and awareness about her MH and did not present with any acute mental health symptoms.  CSW assessed for safety and MOB denied SI,   HI, and DV. MOB also openly shared MOB's suicide attempt (July 2018) and reported that MOB's martial issues has since resolved.   CSW provided education regarding Baby Blues vs PMADs and provided MOB with information about support groups held at [facility].  CSW encouraged MOB to evaluate her mental health throughout the postpartum period with the use of the New Mom Checklist developed by [facility] and notify a medical professional if symptoms arise.    CSW provided SIDS education and MOB reported having all necessary items for infant.  T  CSW Plan/Description:  Perinatal Mood and Anxiety Disorder (PMADs) Education, Other Patient/Family Education, Other Information/Referral to Community Resources, Sudden Infant Death Syndrome (SIDS)  Education, No Further Intervention Required/No Barriers to Discharge   Brigida Scotti Boyd-Gilyard, MSW, LCSW Clinical Social Work [phone]  Leaha Cuervo D BOYD-GILYARD, LCSW 07/09/2017, 11:57 AM 

## 2017-07-09 NOTE — Progress Notes (Signed)
Patient ID: Anna Nguyen, female   DOB: 10/05/1985, 31 y.o.   MRN: [id number]    Desires neonatal circumcision, R/B/A of procedure discussed at length. Pt understands that neonatal circumcision is not considered medically necessary and is elective. The risks include, but are not limited to bleeding, infection, damage to the penis, development of scar tissue, and having to have it redone at a later date. Pt understands theses risks and wishes to proceed

## 2017-07-09 NOTE — Anesthesia Postprocedure Evaluation (Signed)
Anesthesia Post Note  Patient: Company secretaryCharisse Nguyen  Procedure(s) Performed: CESAREAN SECTION (N/A Abdomen)     Patient location during evaluation: Mother Baby Anesthesia Type: Epidural Level of consciousness: awake Pain management: pain level controlled Vital Signs Assessment: post-procedure vital signs reviewed and stable Respiratory status: spontaneous breathing Cardiovascular status: stable Postop Assessment: no headache, epidural receding and patient able to bend at knees Anesthetic complications: no    Last Vitals:  Vitals:   07/08/17 2352 07/09/17 0402  BP: 123/76 121/69  Pulse: 72 83  Resp: 18 18  Temp: 37 C 37.1 C  SpO2: 97% 98%    Last Pain:  Vitals:   07/09/17 0715  TempSrc:   PainSc: 0-No pain   Pain Goal: Patients Stated Pain Goal: 10 (07/08/17 0801)               Edison PaceWILKERSON,Scottlyn Mchaney

## 2017-07-09 NOTE — Progress Notes (Signed)
Subjective: Postpartum Day 1: Cesarean Delivery Patient reports pain controlled, no nausea or vomiting. Pt was having intense systemic itching that benadryl did not improve. Nubain x 1 given and patient "is a different person"  Objective: Vital signs in last 24 hours: Temp:  [97.7 F (36.5 C)-98.7 F (37.1 C)] 98.7 F (37.1 C) (11/01 0402) Pulse Rate:  [72-95] 83 (11/01 0402) Resp:  [15-32] 18 (11/01 0402) BP: (91-133)/(44-90) 121/69 (11/01 0402) SpO2:  [95 %-100 %] 98 % (11/01 0402)  Physical Exam:  General: alert, cooperative and appears stated age Lochia: appropriate Uterine Fundus: firm Incision: pressure dressing C/D/I DVT Evaluation: No evidence of DVT seen on physical exam.   Recent Labs  07/07/17 2221 07/09/17 0557  HGB 10.4* 8.7*  HCT 31.8* 25.3*    Assessment/Plan: Status post Cesarean section. Doing well postoperatively.  Continue current care.  Kimball Manske H. 07/09/2017, 10:53 AM

## 2017-07-10 MED ORDER — OXYCODONE HCL 10 MG PO TABS: 10.0000 mg | ORAL_TABLET | ORAL | 0 refills | Status: DC | PRN

## 2017-07-10 NOTE — Progress Notes (Signed)
  Patient is eating, ambulating, voiding.  Pain control is good.  Vitals:   07/09/17 0402 07/09/17 0815 07/09/17 1132 07/09/17 1747  BP: 121/69 118/69 120/66 109/65  Pulse: 83 75 79 84  Resp: 18 20 20 19   Temp: 98.7 F (37.1 C) 98.1 F (36.7 C) 98 F (36.7 C) 98.3 F (36.8 C)  TempSrc: Oral Oral Oral Oral  SpO2: 98% 97% 98%   Weight:      Height:        lungs:   clear to auscultation cor:    RRR Abdomen:  soft, appropriate tenderness, incisions intact and without erythema or exudate ex:    no cords   Lab Results  Component Value Date   WBC 9.1 07/09/2017   HGB 8.7 (L) 07/09/2017   HCT 25.3 (L) 07/09/2017   MCV 88.2 07/09/2017   PLT 110 (L) 07/09/2017    --/--/O POS (10/30 2221)/RI  A/P    Post operative day 2.  Routine post op and postpartum care.  Expect d/c today.  Percocet for pain control.

## 2017-07-10 NOTE — Discharge Summary (Signed)
Obstetric Discharge Summary Reason for Admission: rupture of membranes Prenatal Procedures: none Intrapartum Procedures: cesarean: low cervical, transverse Postpartum Procedures: none Complications-Operative and Postpartum: none Hemoglobin  Date Value Ref Range Status  07/09/2017 8.7 (L) 12.0 - 15.0 g/dL Final   HGB  Date Value Ref Range Status  05/09/2016 10.0 (L) 11.6 - 15.9 g/dL Final   HCT  Date Value Ref Range Status  07/09/2017 25.3 (L) 36.0 - 46.0 % Final  05/09/2016 30.4 (L) 34.8 - 46.6 % Final    Discharge Diagnoses: Term Pregnancy-delivered  Discharge Information: Date: 07/10/2017 Activity: pelvic rest Diet: routine Medications: Iron and Percocet Condition: stable Instructions: refer to practice specific booklet Discharge to: home Follow-up Information    Carrington ClampHorvath, Aaren Krog, MD Follow up in 4 week(s).   Specialty:  Obstetrics and Gynecology Contact information: [address] [place] [address] [phone]           Newborn Data: Live born female  Birth Weight: 6 lb 12.8 oz (3085 g) APGAR: 8, 9  Newborn Delivery   Birth date/time:  07/08/2017 13:24:00 Delivery type:  C-Section, Low Transverse  C-section categorization:  Primary     Home with mother.  Anna Nguyen 07/10/2017, 5:56 AM

## 2017-07-16 ENCOUNTER — Inpatient Hospital Stay (HOSPITAL_COMMUNITY): Admission: RE | Admit: 2017-07-16 | Payer: 59 | Source: Ambulatory Visit

## 2017-10-04 IMAGING — US US MFM OB LIMITED
1 series · 15 of 24 positions shown · non-contrast
Comparison: none

[Series 1: us mfm ob limited · 15 of 24 slices shown]
[im 1/24]
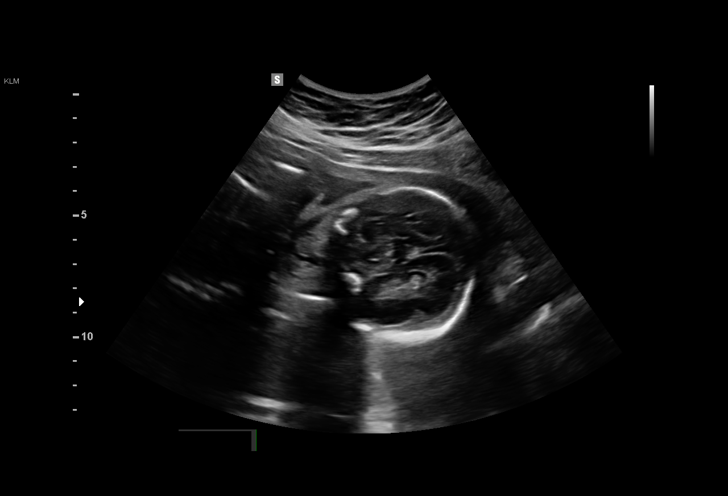
[im 3/24]
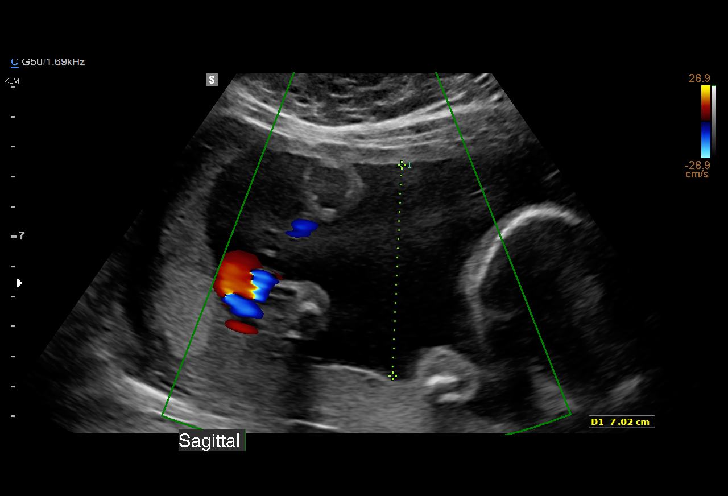
[im 5/24]
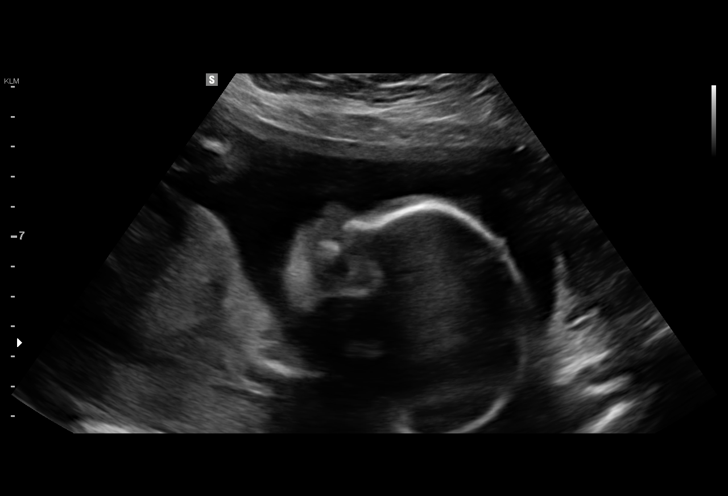
[im 6/24]
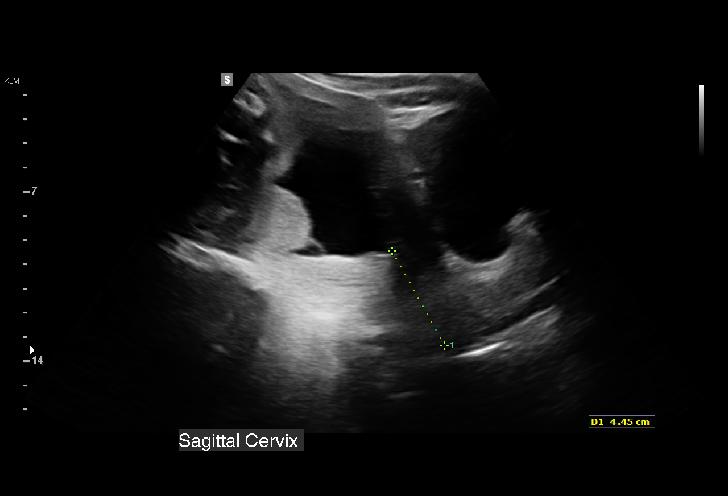
[im 8/24]
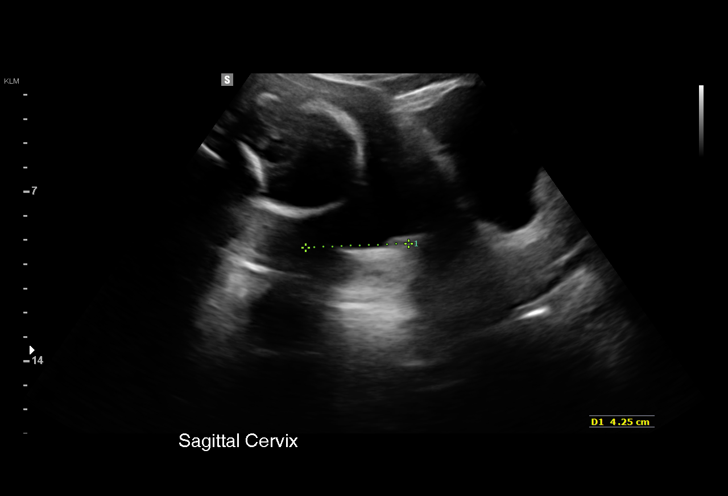
[im 9/24]
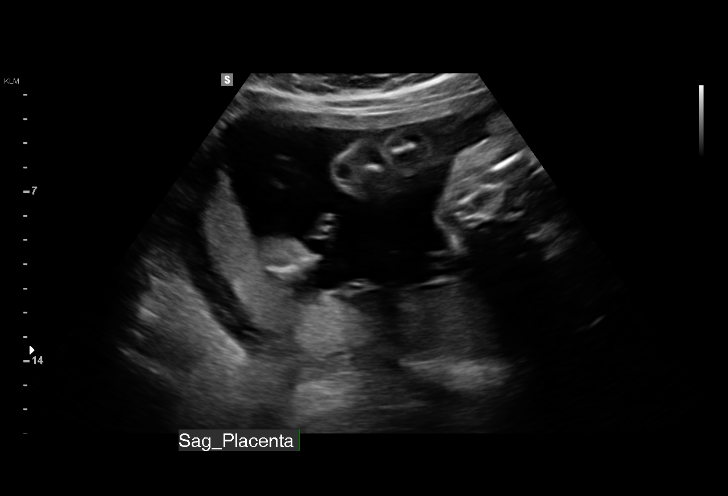
[im 11/24]
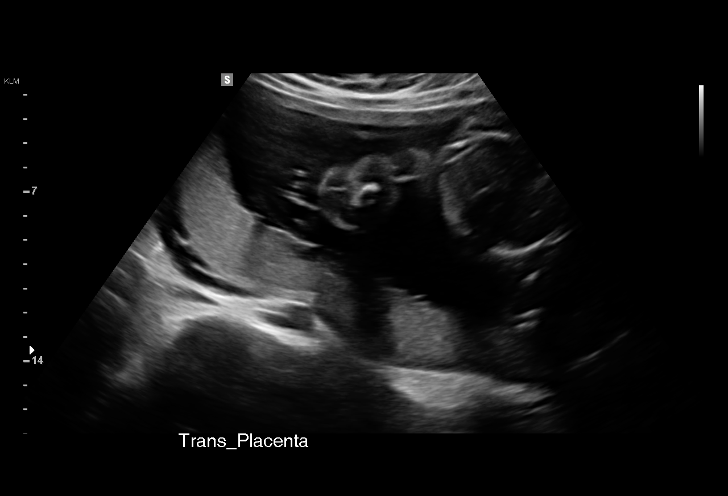
[im 13/24]
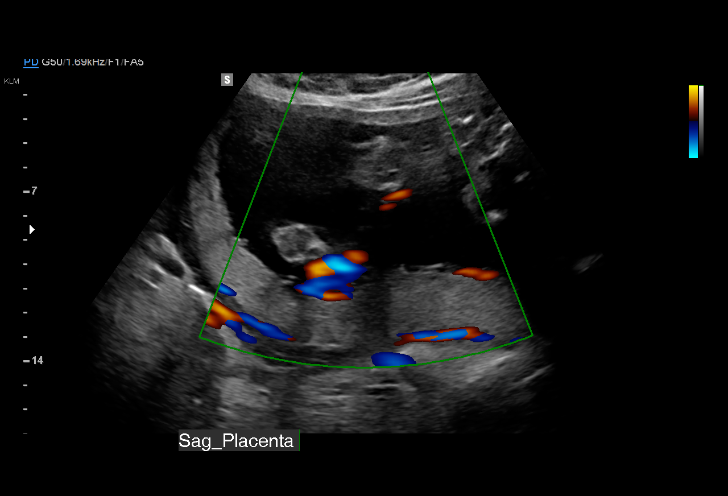
[im 14/24]
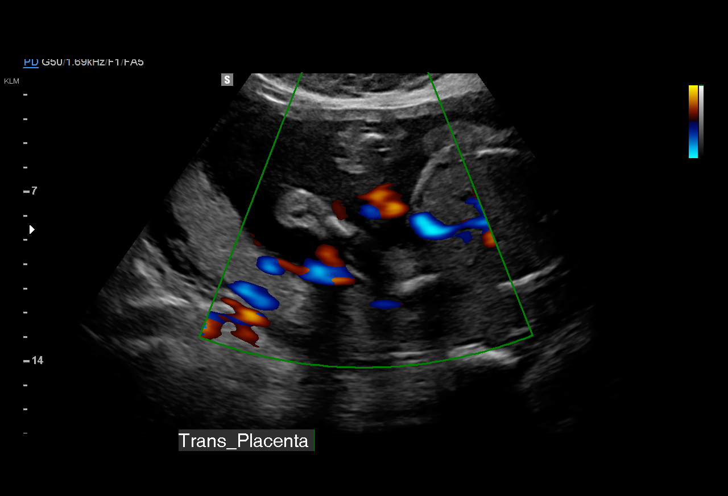
[im 16/24]
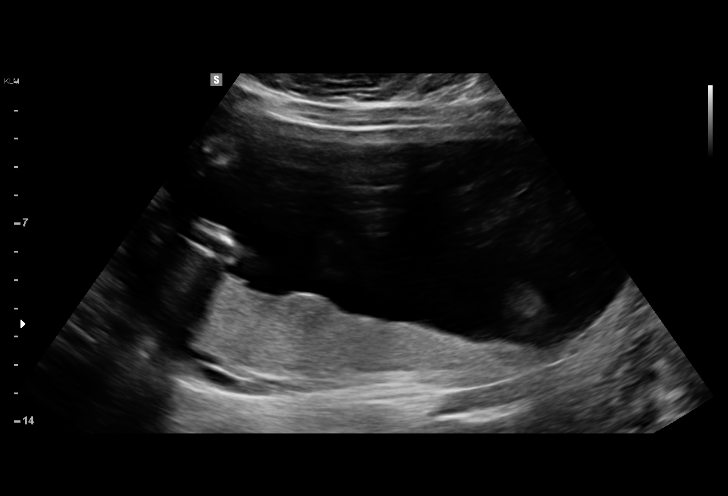
[im 17/24]
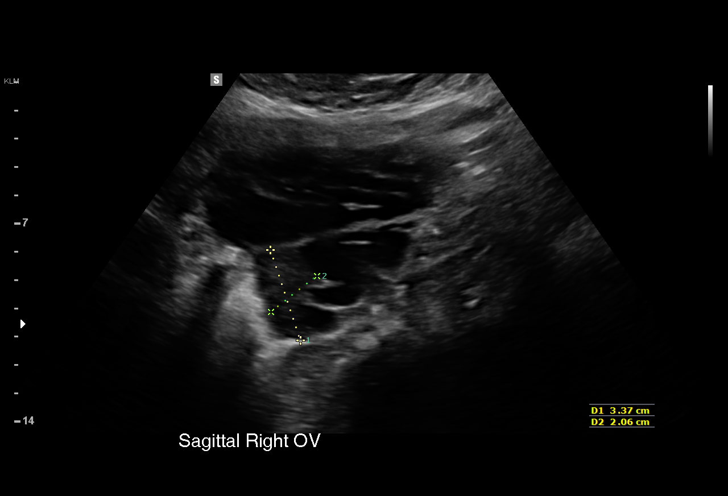
[im 19/24]
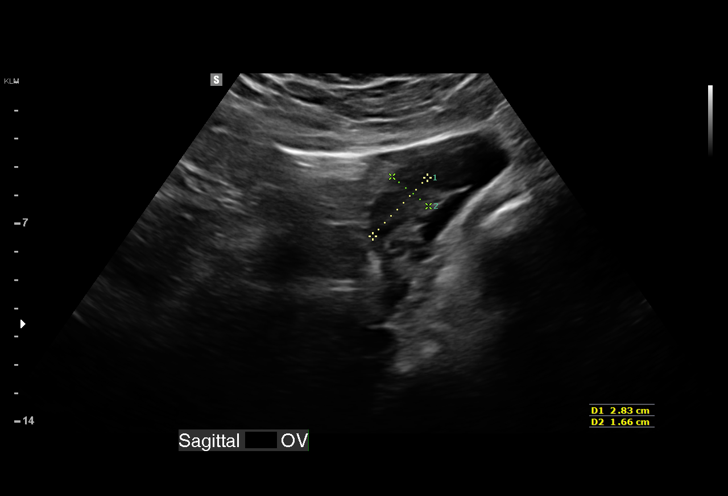
[im 21/24]
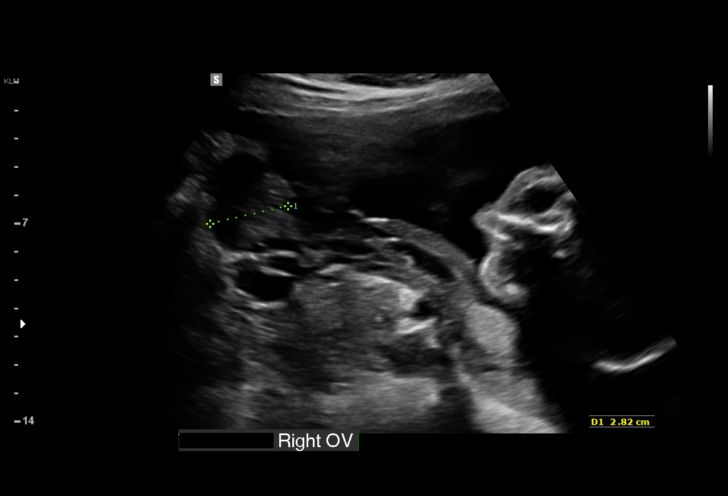
[im 22/24]
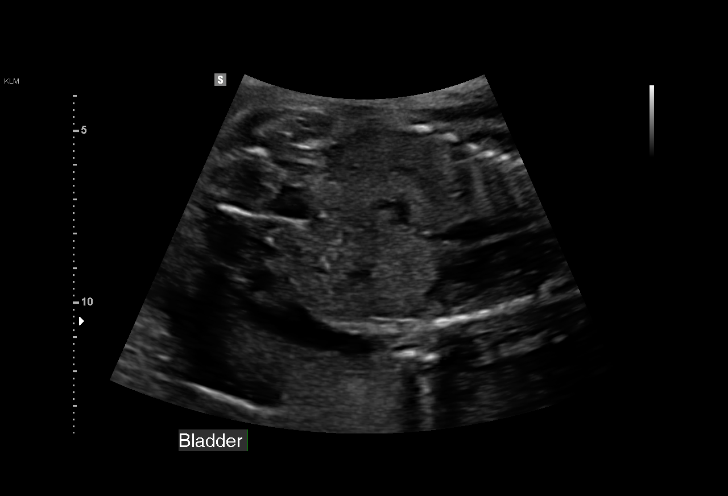
[im 24/24]
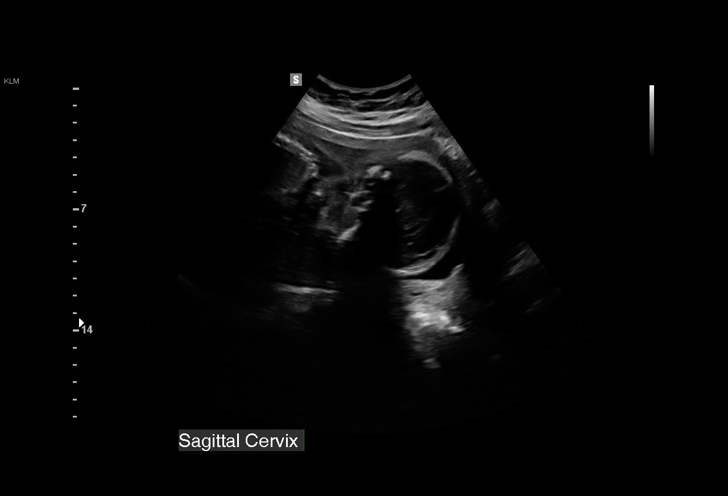

[15 of 24 positions shown; findings below may reference images not displayed]

Attending:        Herlanoz Midad Fatoni        Secondary Phy.:   TEBELO DESMOND Nursing-
MAU/Triage

Indications

24 weeks gestation of pregnancy
Diabetes - Pregestational, 2nd trimester
Hypertension - Chronic/Pre-existing
Vaginal discharge during pregnancy in
second trimester (-[place], - Amnisure)
OB History

Gravidity:    3         Term:   0        Prem:   0        SAB:   0
TOP:          2       Ectopic:  0        Living: 0
Fetal Evaluation

Num Of Fetuses:     1
Fetal Heart         159
Rate(bpm):
Cardiac Activity:   Observed
Presentation:       Cephalic
Placenta:           Posterior, above cervical os
P. Cord Insertion:  Visualized, central

Amniotic Fluid
AFI FV:      Subjectively within normal limits

Largest Pocket(cm)
7.0
Gestational Age

Clinical EDD:  24w 6d                                        EDD:   06/12/16
Best:          24w 6d    Det. By:   Clinical EDD             EDD:   06/12/16
Cervix Uterus Adnexa

Cervix
Length:            4.5  cm.
Normal appearance by transabdominal scan.

Uterus
No abnormality visualized.

Left Ovary
Within normal limits.

Right Ovary
Within normal limits.

Cul De Sac:   No free fluid seen.

Adnexa:       No abnormality visualized.
Impression

Single IUP at 24w 6d
Limited ultrasound performed
Cephalic presentation
Posterior placenta
Normal amniotic fluid volume
Recommendations

Follow-up ultrasounds as clinically indicated.

## 2017-11-03 DIAGNOSIS — G4709 Other insomnia: Secondary | ICD-10-CM | POA: Diagnosis not present

## 2017-11-17 DIAGNOSIS — J019 Acute sinusitis, unspecified: Secondary | ICD-10-CM | POA: Diagnosis not present

## 2017-11-17 DIAGNOSIS — R05 Cough: Secondary | ICD-10-CM | POA: Diagnosis not present

## 2017-11-23 ENCOUNTER — Other Ambulatory Visit: Payer: Self-pay

## 2017-11-23 ENCOUNTER — Emergency Department (HOSPITAL_BASED_OUTPATIENT_CLINIC_OR_DEPARTMENT_OTHER)
Admission: EM | Admit: 2017-11-23 | Discharge: 2017-11-23 | Disposition: A | Discharge status: Home / Self Care | Payer: 59 | Attending: Emergency Medicine | Admitting: Emergency Medicine

## 2017-11-23 ENCOUNTER — Emergency Department (HOSPITAL_BASED_OUTPATIENT_CLINIC_OR_DEPARTMENT_OTHER): Payer: 59

## 2017-11-23 ENCOUNTER — Encounter (HOSPITAL_BASED_OUTPATIENT_CLINIC_OR_DEPARTMENT_OTHER): Payer: Self-pay | Admitting: *Deleted

## 2017-11-23 DIAGNOSIS — E119 Type 2 diabetes mellitus without complications: Secondary | ICD-10-CM | POA: Insufficient documentation

## 2017-11-23 DIAGNOSIS — Z79899 Other long term (current) drug therapy: Secondary | ICD-10-CM | POA: Diagnosis not present

## 2017-11-23 DIAGNOSIS — B9789 Other viral agents as the cause of diseases classified elsewhere: Secondary | ICD-10-CM

## 2017-11-23 DIAGNOSIS — B349 Viral infection, unspecified: Secondary | ICD-10-CM | POA: Diagnosis not present

## 2017-11-23 DIAGNOSIS — R0602 Shortness of breath: Secondary | ICD-10-CM | POA: Diagnosis not present

## 2017-11-23 DIAGNOSIS — J069 Acute upper respiratory infection, unspecified: Secondary | ICD-10-CM | POA: Insufficient documentation

## 2017-11-23 DIAGNOSIS — Z91040 Latex allergy status: Secondary | ICD-10-CM | POA: Diagnosis not present

## 2017-11-23 DIAGNOSIS — R05 Cough: Secondary | ICD-10-CM | POA: Diagnosis present

## 2017-11-23 MED ORDER — BENZONATATE 100 MG PO CAPS
100.0000 mg | ORAL_CAPSULE | Freq: Once | ORAL | Status: AC
  Administered 2017-11-23: 100 mg via ORAL
  Filled 2017-11-23: qty 1

## 2017-11-23 MED ORDER — ALBUTEROL SULFATE HFA 108 (90 BASE) MCG/ACT IN AERS
2.0000 | INHALATION_SPRAY | Freq: Once | RESPIRATORY_TRACT | Status: AC
  Administered 2017-11-23: 2 via RESPIRATORY_TRACT
  Filled 2017-11-23: qty 6.7

## 2017-11-23 MED ORDER — PROMETHAZINE-DM 6.25-15 MG/5ML PO SYRP: 5.0000 mL | ORAL_SOLUTION | Freq: Four times a day (QID) | ORAL | 0 refills | Status: DC | PRN

## 2017-11-23 MED ORDER — HYDROCODONE-ACETAMINOPHEN 7.5-325 MG/15ML PO SOLN
10.0000 mL | Freq: Once | ORAL | Status: AC
  Administered 2017-11-23: 10 mL via ORAL
  Filled 2017-11-23: qty 15

## 2017-11-23 NOTE — ED Notes (Signed)
Patient transported to X-ray 

## 2017-11-23 NOTE — Discharge Instructions (Addendum)
You were seen today for cough.  Continue medications at home as directed.  Use honey and your inhaler.

## 2017-11-23 NOTE — ED Triage Notes (Signed)
Pt reports cough x 1 week. Child has same Sx and was dx with RSV. Pt states she went to Urgent Care and was given steroids. She has been using a family member's albuterol. States she starts coughing and can't stop. Assessed by RT in triage. C/o rib pain. Pt with strong frequent cough in triage

## 2017-11-23 NOTE — ED Notes (Signed)
Pt is coughing nonstop when brought to room. Assisted with gown. Scattered rhonchi bilat. Child recently diagnosed with RSV. States she is sore all over from coughing.

## 2017-11-23 NOTE — ED Provider Notes (Signed)
[facility] EMERGENCY DEPARTMENT Provider Note   CSN: [id number] Arrival date & time: 11/23/17  0053     History   Chief Complaint Chief Complaint  Patient presents with  . Cough    HPI Anna Nguyen is a 32 y.o. female.  HPI  This is a 32 year old female who presents with cough.  Patient reports 1 week history of upper respiratory symptoms and cough.  She was seen and evaluated at urgent care on Tuesday.  She was given prednisone and amoxicillin.  She reports that her daughter was diagnosed with RSV.  Patient reports persistently worsening cough which is keeping her up at night.  It is productive.  She has tried multiple over-the-counter medications as well as hot tea with honey with minimal relief.  She reports chills without fevers.  She is a non-smoker.  No history of asthma.  Denies any abdominal pain, nausea, vomiting, diarrhea.  She has used a family member's albuterol with some relief.  Past Medical History:  Diagnosis Date  . Anemia, iron deficiency   . HTN (hypertension)    with last pregnancy  . PCOS (polycystic ovarian syndrome)   . Type II or unspecified type diabetes mellitus without mention of complication, not stated as uncontrolled    not on insulin    Patient Active Problem List   Diagnosis Date Noted  . Indication for care in labor or delivery 07/07/2017  . Major depressive disorder, recurrent severe without psychotic features (HCC) 03/13/2017  . Pregnancy 06/10/2016  . Severe hyperemesis gravidarum 03/25/2016  . Nausea/vomiting in pregnancy 01/16/2016  . Anemia affecting pregnancy in second trimester 01/16/2016  . 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  . Hyperglycemia 08/22/2013  . Vitamin D deficiency 07/15/2013  . PCOS (polycystic ovarian syndrome) 02/28/2013  . B12 deficiency anemia 07/14/2012  . Folate deficiency anemia 07/14/2012  . Contraception management 05/06/2011  . Gastric bypass status for obesity 05/06/2011  . Iron deficiency  anemia 10/02/2009    Past Surgical History:  Procedure Laterality Date  . CESAREAN SECTION N/A 07/08/2017   Procedure: CESAREAN SECTION;  Surgeon: Carrington ClampHorvath, Michelle, MD;  [facility];  Service: Obstetrics;  Laterality: N/A;  . LAPAROSCOPIC GASTRIC BANDING    . TONSILLECTOMY    . WISDOM TOOTH EXTRACTION      OB History    Gravida Para Term Preterm AB Living   4 2 2   2 2    SAB TAB Ectopic Multiple Live Births     2   0 2       Home Medications    Prior to Admission medications   Medication Sig Start Date End Date Taking? Authorizing Provider  escitalopram (LEXAPRO) 10 MG tablet Take 10 mg by mouth at bedtime.   Yes [provider]  esomeprazole (NEXIUM) 40 MG capsule Take 40 mg by mouth daily as needed (For heartburn.).    [provider]  hydrOXYzine (ATARAX/VISTARIL) 25 MG tablet Take 1 tablet (25 mg total) by mouth every 6 (six) hours as needed for anxiety. Take 1 tablet as needed every 5 hours for anxiety. Take 2 tablets at bedtime as needed for sleep. Patient taking differently: Take 25-50 mg by mouth 2 (two) times daily as needed for anxiety. Take 1 tablet as needed every 5 hours for anxiety. Take 2 tablets at bedtime as needed for sleep. 03/16/17   Denzil Magnusonhomas, Lashunda, NP  oxyCODONE 10 MG TABS Take 1 tablet (10 mg total) by mouth every 4 (four) hours as needed (  pain scale > 7). 07/10/17   Carrington Clamp, MD  promethazine (PHENERGAN) 25 MG tablet Take 1 tablet (25 mg total) by mouth every 6 (six) hours as needed for nausea or vomiting. 02/09/17   Rasch, Victorino Dike I, NP  promethazine-dextromethorphan (PROMETHAZINE-DM) 6.25-15 MG/5ML syrup Take 5 mLs by mouth 4 (four) times daily as needed for cough. 11/23/17   Tylor Gambrill, Mayer Masker, MD    Family History Family History  Problem Relation Age of Onset  . Arthritis Other   . Cancer Maternal Aunt        breast  . Diabetes Maternal Uncle   . Hyperlipidemia Maternal Uncle   . Hypertension Maternal Uncle    . Asthma Maternal Grandmother   . Diabetes Maternal Grandmother   . Hyperlipidemia Maternal Grandmother   . Hypertension Maternal Grandmother   . Asthma Maternal Grandfather   . Diabetes Maternal Grandfather   . Hyperlipidemia Maternal Grandfather   . Hypertension Maternal Grandfather   . Prostate cancer Paternal Grandfather     Social History Social History   Tobacco Use  . Smoking status: Never Smoker  . Smokeless tobacco: Never Used  Substance Use Topics  . Alcohol use: No    Alcohol/week: 0.0 oz  . Drug use: No     Allergies   Aspirin; Nsaids; and Latex   Review of Systems Review of Systems  Constitutional: Negative for chills and fever.  HENT: Positive for congestion. Negative for trouble swallowing.   Respiratory: Positive for cough and shortness of breath.   Cardiovascular: Positive for chest pain.  Gastrointestinal: Negative for abdominal pain, diarrhea, nausea and vomiting.  Genitourinary: Negative for dysuria.  Neurological: Negative for dizziness.  All other systems reviewed and are negative.    Physical Exam Updated Vital Signs BP (!) 144/104 (BP Location: Left Arm)   Pulse (!) 104   Temp 99.2 F (37.3 C) (Oral)   Resp 18   Ht 5\' 9"  (1.753 m)   Wt 108.9 kg (240 lb)   Breastfeeding? No   BMI 35.44 kg/m   Physical Exam  Constitutional: She is oriented to person, place, and time. She appears well-developed and well-nourished.  Obese, persistent forceful cough noted  HENT:  Head: Normocephalic and atraumatic.  Mouth/Throat: Oropharynx is clear and moist.  Eyes: Pupils are equal, round, and reactive to light.  Cardiovascular: Normal rate, regular rhythm and normal heart sounds.  Pulmonary/Chest: Effort normal and breath sounds normal. No respiratory distress. She has no wheezes.  Frequent forceful coughing  Abdominal: Soft. Bowel sounds are normal. There is no tenderness. There is no guarding.  Neurological: She is alert and oriented to  person, place, and time.  Skin: Skin is warm and dry.  Psychiatric: She has a normal mood and affect.  Nursing note and vitals reviewed.    ED Treatments / Results  Labs (all labs ordered are listed, but only abnormal results are displayed) Labs Reviewed - No data to display  EKG  EKG Interpretation None       Radiology Dg Chest 2 View  Result Date: 11/23/2017 CLINICAL DATA:  Cough, fever, chills and body aches for 3 days. EXAM: CHEST - 2 VIEW COMPARISON:  Radiographs and CT March 2012 FINDINGS: The cardiomediastinal contours are normal. The lungs are clear. Pulmonary vasculature is normal. No consolidation, pleural effusion, or pneumothorax. No acute osseous abnormalities are seen. IMPRESSION: Unremarkable radiographs of the chest. Electronically Signed   By: Rubye Oaks M.D.   On: 11/23/2017 02:45    Procedures  Procedures (including critical care time)  Medications Ordered in ED Medications  albuterol (PROVENTIL HFA;VENTOLIN HFA) 108 (90 Base) MCG/ACT inhaler 2 puff (2 puffs Inhalation Given 11/23/17 0158)  benzonatate (TESSALON) capsule 100 mg (100 mg Oral Given 11/23/17 0202)  HYDROcodone-acetaminophen (HYCET) 7.5-325 mg/15 ml solution 10 mL (10 mLs Oral Given 11/23/17 0203)     Initial Impression / Assessment and Plan / ED Course  I have reviewed the triage vital signs and the nursing notes.  Pertinent labs & imaging results that were available during my care of the patient were reviewed by me and considered in my medical decision making (see chart for details).     Patient presents with cough.  Very forceful on exam.  Patient is otherwise nontoxic and in no acute distress.  O2 sats are 99%.  She has no wheezing on exam.  Patient was given Tessalon Perles, inhaler, and high set.  Chest x-ray does not show any evidence of pneumonia.  However, she is appropriately being treated with amoxicillin.  I discussed with patient continued supportive measures.  Recommend  spoonful of honey.  We will send home with a short course of promethazine/dextromethorphan cough syrup given the forceful nature of her cough.  After history, exam, and medical workup I feel the patient has been appropriately medically screened and is safe for discharge home. Pertinent diagnoses were discussed with the patient. Patient was given return precautions.   Final Clinical Impressions(s) / ED Diagnoses   Final diagnoses:  Viral URI with cough    ED Discharge Orders        Ordered    promethazine-dextromethorphan (PROMETHAZINE-DM) 6.25-15 MG/5ML syrup  4 times daily PRN     11/23/17 0311       Shon Baton, MD 11/23/17 [phone]

## 2018-02-10 DIAGNOSIS — Z3201 Encounter for pregnancy test, result positive: Secondary | ICD-10-CM | POA: Diagnosis not present

## 2018-02-10 DIAGNOSIS — Z3480 Encounter for supervision of other normal pregnancy, unspecified trimester: Secondary | ICD-10-CM | POA: Diagnosis not present

## 2018-02-10 DIAGNOSIS — Z124 Encounter for screening for malignant neoplasm of cervix: Secondary | ICD-10-CM | POA: Diagnosis not present

## 2018-02-10 DIAGNOSIS — O200 Threatened abortion: Secondary | ICD-10-CM | POA: Diagnosis not present

## 2018-02-10 DIAGNOSIS — Z01419 Encounter for gynecological examination (general) (routine) without abnormal findings: Secondary | ICD-10-CM | POA: Diagnosis not present

## 2018-02-12 DIAGNOSIS — O200 Threatened abortion: Secondary | ICD-10-CM | POA: Diagnosis not present

## 2018-02-15 DIAGNOSIS — O200 Threatened abortion: Secondary | ICD-10-CM | POA: Diagnosis not present

## 2018-03-01 DIAGNOSIS — O3680X1 Pregnancy with inconclusive fetal viability, fetus 1: Secondary | ICD-10-CM | POA: Diagnosis not present

## 2018-03-01 DIAGNOSIS — O3680X9 Pregnancy with inconclusive fetal viability, other fetus: Secondary | ICD-10-CM | POA: Diagnosis not present

## 2018-03-10 DIAGNOSIS — O3680X1 Pregnancy with inconclusive fetal viability, fetus 1: Secondary | ICD-10-CM | POA: Diagnosis not present

## 2018-04-16 DIAGNOSIS — Z3201 Encounter for pregnancy test, result positive: Secondary | ICD-10-CM | POA: Diagnosis not present

## 2018-05-04 DIAGNOSIS — Z3201 Encounter for pregnancy test, result positive: Secondary | ICD-10-CM | POA: Diagnosis not present

## 2018-05-06 DIAGNOSIS — Z3201 Encounter for pregnancy test, result positive: Secondary | ICD-10-CM | POA: Diagnosis not present

## 2018-05-07 DIAGNOSIS — M25569 Pain in unspecified knee: Secondary | ICD-10-CM | POA: Diagnosis not present

## 2018-05-12 DIAGNOSIS — O3680X1 Pregnancy with inconclusive fetal viability, fetus 1: Secondary | ICD-10-CM | POA: Diagnosis not present

## 2018-05-14 DIAGNOSIS — O200 Threatened abortion: Secondary | ICD-10-CM | POA: Diagnosis not present

## 2018-05-19 DIAGNOSIS — O26849 Uterine size-date discrepancy, unspecified trimester: Secondary | ICD-10-CM | POA: Diagnosis not present

## 2018-05-29 ENCOUNTER — Encounter (HOSPITAL_COMMUNITY): Payer: Self-pay | Admitting: Emergency Medicine

## 2018-05-29 ENCOUNTER — Other Ambulatory Visit: Payer: Self-pay

## 2018-05-29 ENCOUNTER — Emergency Department (HOSPITAL_COMMUNITY)
Admission: EM | Admit: 2018-05-29 | Discharge: 2018-05-29 | Disposition: A | Discharge status: Home / Self Care | Payer: 59 | Attending: Emergency Medicine | Admitting: Emergency Medicine

## 2018-05-29 DIAGNOSIS — Z79899 Other long term (current) drug therapy: Secondary | ICD-10-CM | POA: Insufficient documentation

## 2018-05-29 DIAGNOSIS — S61209A Unspecified open wound of unspecified finger without damage to nail, initial encounter: Secondary | ICD-10-CM

## 2018-05-29 DIAGNOSIS — X58XXXA Exposure to other specified factors, initial encounter: Secondary | ICD-10-CM | POA: Insufficient documentation

## 2018-05-29 DIAGNOSIS — Y939 Activity, unspecified: Secondary | ICD-10-CM | POA: Diagnosis not present

## 2018-05-29 DIAGNOSIS — E119 Type 2 diabetes mellitus without complications: Secondary | ICD-10-CM | POA: Insufficient documentation

## 2018-05-29 DIAGNOSIS — Z91040 Latex allergy status: Secondary | ICD-10-CM | POA: Diagnosis not present

## 2018-05-29 DIAGNOSIS — Y999 Unspecified external cause status: Secondary | ICD-10-CM | POA: Insufficient documentation

## 2018-05-29 DIAGNOSIS — I10 Essential (primary) hypertension: Secondary | ICD-10-CM | POA: Diagnosis not present

## 2018-05-29 DIAGNOSIS — Y929 Unspecified place or not applicable: Secondary | ICD-10-CM | POA: Insufficient documentation

## 2018-05-29 DIAGNOSIS — S61001A Unspecified open wound of right thumb without damage to nail, initial encounter: Secondary | ICD-10-CM | POA: Diagnosis not present

## 2018-05-29 DIAGNOSIS — Z23 Encounter for immunization: Secondary | ICD-10-CM | POA: Insufficient documentation

## 2018-05-29 DIAGNOSIS — S61101A Unspecified open wound of right thumb with damage to nail, initial encounter: Secondary | ICD-10-CM | POA: Insufficient documentation

## 2018-05-29 MED ORDER — LIDOCAINE HCL (PF) 1 % IJ SOLN
10.0000 mL | Freq: Once | INTRAMUSCULAR | Status: AC
  Administered 2018-05-29: 10 mL via INTRADERMAL
  Filled 2018-05-29: qty 10

## 2018-05-29 MED ORDER — ACETAMINOPHEN 500 MG PO TABS
1000.0000 mg | ORAL_TABLET | Freq: Once | ORAL | Status: AC
  Administered 2018-05-29: 1000 mg via ORAL
  Filled 2018-05-29: qty 2

## 2018-05-29 MED ORDER — TETANUS-DIPHTH-ACELL PERTUSSIS 5-2.5-18.5 LF-MCG/0.5 IM SUSP
0.5000 mL | Freq: Once | INTRAMUSCULAR | Status: AC
  Administered 2018-05-29: 0.5 mL via INTRAMUSCULAR
  Filled 2018-05-29: qty 0.5

## 2018-05-29 NOTE — ED Notes (Signed)
Patient able to ambulate independently  

## 2018-05-29 NOTE — ED Notes (Signed)
APP aware of patient's request for pain medication.  This RN offered patient a percocet with estimated time of function.  Patient declined and asked for doctor to come ASAP to assess and order.

## 2018-05-29 NOTE — Discharge Instructions (Addendum)
Contact a health care provider if: °You received a tetanus shot and you have swelling, severe pain, redness, or bleeding at the injection site. °Your pain is not controlled with medicine. °You have more redness, swelling, or pain around the wound. °You have more fluid or blood coming from the wound. °Your wound feels warm to the touch. °You have pus or a bad smell coming from the wound. °You have a fever or chills. °You are nauseous or you vomit. °You are dizzy. °Get help right away if: °You have a red streak going away from your wound. °The edges of the wound open up and separate. °Your wound is bleeding and the bleeding does not stop with gentle pressure. °You have a rash. °You faint. °You have trouble breathing °

## 2018-05-29 NOTE — ED Provider Notes (Signed)
[facility]   CSN: [id number] Arrival date & time: 05/29/18  1734     History   Chief Complaint Chief Complaint  Patient presents with  . Laceration    right thumb    HPI Anna Nguyen is a 32 y.o. female.  Who presents emergency department with chief complaint of injury to the right thumb.  Patient states that she was slicing on a mandolin slicer when she is part of the tip of her finger.  Bleeding has been steady since this occurred about 20 minutes prior to arrival.  She is unsure of her last tetanus vaccination.  She complains of pain in the thumb. Denies numbness.  HPI  Past Medical History:  Diagnosis Date  . Anemia, iron deficiency   . HTN (hypertension)    with last pregnancy  . PCOS (polycystic ovarian syndrome)   . Type II or unspecified type diabetes mellitus without mention of complication, not stated as uncontrolled    not on insulin    Patient Active Problem List   Diagnosis Date Noted  . Indication for care in labor or delivery 07/07/2017  . Major depressive disorder, recurrent severe without psychotic features (HCC) 03/13/2017  . Pregnancy 06/10/2016  . Severe hyperemesis gravidarum 03/25/2016  . Nausea/vomiting in pregnancy 01/16/2016  . Anemia affecting pregnancy in second trimester 01/16/2016  . 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  . Hyperglycemia 08/22/2013  . Vitamin D deficiency 07/15/2013  . PCOS (polycystic ovarian syndrome) 02/28/2013  . B12 deficiency anemia 07/14/2012  . Folate deficiency anemia 07/14/2012  . Contraception management 05/06/2011  . Gastric bypass status for obesity 05/06/2011  . Iron deficiency anemia 10/02/2009    Past Surgical History:  Procedure Laterality Date  . CESAREAN SECTION N/A 07/08/2017   Procedure: CESAREAN SECTION;  Surgeon: Carrington ClampHorvath, Michelle, MD;  [facility];  Service: Obstetrics;  Laterality: N/A;  . LAPAROSCOPIC GASTRIC BANDING      . TONSILLECTOMY    . WISDOM TOOTH EXTRACTION       OB History    Gravida  4   Para  2   Term  2   Preterm      AB  2   Living  2     SAB      TAB  2   Ectopic      Multiple  0   Live Births  2            Home Medications    Prior to Admission medications   Medication Sig Start Date End Date Taking? Authorizing Provider  escitalopram (LEXAPRO) 10 MG tablet Take 10 mg by mouth at bedtime.    [provider]  esomeprazole (NEXIUM) 40 MG capsule Take 40 mg by mouth daily as needed (For heartburn.).    [provider]  hydrOXYzine (ATARAX/VISTARIL) 25 MG tablet Take 1 tablet (25 mg total) by mouth every 6 (six) hours as needed for anxiety. Take 1 tablet as needed every 5 hours for anxiety. Take 2 tablets at bedtime as needed for sleep. Patient taking differently: Take 25-50 mg by mouth 2 (two) times daily as needed for anxiety. Take 1 tablet as needed every 5 hours for anxiety. Take 2 tablets at bedtime as needed for sleep. 03/16/17   Denzil Magnusonhomas, Lashunda, NP  oxyCODONE 10 MG TABS Take 1 tablet (10 mg total) by mouth every 4 (four) hours as needed (pain scale > 7). 07/10/17   Carrington ClampHorvath, Michelle, MD  promethazine (PHENERGAN) 25 MG tablet Take 1 tablet (25 mg total) by mouth every 6 (six) hours as needed for nausea or vomiting. 02/09/17   Rasch, Victorino Dike I, NP  promethazine-dextromethorphan (PROMETHAZINE-DM) 6.25-15 MG/5ML syrup Take 5 mLs by mouth 4 (four) times daily as needed for cough. 11/23/17   Horton, Mayer Masker, MD    Family History Family History  Problem Relation Age of Onset  . Arthritis Other   . Cancer Maternal Aunt        breast  . Diabetes Maternal Uncle   . Hyperlipidemia Maternal Uncle   . Hypertension Maternal Uncle   . Asthma Maternal Grandmother   . Diabetes Maternal Grandmother   . Hyperlipidemia Maternal Grandmother   . Hypertension Maternal Grandmother   . Asthma Maternal Grandfather   . Diabetes Maternal Grandfather   .  Hyperlipidemia Maternal Grandfather   . Hypertension Maternal Grandfather   . Prostate cancer Paternal Grandfather     Social History Social History   Tobacco Use  . Smoking status: Never Smoker  . Smokeless tobacco: Never Used  Substance Use Topics  . Alcohol use: No  . Drug use: No     Allergies   Aspirin; Nsaids; and Latex   Review of Systems Review of Systems Ten systems reviewed and are negative for acute change, except as noted in the HPI.    Physical Exam Updated Vital Signs BP (!) 157/107 (BP Location: Left Arm)   Pulse 90   Temp 98.5 F (36.9 C) (Oral)   Resp 20   SpO2 100%   Physical Exam  Physical Exam  Nursing note and vitals reviewed. Constitutional: She is oriented to person, place, and time. She appears well-developed and well-nourished. No distress.  HENT:  Head: Normocephalic and atraumatic.  Eyes: Conjunctivae normal and EOM are normal. Pupils are equal, round, and reactive to light. No scleral icterus.  Neck: Normal range of motion.  Cardiovascular: Normal rate, regular rhythm and normal heart sounds.  Exam reveals no gallop and no friction rub.   No murmur heard. Pulmonary/Chest: Effort normal and breath sounds normal. No respiratory distress.  Abdominal: Soft. Bowel sounds are normal. She exhibits no distension and no mass. There is no tenderness. There is no guarding.  Neurological: She is alert and oriented to person, place, and time.  Skin: Skin is warm and dry. She is not diaphoretic.  Right thumb with small tissue avulsion injury, active bleeding, no evidence of involvement of the underlying bony structures.   tender to palpation.    ED Treatments / Results  Labs (all labs ordered are listed, but only abnormal results are displayed) Labs Reviewed - No data to display  EKG None  Radiology No results found.  Procedures .Marland KitchenLaceration Repair Date/Time: 05/29/2018 11:54 PM Performed by: Arthor Captain, PA-C Authorized by: Arthor Captain, PA-C   Consent:    Consent obtained:  Verbal   Consent given by:  Patient   Risks discussed:  Pain, poor cosmetic result and infection   Alternatives discussed:  No treatment Anesthesia (see MAR for exact dosages):    Anesthesia method:  Nerve block   Block needle gauge:  27 G   Block anesthetic:  Lidocaine 1% w/o epi   Block injection procedure:  Anatomic landmarks identified Laceration details:    Location:  Finger   Finger location:  R thumb Repair type:    Repair type:  Simple Pre-procedure details:    Preparation:  Patient was prepped and draped in usual sterile fashion  Exploration:    Hemostasis achieved with:  Cautery Treatment:    Area cleansed with:  Betadine and saline   Amount of cleaning:  Standard   Irrigation solution:  Sterile saline   Irrigation method:  Pressure wash Post-procedure details:    Dressing:  Bulky dressing   Patient tolerance of procedure:  Tolerated well, no immediate complications   (including critical care time)  Medications Ordered in ED Medications  lidocaine (PF) (XYLOCAINE) 1 % injection 10 mL (10 mLs Intradermal Given 05/29/18 1851)     Initial Impression / Assessment and Plan / ED Course  I have reviewed the triage vital signs and the nursing notes.  Pertinent labs & imaging results that were available during my care of the patient were reviewed by me and considered in my medical decision making (see chart for details).     Lyan Lad is a 32 y.o. female who presents to ED for avulsion of R thumb. Wound thoroughly cleaned in ED today. Wound explored and bottom of wound seen in a bloodless field. Laceration repaired as dictated above. Patient counseled on home wound care.. Patient was urged to return to the Emergency Department for worsening pain, swelling, expanding erythema especially if it streaks away from the affected area, fever, or for any additional concerns. Patient verbalized understanding. All questions  answered.   Final Clinical Impressions(s) / ED Diagnoses   Final diagnoses:  Avulsion of finger tip, initial encounter    ED Discharge Orders    None       Arthor Captain, PA-C 05/30/18 1510    Arby Barrette, MD 05/30/18 1816

## 2018-05-29 NOTE — ED Triage Notes (Signed)
Pt cut her right thumb with a knife while cutting potatoes. Last tetanus unknown. Bleeding noted to paper towel she has a piece of her finger tissue in a bag, on ice.

## 2018-05-31 ENCOUNTER — Ambulatory Visit: Payer: Self-pay | Admitting: Family

## 2018-05-31 ENCOUNTER — Ambulatory Visit (INDEPENDENT_AMBULATORY_CARE_PROVIDER_SITE_OTHER)
Admission: RE | Admit: 2018-05-31 | Discharge: 2018-05-31 | Disposition: A | Discharge status: Home / Self Care | Payer: 59 | Source: Ambulatory Visit | Attending: Internal Medicine | Admitting: Internal Medicine

## 2018-05-31 ENCOUNTER — Ambulatory Visit: Payer: 59 | Admitting: Internal Medicine

## 2018-05-31 ENCOUNTER — Encounter: Payer: Self-pay | Admitting: Internal Medicine

## 2018-05-31 VITALS — BP 150/100 | HR 89 | Temp 98.3°F | Ht 69.0 in | Wt 261.0 lb

## 2018-05-31 DIAGNOSIS — S61011A Laceration without foreign body of right thumb without damage to nail, initial encounter: Secondary | ICD-10-CM | POA: Diagnosis not present

## 2018-05-31 DIAGNOSIS — D510 Vitamin B12 deficiency anemia due to intrinsic factor deficiency: Secondary | ICD-10-CM | POA: Diagnosis not present

## 2018-05-31 DIAGNOSIS — T148XXA Other injury of unspecified body region, initial encounter: Secondary | ICD-10-CM

## 2018-05-31 DIAGNOSIS — L089 Local infection of the skin and subcutaneous tissue, unspecified: Secondary | ICD-10-CM | POA: Diagnosis not present

## 2018-05-31 DIAGNOSIS — D520 Dietary folate deficiency anemia: Secondary | ICD-10-CM | POA: Diagnosis not present

## 2018-05-31 DIAGNOSIS — S6991XA Unspecified injury of right wrist, hand and finger(s), initial encounter: Secondary | ICD-10-CM

## 2018-05-31 DIAGNOSIS — S6991XS Unspecified injury of right wrist, hand and finger(s), sequela: Secondary | ICD-10-CM

## 2018-05-31 DIAGNOSIS — I10 Essential (primary) hypertension: Secondary | ICD-10-CM

## 2018-05-31 DIAGNOSIS — D508 Other iron deficiency anemias: Secondary | ICD-10-CM | POA: Diagnosis not present

## 2018-05-31 DIAGNOSIS — Z9884 Bariatric surgery status: Secondary | ICD-10-CM | POA: Diagnosis not present

## 2018-05-31 DIAGNOSIS — E559 Vitamin D deficiency, unspecified: Secondary | ICD-10-CM

## 2018-05-31 MED ORDER — AMOXICILLIN-POT CLAVULANATE 875-125 MG PO TABS: 1.0000 | ORAL_TABLET | Freq: Two times a day (BID) | ORAL | 0 refills | Status: AC

## 2018-05-31 MED ORDER — HYDROCODONE-ACETAMINOPHEN 5-325 MG PO TABS: 1.0000 | ORAL_TABLET | Freq: Four times a day (QID) | ORAL | 0 refills | Status: AC | PRN

## 2018-05-31 NOTE — Progress Notes (Signed)
Subjective:  Patient ID: Anna Nguyen, female    DOB: [birth year]  Age: 32 y.o. MRN: [id number]  CC: Hypertension; Anemia; and Wound Check   HPI [employer] presents for concerns about her right thumb.  She was seen in the ED 2 days ago after amputating the tip of her thumb using a slicer.  She received local wound care and it was recommended that she take Tylenol for the pain.  She comes in today crying stating that the pain is unbearable.  She describes a worsening burning, throbbing pain in the tip of her thumb and extending into the proximal thumb.  She has also noticed some redness and swelling.  The scab at the injury site is intact and there has been no drainage.  She denies numbness, weakness, tingling in her thumb.  She has a history of hypertension and is not currently taking an antihypertensive.  She complains of headaches recently but denies chest pain, shortness of breath, edema, or palpitations.  She also has a history of multiple vitamin deficiencies status post bariatric surgery.  She tells me she is currently taking a multivitamin but no specific vitamin supplements.  Outpatient Medications Prior to Visit  Medication Sig Dispense Refill  . escitalopram (LEXAPRO) 10 MG tablet Take 10 mg by mouth at bedtime.    Marland Kitchen zolpidem (AMBIEN) 5 MG tablet zolpidem 5 mg tablet    . esomeprazole (NEXIUM) 40 MG capsule Take 40 mg by mouth daily as needed (For heartburn.).    Marland Kitchen hydrOXYzine (ATARAX/VISTARIL) 25 MG tablet Take 1 tablet (25 mg total) by mouth every 6 (six) hours as needed for anxiety. Take 1 tablet as needed every 5 hours for anxiety. Take 2 tablets at bedtime as needed for sleep. (Patient taking differently: Take 25-50 mg by mouth 2 (two) times daily as needed for anxiety. Take 1 tablet as needed every 5 hours for anxiety. Take 2 tablets at bedtime as needed for sleep.) 60 tablet 0  . oxyCODONE 10 MG TABS Take 1 tablet (10 mg total) by mouth every 4 (four) hours as needed (pain  scale > 7). 30 tablet 0  . promethazine (PHENERGAN) 25 MG tablet Take 1 tablet (25 mg total) by mouth every 6 (six) hours as needed for nausea or vomiting. 30 tablet 0  . promethazine-dextromethorphan (PROMETHAZINE-DM) 6.25-15 MG/5ML syrup Take 5 mLs by mouth 4 (four) times daily as needed for cough. 118 mL 0   No facility-administered medications prior to visit.     ROS Review of Systems  Constitutional: Negative for appetite change, diaphoresis, fatigue and fever.  HENT: Negative.   Eyes: Negative for visual disturbance.  Respiratory: Negative for cough, chest tightness, shortness of breath and wheezing.   Cardiovascular: Negative for chest pain, palpitations and leg swelling.  Gastrointestinal: Negative for abdominal pain, blood in stool, constipation, diarrhea, nausea and vomiting.  Endocrine: Negative.   Genitourinary: Negative.  Negative for difficulty urinating and dysuria.  Musculoskeletal: Negative.  Negative for arthralgias and myalgias.  Skin: Positive for color change and wound.  Neurological: Positive for headaches. Negative for dizziness and weakness.  Hematological: Negative for adenopathy. Does not bruise/bleed easily.  Psychiatric/Behavioral: Negative.     Objective:  BP (!) 150/100 (BP Location: Left Arm, Patient Position: Sitting, Cuff Size: Large)   Pulse 89   Temp 98.3 F (36.8 C) (Oral)   Ht 5\' 9"  (1.753 m)   Wt 261 lb (118.4 kg)   LMP 05/17/2018   SpO2 98%   BMI 38.54  kg/m   BP Readings from Last 3 Encounters:  05/31/18 (!) 150/100  05/29/18 (!) 138/98  11/23/17 126/85    Wt Readings from Last 3 Encounters:  05/31/18 261 lb (118.4 kg)  11/23/17 240 lb (108.9 kg)  07/07/17 247 lb 0.6 oz (112.1 kg)    Physical Exam  Constitutional: She is oriented to person, place, and time. No distress.  HENT:  Mouth/Throat: Oropharynx is clear and moist. No oropharyngeal exudate.  Eyes: Conjunctivae are normal. No scleral icterus.  Neck: Normal range of  motion. Neck supple. No JVD present. No thyromegaly present.  Cardiovascular: Normal rate, regular rhythm and normal heart sounds. Exam reveals no gallop.  No murmur heard. Pulmonary/Chest: Effort normal and breath sounds normal. No respiratory distress. She has no wheezes. She has no rales.  Abdominal: Soft. Normal appearance and bowel sounds are normal. She exhibits no mass. There is no hepatosplenomegaly. There is no tenderness.  Musculoskeletal: Normal range of motion. She exhibits no edema, tenderness or deformity.       Right hand: She exhibits laceration. She exhibits normal range of motion, no bony tenderness and normal capillary refill. Normal sensation noted. Normal strength noted.       Hands: The tip of the right thumb.  Radial aspect.  There is a superficial amputation covered with a scab.  Proximal to this streaking down the radial side of the thumb is a faint area of erythema and warmth.  There is no exudate, induration, or fluctuance.  Nailbed and the nail plate are intact.  Sensation and capillary refill is intact.  Lymphadenopathy:    She has no cervical adenopathy.  Neurological: She is alert and oriented to person, place, and time.  Skin: Skin is warm and dry. She is not diaphoretic. No pallor.  Psychiatric: She has a normal mood and affect. Her behavior is normal. Judgment and thought content normal.  Vitals reviewed.   Lab Results  Component Value Date   WBC 9.1 07/09/2017   HGB 8.7 (L) 07/09/2017   HCT 25.3 (L) 07/09/2017   PLT 110 (L) 07/09/2017   GLUCOSE 117 (H) 03/18/2017   CHOL 149 07/13/2012   TRIG 74.0 07/13/2012   HDL 75.90 07/13/2012   LDLCALC 58 07/13/2012   ALT 9 (L) 03/18/2017   AST 12 (L) 03/18/2017   NA 133 (L) 03/18/2017   K 3.3 (L) 03/18/2017   CL 103 03/18/2017   CREATININE 0.57 03/18/2017   BUN 7 03/18/2017   CO2 22 03/18/2017   TSH 0.993 03/25/2016   HGBA1C 5.0 03/25/2016   MICROALBUR 1.13 10/03/2008    Dg Finger Thumb Right  Result  Date: 05/31/2018 CLINICAL DATA:  Status post laceration of right thumb. EXAM: RIGHT THUMB 2+V COMPARISON:  None. FINDINGS: There is no evidence of fracture or dislocation. There is no evidence of arthropathy or other focal bone abnormality. Status post amputation of soft tissues of distal tuft of right thumb. There is dense material in the wound most consistent with applied ointment. IMPRESSION: Status post amputation of soft tissues of distal tuft of right thumb. No fracture or bony involvement is noted. However, dense material is noted in the wound most consistent with applied ointment. Clinical correlation is recommended. Electronically Signed   By: Lupita Raider, M.D.   On: 05/31/2018 09:39    Assessment & Plan:   Laquonda was seen today for hypertension, anemia and wound check.  Diagnoses and all orders for this visit:  Vitamin B12 deficiency anemia due to  intrinsic factor deficiency- I will recheck her H&H and will monitor her vitamin B12 level. -     CBC with Differential/Platelet; Future -     Folate; Future -     Vitamin B12; Future  Dietary folate deficiency anemia- Will monitor her folate level. -     CBC with Differential/Platelet; Future  Other iron deficiency anemia- Will monitor her iron level. -     CBC with Differential/Platelet; Future -     IBC panel; Future -     Ferritin; Future  Gastric bypass status for obesity  Vitamin D deficiency- I will monitor her vitamin D level. -     VITAMIN D 25 Hydroxy (Vit-D Deficiency, Fractures); Future  Essential hypertension-her blood pressure is not adequately well controlled.  She was seen 3 days ago and lab work was ordered.  At the time of this dictation she has not completed the lab work.  I am not comfortable starting an antihypertensive until I see what her potassium level is.  We have called her twice this week asking her to come in and do the lab work.  As soon as she completes the lab work I will start an  antihypertensive. -     Basic metabolic panel; Future -     TSH; Future -     Urinalysis, Routine w reflex microscopic; Future -     Urine drugs of abuse scrn w alc, routine (Ref Lab); Future -     hCG, quantitative, pregnancy; Future  Thumb injury, right, sequela-she appears to have developed a wound infection.  Plain films are negative for free air, foreign body, or fracture. -     DG Finger Thumb Right; Future -     HYDROcodone-acetaminophen (NORCO/VICODIN) 5-325 MG tablet; Take 1 tablet by mouth every 6 (six) hours as needed for up to 7 days for moderate pain.  Posttraumatic wound infection- I will treat the infection with Augmentin and have offered hydrocodone/APA as needed for pain. -     DG Finger Thumb Right; Future -     amoxicillin-clavulanate (AUGMENTIN) 875-125 MG tablet; Take 1 tablet by mouth 2 (two) times daily for 10 days. -     HYDROcodone-acetaminophen (NORCO/VICODIN) 5-325 MG tablet; Take 1 tablet by mouth every 6 (six) hours as needed for up to 7 days for moderate pain.  Wound infection -     HYDROcodone-acetaminophen (NORCO/VICODIN) 5-325 MG tablet; Take 1 tablet by mouth every 6 (six) hours as needed for up to 7 days for moderate pain.   I have discontinued Mahalia Seda's promethazine, hydrOXYzine, esomeprazole, Oxycodone HCl, and promethazine-dextromethorphan. I am also having her start on amoxicillin-clavulanate and HYDROcodone-acetaminophen. Additionally, I am having her maintain her escitalopram and zolpidem.  Meds ordered this encounter  Medications  . amoxicillin-clavulanate (AUGMENTIN) 875-125 MG tablet    Sig: Take 1 tablet by mouth 2 (two) times daily for 10 days.    Dispense:  20 tablet    Refill:  0  . HYDROcodone-acetaminophen (NORCO/VICODIN) 5-325 MG tablet    Sig: Take 1 tablet by mouth every 6 (six) hours as needed for up to 7 days for moderate pain.    Dispense:  25 tablet    Refill:  0     Follow-up: Return in about 2 weeks (around  06/14/2018).  Sanda Lingerhomas Kaziah Krizek, MD

## 2018-05-31 NOTE — Patient Instructions (Signed)
Wound Infection A wound infection happens when germs start to grow in the wound. Germs that cause wound infections are most commonly bacteria. Other types of infections can occur as well. In some cases, infection can cause the wound to break open. Wound infections need treatment. If a wound infection is left untreated, complications can occur. This may include an infection in your bloodstream (sepsis) or in a bone (osteomyelitis). What are the causes? This condition is caused by germs growing in the wound. What increases the risk? The following factors may make you more likely to develop this condition:  Having a weak body defense system (immune system).  Having diabetes.  Taking steroid medicines for a long time (chronic use).  Smoking.  Older age.  Being overweight.  What are the signs or symptoms? Symptoms of this condition include:  Having more redness, swelling, or pain at the wound site.  Having more blood, pus, or fluid at the wound site.  A bad smell coming from a wound or bandage (dressing).  Having a fever.  Feeling tired or fatigued.  How is this diagnosed? This condition is diagnosed with a medical history and physical exam. You may also have blood tests. How is this treated? This condition is treated with an antibiotic medicine. The infection should improve 24-48 hours after you start antibiotics. Any redness around the wound should stop spreading, and the wound should be less painful. Follow these instructions at home: Medicines  Take or apply over-the-counter and prescription medicines only as told by your health care provider.  If you were prescribed antibiotic medicine, take or apply it as told by your health care provider. Do not stop using the antibiotic even if your condition improves. Wound care  Clean the wound each day or as told by your health care provider. ? Wash the wound with mild soap and water. ? Rinse the wound with water to remove all  soap. ? Pat the wound dry with a clean towel. Do not rub it.  Follow instructions from your health care provider about how to take care of your wound. Make sure you: ? Wash your hands with soap and water before you change your dressing. If soap and water are not available, use hand sanitizer. ? Change your dressing as told by your health care provider. ? Leave stitches (sutures), skin glue, or adhesive strips in place, if this applies. These skin closures may need to stay in place for 2 weeks or longer. If adhesive strip edges start to loosen and curl up, you may trim the loose edges. Do not remove adhesive strips completely unless your health care provider tells you to do that. Some wounds are left open to heal on their own.  Check your wound every day for signs of infection. Watch for: ? More redness, swelling, or pain. ? More fluid or blood. ? Warmth. ? Pus or a bad smell. General instructions  Keep the dressing dry until your health care provider says it can be removed.  Do not take baths, swim, use a hot tub, or do anything that would put your wound underwater until your health care provider approves.  Raise (elevate) the injured area above the level of your heart while you are sitting or lying down.  Do not scratch or pick at the wound.  Keep all follow-up visits as told by your health care provider. This is important. Contact a health care provider if:  Your pain is not controlled with medicine.  You have more   redness, swelling, or pain around your wound.  You have more fluid or blood coming from your wound.  Your wound feels warm to the touch.  You have pus coming from your wound.  You continue to notice a bad smell coming from your wound or your dressing.  Your wound that was closed breaks open. Get help right away if:  You have a red streak going away from your wound.  You have a fever. This information is not intended to replace advice given to you by your  health care provider. Make sure you discuss any questions you have with your health care provider. Document Released: 05/24/2003 Document Revised: 02/06/2016 Document Reviewed: 02/12/2015 Elsevier Interactive Patient Education  2018 [employer]  

## 2018-06-22 ENCOUNTER — Encounter: Payer: Self-pay | Admitting: Internal Medicine

## 2018-06-22 ENCOUNTER — Other Ambulatory Visit (INDEPENDENT_AMBULATORY_CARE_PROVIDER_SITE_OTHER): Payer: 59

## 2018-06-22 ENCOUNTER — Ambulatory Visit: Payer: 59 | Admitting: Internal Medicine

## 2018-06-22 VITALS — BP 144/100 | HR 83 | Temp 98.1°F | Resp 16 | Ht 69.0 in | Wt 255.0 lb

## 2018-06-22 DIAGNOSIS — T148XXA Other injury of unspecified body region, initial encounter: Secondary | ICD-10-CM

## 2018-06-22 DIAGNOSIS — D520 Dietary folate deficiency anemia: Secondary | ICD-10-CM | POA: Diagnosis not present

## 2018-06-22 DIAGNOSIS — D510 Vitamin B12 deficiency anemia due to intrinsic factor deficiency: Secondary | ICD-10-CM

## 2018-06-22 DIAGNOSIS — I10 Essential (primary) hypertension: Secondary | ICD-10-CM

## 2018-06-22 DIAGNOSIS — E559 Vitamin D deficiency, unspecified: Secondary | ICD-10-CM | POA: Diagnosis not present

## 2018-06-22 DIAGNOSIS — D508 Other iron deficiency anemias: Secondary | ICD-10-CM

## 2018-06-22 DIAGNOSIS — Z23 Encounter for immunization: Secondary | ICD-10-CM | POA: Diagnosis not present

## 2018-06-22 DIAGNOSIS — L089 Local infection of the skin and subcutaneous tissue, unspecified: Secondary | ICD-10-CM

## 2018-06-22 LAB — URINALYSIS, ROUTINE W REFLEX MICROSCOPIC
Bilirubin Urine: NEGATIVE
Hgb urine dipstick: NEGATIVE
Ketones, ur: NEGATIVE
Leukocytes, UA: NEGATIVE
Nitrite: NEGATIVE
RBC / HPF: NONE SEEN (ref 0–?)
SPECIFIC GRAVITY, URINE: 1.01 (ref 1.000–1.030)
TOTAL PROTEIN, URINE-UPE24: NEGATIVE
URINE GLUCOSE: NEGATIVE
Urobilinogen, UA: 0.2 (ref 0.0–1.0)
pH: 6 (ref 5.0–8.0)

## 2018-06-22 LAB — BASIC METABOLIC PANEL
BUN: 9 mg/dL (ref 6–23)
CHLORIDE: 106 meq/L (ref 96–112)
CO2: 27 meq/L (ref 19–32)
CREATININE: 0.78 mg/dL (ref 0.40–1.20)
Calcium: 9.1 mg/dL (ref 8.4–10.5)
GFR: 109.77 mL/min (ref >60.00)
Glucose, Bld: 103 mg/dL — ABNORMAL HIGH (ref 70–99)
Potassium: 4 mEq/L (ref 3.5–5.1)
Sodium: 140 mEq/L (ref 135–145)

## 2018-06-22 LAB — CBC WITH DIFFERENTIAL/PLATELET
Basophils Absolute: 0 10*3/uL (ref 0.0–0.1)
Basophils Relative: 0.2 % (ref 0.0–3.0)
EOS ABS: 0.1 10*3/uL (ref 0.0–0.7)
Eosinophils Relative: 1.4 % (ref 0.0–5.0)
HEMATOCRIT: 34 % — AB (ref 36.0–46.0)
Hemoglobin: 11.4 g/dL — ABNORMAL LOW (ref 12.0–15.0)
LYMPHS PCT: 43.2 % (ref 12.0–46.0)
Lymphs Abs: 2.4 10*3/uL (ref 0.7–4.0)
MCHC: 33.5 g/dL (ref 30.0–36.0)
MCV: 86.2 fl (ref 78.0–100.0)
Monocytes Absolute: 0.3 10*3/uL (ref 0.1–1.0)
Monocytes Relative: 5.4 % (ref 3.0–12.0)
Neutro Abs: 2.7 10*3/uL (ref 1.4–7.7)
Neutrophils Relative %: 49.8 % (ref 43.0–77.0)
PLATELETS: 200 10*3/uL (ref 150.0–400.0)
RBC: 3.94 Mil/uL (ref 3.87–5.11)
RDW: 14.1 % (ref 11.5–15.5)
WBC: 5.5 10*3/uL (ref 4.0–10.5)

## 2018-06-22 LAB — IBC PANEL
IRON: 58 ug/dL (ref 42–145)
SATURATION RATIOS: 17.2 % — AB (ref 20.0–50.0)
Transferrin: 241 mg/dL (ref 212.0–360.0)

## 2018-06-22 LAB — TSH: TSH: 0.73 u[IU]/mL (ref 0.35–4.50)

## 2018-06-22 LAB — VITAMIN B12: Vitamin B-12: 85 pg/mL — ABNORMAL LOW (ref 211–911)

## 2018-06-22 LAB — FOLATE: FOLATE: 10.6 ng/mL (ref >5.9)

## 2018-06-22 LAB — VITAMIN D 25 HYDROXY (VIT D DEFICIENCY, FRACTURES): VITD: 15.11 ng/mL — ABNORMAL LOW (ref 30.00–100.00)

## 2018-06-22 LAB — HCG, QUANTITATIVE, PREGNANCY: Quantitative HCG: 0.37 m[IU]/mL

## 2018-06-22 LAB — FERRITIN: Ferritin: 43.7 ng/mL (ref 10.0–291.0)

## 2018-06-22 MED ORDER — CHOLECALCIFEROL 1.25 MG (50000 UT) PO CAPS: 50000.0000 [IU] | ORAL_CAPSULE | ORAL | 1 refills | Status: DC

## 2018-06-22 MED ORDER — TRIAMTERENE-HCTZ 37.5-25 MG PO CAPS: 1.0000 | ORAL_CAPSULE | Freq: Every day | ORAL | 0 refills | Status: DC

## 2018-06-22 MED ORDER — CYANOCOBALAMIN 1000 MCG/ML IJ SOLN
1000.0000 ug | Freq: Once | INTRAMUSCULAR | Status: AC
  Administered 2018-06-22: 1000 ug via INTRAMUSCULAR

## 2018-06-22 NOTE — Patient Instructions (Signed)

## 2018-06-22 NOTE — Progress Notes (Signed)
Subjective:  Patient ID: Anna Nguyen, female    DOB: 03/29/1986  Age: 32 y.o. MRN: [id number]  CC: Hypertension; Anemia; and Wound Check   HPI Anna Nguyen presents for f/up -  1.  She remains concerned about her right thumb.  Keeps it covered with a dressing 24/7.  The pain has resolved and she denies any recent redness, swelling, drainage, or paresthesias.  2.  She is concerned that her blood pressure is not well controlled but she has felt well recently.  She denies headache/blurred vision/chest pain/shortness of breath/edema/fatigue.  Outpatient Medications Prior to Visit  Medication Sig Dispense Refill  . escitalopram (LEXAPRO) 10 MG tablet Take 10 mg by mouth at bedtime.    Marland Kitchen zolpidem (AMBIEN) 5 MG tablet zolpidem 5 mg tablet     No facility-administered medications prior to visit.     ROS Review of Systems  Constitutional: Negative for chills, diaphoresis, fatigue and fever.  HENT: Negative.   Eyes: Negative for visual disturbance.  Respiratory: Negative for apnea, cough, chest tightness, shortness of breath and wheezing.   Cardiovascular: Negative for chest pain, palpitations and leg swelling.  Gastrointestinal: Negative for abdominal pain, constipation, diarrhea, nausea and vomiting.  Genitourinary: Negative.  Negative for difficulty urinating and hematuria.  Musculoskeletal: Negative.  Negative for arthralgias, back pain, myalgias and neck pain.  Skin: Positive for wound. Negative for color change.  Neurological: Negative.  Negative for dizziness, weakness, light-headedness and headaches.  Hematological: Negative for adenopathy. Does not bruise/bleed easily.  Psychiatric/Behavioral: Negative.     Objective:  BP (!) 144/100 (BP Location: Left Arm, Patient Position: Sitting, Cuff Size: Large)   Pulse 83   Temp 98.1 F (36.7 C) (Oral)   Resp 16   Ht 5\' 9"  (1.753 m)   Wt 255 lb (115.7 kg)   LMP 05/16/2018 (Within Weeks)   SpO2 98%   Breastfeeding? No   BMI  37.66 kg/m   BP Readings from Last 3 Encounters:  06/22/18 (!) 144/100  05/31/18 (!) 150/100  05/29/18 (!) 138/98    Wt Readings from Last 3 Encounters:  06/22/18 255 lb (115.7 kg)  05/31/18 261 lb (118.4 kg)  11/23/17 240 lb (108.9 kg)    Physical Exam  Constitutional: She is oriented to person, place, and time. No distress.  HENT:  Mouth/Throat: Oropharynx is clear and moist. No oropharyngeal exudate.  Eyes: Conjunctivae are normal. No scleral icterus.  Neck: Normal range of motion. Neck supple. No JVD present. No thyromegaly present.  Cardiovascular: Normal rate, regular rhythm and normal heart sounds. Exam reveals no gallop and no friction rub.  No murmur heard. Pulmonary/Chest: Effort normal and breath sounds normal. No respiratory distress. She has no wheezes. She has no rales.  Abdominal: Soft. Bowel sounds are normal. She exhibits no mass. There is no hepatosplenomegaly. There is no tenderness.  Musculoskeletal: Normal range of motion. She exhibits no edema, tenderness or deformity.  Right thumb -  the tip of the radial side shows a 1 cm wound that is filled with granulation tissue.  There is superficial gauze stuck on top of the granulation tissue that was easily removed.  There is no erythema, exudate, induration, fluctuance, or warmth.  Lymphadenopathy:    She has no cervical adenopathy.  Neurological: She is alert and oriented to person, place, and time.  Skin: Skin is warm and dry. She is not diaphoretic. No pallor.  Vitals reviewed.   Lab Results  Component Value Date   WBC 5.5 06/22/2018  HGB 11.4 (L) 06/22/2018   HCT 34.0 (L) 06/22/2018   PLT 200.0 06/22/2018   GLUCOSE 103 (H) 06/22/2018   CHOL 149 07/13/2012   TRIG 74.0 07/13/2012   HDL 75.90 07/13/2012   LDLCALC 58 07/13/2012   ALT 9 (L) 03/18/2017   AST 12 (L) 03/18/2017   NA 140 06/22/2018   K 4.0 06/22/2018   CL 106 06/22/2018   CREATININE 0.78 06/22/2018   BUN 9 06/22/2018   CO2 27 06/22/2018    TSH 0.73 06/22/2018   HGBA1C 5.0 03/25/2016   MICROALBUR 1.13 10/03/2008    Dg Finger Thumb Right  Result Date: 05/31/2018 CLINICAL DATA:  Status post laceration of right thumb. EXAM: RIGHT THUMB 2+V COMPARISON:  None. FINDINGS: There is no evidence of fracture or dislocation. There is no evidence of arthropathy or other focal bone abnormality. Status post amputation of soft tissues of distal tuft of right thumb. There is dense material in the wound most consistent with applied ointment. IMPRESSION: Status post amputation of soft tissues of distal tuft of right thumb. No fracture or bony involvement is noted. However, dense material is noted in the wound most consistent with applied ointment. Clinical correlation is recommended. Electronically Signed   By: Lupita Raider, M.D.   On: 05/31/2018 09:39    Assessment & Plan:   Wandy was seen today for hypertension, anemia and wound check.  Diagnoses and all orders for this visit:  Essential hypertension- Her blood pressure is not well controlled.  I will treat the vitamin D deficiency.  I have also asked her to start the combination of triamterene and hydrochlorothiazide. -     Cancel: Urine Drug Screen w/Alc, no confirm -     Cancel: Urine Drug Screen w/Alc, no confirm; Future -     Urine Drug Screen w/Alc, no confirm; Future -     triamterene-hydrochlorothiazide (DYAZIDE) 37.5-25 MG capsule; Take 1 each (1 capsule total) by mouth daily.  Need for influenza vaccination -     Flu Vaccine QUAD 36+ mos IM  Posttraumatic wound infection- The wound is granulating nicely.  I think the failure to heal more quickly was caused by the retention of gauze that she has elected not to remove.  I have asked her to keep the wound covered only if absolutely necessary but for now I think it would benefit from being exposed to some oxygen.  She will let me know if the wound does not heal over the next week or 2.  If it does not then I will refer to her  hand surgery to see if she needs to consider a skin graft.  Dietary folate deficiency anemia  Vitamin B12 deficiency anemia due to intrinsic factor deficiency- Her B12 level is less than 100.  She will start B12 replacement therapy. -     cyanocobalamin ((VITAMIN B-12)) injection 1,000 mcg  Other iron deficiency anemia- Her iron level is normal.  Vitamin D deficiency -     Cholecalciferol 50000 units capsule; Take 1 capsule (50,000 Units total) by mouth once a week.   I am having Anna Nguyen start on Cholecalciferol and triamterene-hydrochlorothiazide. I am also having her maintain her escitalopram and zolpidem. We administered cyanocobalamin.  Meds ordered this encounter  Medications  . cyanocobalamin ((VITAMIN B-12)) injection 1,000 mcg  . Cholecalciferol 50000 units capsule    Sig: Take 1 capsule (50,000 Units total) by mouth once a week.    Dispense:  12 capsule    Refill:  1  . triamterene-hydrochlorothiazide (DYAZIDE) 37.5-25 MG capsule    Sig: Take 1 each (1 capsule total) by mouth daily.    Dispense:  90 capsule    Refill:  0     Follow-up: No follow-ups on file.  Sanda Linger, MD

## 2018-06-22 NOTE — Addendum Note (Signed)
Addended by: Trellis Paganini D on: 06/22/2018 10:20 AM   Modules accepted: Orders

## 2018-06-23 LAB — DRUG SCREEN URINE W/ALC, NO CONF
ALCOHOL, ETHYL (U): NEGATIVE
AMPHETAMINES (1000 ng/mL SCRN): NEGATIVE
BARBITURATES: NEGATIVE
BENZODIAZEPINES: NEGATIVE
COCAINE METABOLITES: NEGATIVE
MARIJUANA MET (50 ng/mL SCRN): NEGATIVE
METHADONE: NEGATIVE
METHAQUALONE: NEGATIVE
OPIATES: NEGATIVE
PHENCYCLIDINE: NEGATIVE
PROPOXYPHENE: NEGATIVE

## 2018-06-28 ENCOUNTER — Encounter (HOSPITAL_COMMUNITY): Payer: Self-pay

## 2018-12-09 ENCOUNTER — Encounter (HOSPITAL_COMMUNITY): Payer: Self-pay | Admitting: Emergency Medicine

## 2018-12-09 ENCOUNTER — Ambulatory Visit (HOSPITAL_COMMUNITY)
Admission: EM | Admit: 2018-12-09 | Discharge: 2018-12-09 | Disposition: A | Discharge status: Home / Self Care | Payer: 59 | Attending: Family Medicine | Admitting: Family Medicine

## 2018-12-09 ENCOUNTER — Other Ambulatory Visit: Payer: Self-pay

## 2018-12-09 ENCOUNTER — Ambulatory Visit: Payer: Self-pay

## 2018-12-09 DIAGNOSIS — K529 Noninfective gastroenteritis and colitis, unspecified: Secondary | ICD-10-CM

## 2018-12-09 DIAGNOSIS — Z3202 Encounter for pregnancy test, result negative: Secondary | ICD-10-CM | POA: Diagnosis not present

## 2018-12-09 LAB — POCT URINALYSIS DIP (DEVICE)
Bilirubin Urine: NEGATIVE
Glucose, UA: NEGATIVE mg/dL
Hgb urine dipstick: NEGATIVE
Ketones, ur: NEGATIVE mg/dL
Leukocytes,Ua: NEGATIVE
Nitrite: NEGATIVE
Protein, ur: NEGATIVE mg/dL
Specific Gravity, Urine: >=1.03 (ref 1.005–1.030)
Urobilinogen, UA: 0.2 mg/dL (ref 0.0–1.0)
pH: 5.5 (ref 5.0–8.0)

## 2018-12-09 LAB — POCT PREGNANCY, URINE: Preg Test, Ur: NEGATIVE

## 2018-12-09 MED ORDER — ONDANSETRON 4 MG PO TBDP: 4.0000 mg | ORAL_TABLET | Freq: Three times a day (TID) | ORAL | 0 refills | Status: DC | PRN

## 2018-12-09 NOTE — ED Triage Notes (Signed)
Nausea for a week Diarrhea started 2 days ago Vomited this morning  No cough No sob No local or domestic travel.    Child had fever, cough, runny nose recently

## 2018-12-09 NOTE — Discharge Instructions (Signed)

## 2018-12-09 NOTE — Telephone Encounter (Signed)
Incoming call from Patient with a complaint having diarrhea and vomiting. Bile.  Onset was 2 days ago.  Reports that stools are occurring often,  q 2 hours.  Watery color goes from yellow to last night to dark brown this am.  Patient has vomited 2 to 3 times. First time it was orange juice.  Other 2 times was bile , light green in color.  Patient reports starting to feel dizzy and weak.  Does not know of any exposure.  Denies  Antibiotic use.   Denies fever.  Denies fever.  Reviewed Protocol, reccommended Patient go to ED or Urgent Care for further evaluation. Patient states she will go to Urgent Care.  Provided Care Advice.  Voiced  Understanding.  Will be going to Urgent Care.                              Reason for Disposition . Black or tarry bowel movements  (Exception: chronic-unchanged  black-grey bowel movements AND is taking iron pills or Pepto-bismol)  Answer Assessment - Initial Assessment Questions 1. DIARRHEA SEVERITY: "How bad is the diarrhea?" "How many extra stools have you had in the past 24 hours than normal?"    - NO DIARRHEA (SCALE 0)   - MILD (SCALE 1-3): Few loose or mushy BMs; increase of 1-3 stools over normal daily number of stools; mild increase in ostomy output.   -  MODERATE (SCALE 4-7): Increase of 4-6 stools daily over normal; moderate increase in ostomy output. * SEVERE (SCALE 8-10; OR 'WORST POSSIBLE'): Increase of 7 or more stools daily over normal; moderate increase in ostomy output; incontinence.     q every hour2. ONSET: "When did the diarrhea begin?"      2 days ago3. BM CONSISTENCY: "How loose or watery is the diarrhea?"      Watery, yellowis last night dark brown 4. VOMITING: "Are you also vomiting?" If so, ask: "How many times in the past 24 hours?"      3 timw 2 times dry heaving.  5. ABDOMINAL PAIN: "Are you having any abdominal pain?" If yes: "What does it feel like?" (e.g., crampy, dull, intermittent, constant)      crampy dull ache   Lower back lower  stomach 6. ABDOMINAL PAIN SEVERITY: If present, ask: "How bad is the pain?"  (e.g., Scale 1-10; mild, moderate, or severe)   - MILD (1-3): doesn't interfere with normal activities, abdomen soft and not tender to touch    - MODERATE (4-7): interferes with normal activities or awakens from sleep, tender to touch    - SEVERE (8-10): excruciating pain, doubled over, unable to do any normal activities       Medium to severe 7. ORAL INTAKE: If vomiting, "Have you been able to drink liquids?" "How much fluids have you had in the past 24 hours?"     Not this am tried OJ  And sips of water.   8. HYDRATION: "Any signs of dehydration?" (e.g., dry mouth [not just dry lips], too weak to stand, dizziness, new weight loss) "When did you last urinate?"      Getting weak and dizzy ,  9. EXPOSURE: "Have you traveled to a foreign country recently?" "Have you been exposed to anyone with diarrhea?" "Could you have eaten any food that was spoiled?"    Denies 10. ANTIBIOTIC USE: "Are you taking antibiotics now or have you taken antibiotics in the past 2 months?"  Denies 11. OTHER SYMPTOMS: "Do you have any other symptoms?" (e.g., fever, blood in stool)       Denies fever,  Kids had fever 12. PREGNANCY: "Is there any chance you are pregnant?" "When was your last menstrual period?"       Not  Pregnant  Protocols used: [facility]

## 2018-12-09 NOTE — ED Triage Notes (Signed)
Patient has back pain radiating around both sides to the lower abdomen.  This is not like pain preceding a bowel movement.  Pain is intermittent and brings patient to tears

## 2018-12-09 NOTE — ED Notes (Signed)
Patient verbalizes understanding of discharge instructions. Opportunity for questioning and answers were provided. Patient discharged from [facility] by MD. 

## 2018-12-09 NOTE — ED Provider Notes (Signed)
[facility]   [id number] 12/09/18 Arrival Time: 0934  ASSESSMENT & PLAN:  1. Gastroenteritis    Benign abdominal exam. No indication for urgent imaging. No signs of severe dehydration requiring IVF at this time. Tolerating sips of PO fluids here.  Meds ordered this encounter  Medications  . ondansetron (ZOFRAN-ODT) 4 MG disintegrating tablet    Sig: Take 1 tablet (4 mg total) by mouth every 8 (eight) hours as needed for nausea or vomiting.    Dispense:  15 tablet    Refill:  0   Discussed typical duration of symptoms for suspected viral GI illness. Will do her best to ensure adequate fluid intake in order to avoid dehydration. Will proceed to the Emergency Department for evaluation if unable to tolerate PO fluids regularly.  Otherwise she will f/u with her PCP or here if not showing improvement over the next 48-72 hours.  Reviewed expectations re: course of current medical issues. Questions answered. Outlined signs and symptoms indicating need for more acute intervention. Patient verbalized understanding. After Visit Summary given.   SUBJECTIVE: History from: patient.  Anna Nguyen is a 33 y.o. female who reports nausea over the past several days to one week. Non-bilious, non-bloody emesis x 1 this morning. Non-bloody and fairly persistent diarrhea for the past couple of days. Associated bilateral low back discomfort reported that does occasionally radiate to pelvis. Mild and crampy abdominal discomfort. Aggravating factors: have not been identified. Alleviating factors: have not been identified. Diarrhea woke her from sleep last evening. She denies arthralgias, constipation, fever, headache and myalgias. Appetite: decreased. PO intake: decreased. Ambulatory without assistance. Denies: urinary frequency, hematuria and urinary incontinence. Sick contacts with similar symptoms: none known. Recent travel or camping: none. OTC treatment: Reglan with mild help (nausea).   No LMP recorded. (Menstrual status: Irregular Periods).  Past Surgical History:  Procedure Laterality Date  . CESAREAN SECTION N/A 07/08/2017   Procedure: CESAREAN SECTION;  Surgeon: Carrington Clamp, MD;  Location: [facility] BIRTHING SUIT[facility];  Service: Obstetrics;  Laterality: N/A;  . LAPAROSCOPIC GASTRIC BANDING    . TONSILLECTOMY    . WISDOM TOOTH EXTRACTION     ROS: As per HPI. All other systems negative.   OBJECTIVE:  Vitals:   12/09/18 1002  BP: (!) 139/91  Pulse: (!) 102  Resp: 20  Temp: 98.5 F (36.9 C)  TempSrc: Oral  SpO2: 100%    General appearance: alert; no distress Oropharynx: moist Lungs: clear to auscultation bilaterally; unlabored Heart: slight tachycardia; regular rhythm Abdomen: soft; non-distended; no significant abdominal tenderness; reports "a little crampy" epigastric discomfort; bowel sounds present; no masses or organomegaly; no guarding or rebound tenderness Back: no CVA tenderness but does report moderate bilateral lumbar paraspinal tenderness to palpation; FROM at waist Extremities: no edema; symmetrical with no gross deformities Skin: warm; dry Neurologic: normal gait; normal strength and sensation of bilateral lower extremities Psychological: alert and cooperative; normal mood and affect  Labs: Results for orders placed or performed during the hospital encounter of 12/09/18  POCT urinalysis dip (device)  Result Value Ref Range   Glucose, UA NEGATIVE NEGATIVE mg/dL   Bilirubin Urine NEGATIVE NEGATIVE   Ketones, ur NEGATIVE NEGATIVE mg/dL   Specific Gravity, Urine >=1.030 1.005 - 1.030   Hgb urine dipstick NEGATIVE NEGATIVE   pH 5.5 5.0 - 8.0   Protein, ur NEGATIVE NEGATIVE mg/dL   Urobilinogen, UA 0.2 0.0 - 1.0 mg/dL   Nitrite NEGATIVE NEGATIVE   Leukocytes,Ua NEGATIVE NEGATIVE  Pregnancy, urine POC  Result  Value Ref Range   Preg Test, Ur NEGATIVE NEGATIVE    Allergies  Allergen Reactions  . Aspirin Other (See Comments)    Pt cannot  recall reaction  . Nsaids Other (See Comments)    Pt cannot recall reaction  . Latex Rash                                               Past Medical History:  Diagnosis Date  . Anemia, iron deficiency   . HTN (hypertension)    with last pregnancy  . PCOS (polycystic ovarian syndrome)   . Type II or unspecified type diabetes mellitus without mention of complication, not stated as uncontrolled    not on insulin   Social History   Socioeconomic History  . Marital status: Married    Spouse name: Not on file  . Number of children: Not on file  . Years of education: Not on file  . Highest education level: Not on file  Occupational History  . Occupation: Investment banker, corporate: [employer]    Comment: CSR  Social Needs  . Financial resource strain: Not on file  . Food insecurity:    Worry: Not on file    Inability: Not on file  . Transportation needs:    Medical: Not on file    Non-medical: Not on file  Tobacco Use  . Smoking status: Never Smoker  . Smokeless tobacco: Never Used  Substance and Sexual Activity  . Alcohol use: No  . Drug use: No  . Sexual activity: Yes    Partners: Male    Birth control/protection: None, Other-see comments    Comment: husband had vasectomy  Lifestyle  . Physical activity:    Days per week: Not on file    Minutes per session: Not on file  . Stress: Not on file  Relationships  . Social connections:    Talks on phone: Not on file    Gets together: Not on file    Attends religious service: Not on file    Active member of club or organization: Not on file    Attends meetings of clubs or organizations: Not on file    Relationship status: Not on file  . Intimate partner violence:    Fear of current or ex partner: Not on file    Emotionally abused: Not on file    Physically abused: Not on file    Forced sexual activity: Not on file  Other Topics Concern  . Not on file  Social History Narrative   Regular Exercise-yes   Caffeine use:  occasionaly               Family History  Problem Relation Age of Onset  . Arthritis Other   . Cancer Maternal Aunt        breast  . Diabetes Maternal Uncle   . Hyperlipidemia Maternal Uncle   . Hypertension Maternal Uncle   . Asthma Maternal Grandmother   . Diabetes Maternal Grandmother   . Hyperlipidemia Maternal Grandmother   . Hypertension Maternal Grandmother   . Asthma Maternal Grandfather   . Diabetes Maternal Grandfather   . Hyperlipidemia Maternal Grandfather   . Hypertension Maternal Grandfather   . Prostate cancer Paternal Glynda Jaeger, MD 12/20/18 [phone]

## 2019-01-17 ENCOUNTER — Ambulatory Visit: Payer: Self-pay

## 2019-01-17 NOTE — Telephone Encounter (Signed)
Patient called and says she was around her mother early last week on Monday and her mother tested positive for COVID-19 on last Friday. She says her mother had symptoms, but they thought it was sinus related. The patient says she's not having any symptoms and says her husband and children were with her at her mom's house visiting about 20 minutes or a little longer. I called the office and spoke to [place], [facility] who says to let the patient know she will receive a call back with Dr. Yetta Barre recommendations. I advised the patient, she verbalized understanding. I advised self isolation until she hears of the recommendations from Dr. Yetta Barre.   Answer Assessment - Initial Assessment Questions 1. CLOSE CONTACT: "Who is the person with the confirmed or suspected COVID-19 infection that you were exposed to?"     Mother received results on Friday 2. PLACE of CONTACT: "Where were you when you were exposed to COVID-19?" (e.g., home, school, medical waiting room; which city?)     [place], [place] 3. TYPE of CONTACT: "How much contact was there?" (e.g., sitting next to, live in same house, work in same office, same building)     20 minutes or longer on last Monday or Tuesday 4. DURATION of CONTACT: "How long were you in contact with the COVID-19 patient?" (e.g., a few seconds, passed by person, a few minutes, live with the patient)     20 minutes or a little longer at her house visiting 5. DATE of CONTACT: "When did you have contact with a COVID-19 patient?" (e.g., how many days ago)     01/10/19 6. TRAVEL: "Have you traveled out of the country recently?" If so, "When and where?"     * Also ask about out-of-state travel, since the CDC has identified some high risk cities for community spread in the [place].     * Note: Travel becomes less relevant if there is widespread community transmission where the patient lives.     No 7. COMMUNITY SPREAD: "Are there lots of cases of COVID-19 (community spread) where you live?" (See  public health department website, if unsure)   * MAJOR community spread: high number of cases; numbers of cases are increasing; many people hospitalized.   * MINOR community spread: low number of cases; not increasing; few or no people hospitalized     Unsure of neighborhood, but zip code yes  8. SYMPTOMS: "Do you have any symptoms?" (e.g., fever, cough, breathing difficulty)     No 9. PREGNANCY OR POSTPARTUM: "Is there any chance you are pregnant?" "When was your last menstrual period?" "Did you deliver in the last 2 weeks?"     No, LMP week of 4/27 10. HIGH RISK: "Do you have any heart or lung problems? Do you have a weak immune system?" (e.g., CHF, COPD, asthma, HIV positive, chemotherapy, renal failure, diabetes mellitus, sickle cell anemia)       Diabetes, HTN  Protocols used: CORONAVIRUS (COVID-19) EXPOSURE-A-AH

## 2019-01-18 NOTE — Telephone Encounter (Signed)
Informed pt to stay home and manage symptoms if and when they arise. Otherwise to quarantine for the next 14 days. If no sx then great. If she does develop sx then she should go to [facility]

## 2019-03-17 ENCOUNTER — Ambulatory Visit: Payer: Self-pay | Admitting: *Deleted

## 2019-03-17 ENCOUNTER — Encounter: Payer: Self-pay | Admitting: Internal Medicine

## 2019-03-17 ENCOUNTER — Ambulatory Visit (INDEPENDENT_AMBULATORY_CARE_PROVIDER_SITE_OTHER): Payer: 59 | Admitting: Internal Medicine

## 2019-03-17 ENCOUNTER — Other Ambulatory Visit: Payer: Self-pay

## 2019-03-17 ENCOUNTER — Ambulatory Visit: Payer: Self-pay | Admitting: Internal Medicine

## 2019-03-17 ENCOUNTER — Telehealth: Payer: Self-pay | Admitting: *Deleted

## 2019-03-17 ENCOUNTER — Telehealth: Payer: Self-pay

## 2019-03-17 DIAGNOSIS — Z20828 Contact with and (suspected) exposure to other viral communicable diseases: Secondary | ICD-10-CM | POA: Diagnosis not present

## 2019-03-17 DIAGNOSIS — R6889 Other general symptoms and signs: Secondary | ICD-10-CM

## 2019-03-17 DIAGNOSIS — Z20822 Contact with and (suspected) exposure to covid-19: Secondary | ICD-10-CM

## 2019-03-17 MED ORDER — ONDANSETRON 4 MG PO TBDP: 4.0000 mg | ORAL_TABLET | Freq: Three times a day (TID) | ORAL | 0 refills | Status: DC | PRN

## 2019-03-17 MED ORDER — BENZONATATE 200 MG PO CAPS: 200.0000 mg | ORAL_CAPSULE | Freq: Three times a day (TID) | ORAL | 0 refills | Status: DC | PRN

## 2019-03-17 NOTE — Telephone Encounter (Signed)
Unable to finish call- computer disconnected from server. Call finished by another triage nurse.

## 2019-03-17 NOTE — Telephone Encounter (Signed)
Pt reports fever, congestion, fatigue, body aches, chills, sore throat. States vomiting Sunday, not since. Reports mild diarrhea. Temp Monday 100.0 has not checked since. Denies SOB, states "Nose stuffy and feels burning at times."  Pts husband is police officer and concerned about exposure. Husband asymptomatic. States children were ill first, prior to her symptoms. [place] attempted to reach office, unable to reach, recording. Pts email and ph# verified.  Please advise regarding appt/ testing referral.  PtsCB# [phone]  Reason for Disposition . [1] COVID-19 infection suspected by caller or triager AND [2] mild symptoms (cough, fever, or others) AND [3] no complications or SOB  Answer Assessment - Initial Assessment Questions 1. COVID-19 DIAGNOSIS: "Who made your Coronavirus (COVID-19) diagnosis?" "Was it confirmed by a positive lab test?" If not diagnosed by a HCP, ask "Are there lots of cases (community spread) where you live?" (See public health department website, if unsure)     NA 2. ONSET: "When did the COVID-19 symptoms start?"      Sunday 3. WORST SYMPTOM: "What is your worst symptom?" (e.g., cough, fever, shortness of breath, muscle aches)     Body aches and congestion, fatigue 4. COUGH: "Do you have a cough?" If so, ask: "How bad is the cough?"       Cough, dry, "hot throat" 5. FEVER: "Do you have a fever?" If so, ask: "What is your temperature, how was it measured, and when did it start?"     10 0.0 Sunday 6. RESPIRATORY STATUS: "Describe your breathing?" (e.g., shortness of breath, wheezing, unable to speak)      stuffy 7. BETTER-SAME-WORSE: "Are you getting better, staying the same or getting worse compared to yesterday?"  If getting worse, ask, "In what way?"     worse 8. HIGH RISK DISEASE: "Do you have any chronic medical problems?" (e.g., asthma, heart or lung disease, weak immune system, etc.)     9. PREGNANCY: "Is there any chance you are pregnant?" "When was your last  menstrual period?"     no 10. OTHER SYMPTOMS: "Do you have any other symptoms?"  (e.g., chills, fatigue, headache, loss of smell or taste, muscle pain, sore throat)       Congestion, vomiting Sunday, not presently, nausea presently, diarrhea  Protocols used: CORONAVIRUS (COVID-19) DIAGNOSED OR SUSPECTED-A-AH

## 2019-03-17 NOTE — Telephone Encounter (Signed)
Referred by evisit with Dr. Sharlet Salina at [facility]. Appointment made for tomorrow at 12:45p at the [facility] site. Informed to wear mask and stay in vehicle.

## 2019-03-17 NOTE — Progress Notes (Signed)
Virtual Visit via Video Note  I connected with Anna Nguyen on 03/17/19 at  2:00 PM EDT by a video enabled telemedicine application and verified that I am speaking with the correct person using two identifiers.  The patient and the provider were at separate locations throughout the entire encounter.   I discussed the limitations of evaluation and management by telemedicine and the availability of in person appointments. The patient expressed understanding and agreed to proceed.  History of Present Illness: The patient is a 33 y.o. female with visit for fever and cough and congestion and diarrhea. Husband is a Engineer, structural. Her kids were sick previous. Started Sunday morning with vomiting all day Sunday. By the evening she was congested and sneezing. Monday nauseous fatigue and malaise, running nose and body aches. She did run 100 F fever with chills. Denies SOB. Overall it is stable. Has tried tylenol which did not help much. 6 of her husband's co-workers have tested positive in the last week.   Observations/Objective: Appearance: normal, breathing appears normal, coughing during visit, casual grooming, abdomen does not appear distended, throat not visualized, mental status is A and O times 3  Assessment and Plan: See problem oriented charting  Follow Up Instructions: testing for covid-19  Visit time 25 minutes: greater than 50% of that time was spent in face to face counseling and coordination of care with the patient: counseled about need to quarantine until test results return for covid-19 as well as how to quarantine and also symptoms to monitor for and plan for if symptoms worsen  I discussed the assessment and treatment plan with the patient. The patient was provided an opportunity to ask questions and all were answered. The patient agreed with the plan and demonstrated an understanding of the instructions.   The patient was advised to call back or seek an in-person evaluation if the  symptoms worsen or if the condition fails to improve as anticipated.  Hoyt Koch, MD

## 2019-03-17 NOTE — Telephone Encounter (Signed)
appt scheduled

## 2019-03-17 NOTE — Assessment & Plan Note (Signed)
Needs testing for covid-19. Rx for tessalon perles and zofran for symptom relief. Advised to quarantine as well as need to seek care for SOB or worsening symptoms.

## 2019-03-17 NOTE — Telephone Encounter (Signed)
This encounter was created in error - please disregard.

## 2019-03-17 NOTE — Telephone Encounter (Signed)
Patient symptoms are cough, body aches, nausea, vomiting, and Dr. Sharlet Salina would like patient to be tested thank you

## 2019-03-17 NOTE — Telephone Encounter (Signed)
Patient is calling with- vomiting, congestion, fever body aches, cough

## 2019-03-17 NOTE — Telephone Encounter (Signed)
Noted thanks °

## 2019-03-18 ENCOUNTER — Other Ambulatory Visit: Payer: Self-pay

## 2019-03-18 DIAGNOSIS — Z20822 Contact with and (suspected) exposure to covid-19: Secondary | ICD-10-CM

## 2019-03-22 ENCOUNTER — Ambulatory Visit: Payer: Self-pay | Admitting: Internal Medicine

## 2019-03-22 NOTE — Telephone Encounter (Signed)
Pt had virtual appt with Dr. Sharlet Salina 03/17/2019, cough, fever, vomiting, diarrhea. Pt tested for Covid 19 Friday, results pending.  Reports worsening cough with wheezing and SOB. Prescribed Tessalon, states ineffective. Reports wheezing, "Deep cough."  States painful when taking deep breaths and SOB at rest. States "Winded" breathing faster than normal. Afebrile.   Pt directed to ED. States she will call UC near her first to see if they will see her. Aware they may send her to ED. [place], given ED charge number to call when she arrives at ED if not seen at [facility]. Pt verbalizes understanding.   Reason for Disposition . MODERATE difficulty breathing (e.g., speaks in phrases, SOB even at rest, pulse 100-120)  Answer Assessment - Initial Assessment Questions 1. COVID-19 DIAGNOSIS: "Who made your Coronavirus (COVID-19) diagnosis?" "Was it confirmed by a positive lab test?" If not diagnosed by a HCP, ask "Are there lots of cases (community spread) where you live?" (See public health department website, if unsure)     NA 2. ONSET: "When did the COVID-19 symptoms start?"      Worsening Sunday, covid results pending 3. WORST SYMPTOM: "What is your worst symptom?" (e.g., cough, fever, shortness of breath, muscle aches)    Cough, wheezing 4. COUGH: "Do you have a cough?" If so, ask: "How bad is the cough?"       yes 5. FEVER: "Do you have a fever?" If so, ask: "What is your temperature, how was it measured, and when did it start?"     no 6. RESPIRATORY STATUS: "Describe your breathing?" (e.g., shortness of breath, wheezing, unable to speak)      Wheezing, SOB 7. BETTER-SAME-WORSE: "Are you getting better, staying the same or getting worse compared to yesterday?"  If getting worse, ask, "In what way?"     worse 8. HIGH RISK DISEASE: "Do you have any chronic medical problems?" (e.g., asthma, heart or lung disease, weak immune system, etc.)       10 . OTHER SYMPTOMS: "Do you have any other symptoms?"   (e.g., chills, fatigue, headache, loss of smell or taste, muscle pain, sore throat)      wheezing  Protocols used: CORONAVIRUS (COVID-19) DIAGNOSED OR SUSPECTED-A-AH

## 2019-03-24 LAB — NOVEL CORONAVIRUS, NAA: SARS-CoV-2, NAA: NOT DETECTED

## 2019-11-18 ENCOUNTER — Ambulatory Visit: Payer: 59 | Attending: Internal Medicine

## 2019-11-18 DIAGNOSIS — Z23 Encounter for immunization: Secondary | ICD-10-CM

## 2019-11-18 NOTE — Progress Notes (Signed)
   Covid-19 Vaccination Clinic  Name:  Anna Nguyen    MRN: [id number] DOB: 15-Aug-1986  11/18/2019  [name] was observed post Covid-19 immunization for 15 minutes without incident. She was provided with Vaccine Information Sheet and instruction to access the V-Safe system.   [name] was instructed to call 911 with any severe reactions post vaccine: Marland Kitchen Difficulty breathing  . Swelling of face and throat  . A fast heartbeat  . A bad rash all over body  . Dizziness and weakness   Immunizations Administered    Name Date Dose VIS Date Route   Pfizer COVID-19 Vaccine 11/18/2019 10:04 AM 0.3 mL 08/19/2019 Intramuscular   Manufacturer: [employer], [employer]   Lot: [id number]   NDC: [address]-8

## 2019-12-14 ENCOUNTER — Ambulatory Visit: Payer: 59 | Attending: Internal Medicine

## 2019-12-14 DIAGNOSIS — Z23 Encounter for immunization: Secondary | ICD-10-CM

## 2019-12-14 NOTE — Progress Notes (Signed)
   Covid-19 Vaccination Clinic  Name:  Anna Nguyen    MRN: [id number] DOB: 17-Nov-1985  12/14/2019  [name] was observed post Covid-19 immunization for 15 minutes without incident. She was provided with Vaccine Information Sheet and instruction to access the V-Safe system.   [name] was instructed to call 911 with any severe reactions post vaccine: Marland Kitchen Difficulty breathing  . Swelling of face and throat  . A fast heartbeat  . A bad rash all over body  . Dizziness and weakness   Immunizations Administered    Name Date Dose VIS Date Route   Pfizer COVID-19 Vaccine 12/14/2019 12:18 PM 0.03 mL 08/19/2019 Intramuscular   Manufacturer: [employer], [employer]   Lot: [id number]   NDC: [address]-1

## 2020-05-02 ENCOUNTER — Other Ambulatory Visit: Payer: Self-pay | Admitting: Internal Medicine

## 2020-05-02 DIAGNOSIS — E221 Hyperprolactinemia: Secondary | ICD-10-CM

## 2020-05-02 DIAGNOSIS — N643 Galactorrhea not associated with childbirth: Secondary | ICD-10-CM

## 2020-05-19 ENCOUNTER — Ambulatory Visit
Admission: RE | Admit: 2020-05-19 | Discharge: 2020-05-19 | Disposition: A | Discharge status: Home / Self Care | Payer: 59 | Source: Ambulatory Visit | Attending: Internal Medicine | Admitting: Internal Medicine

## 2020-05-19 DIAGNOSIS — E221 Hyperprolactinemia: Secondary | ICD-10-CM

## 2020-05-19 DIAGNOSIS — N643 Galactorrhea not associated with childbirth: Secondary | ICD-10-CM

## 2020-05-19 MED ORDER — GADOBENATE DIMEGLUMINE 529 MG/ML IV SOLN
10.0000 mL | Freq: Once | INTRAVENOUS | Status: AC | PRN
  Administered 2020-05-19: 10 mL via INTRAVENOUS

## 2021-02-27 ENCOUNTER — Ambulatory Visit: Payer: 59 | Admitting: Internal Medicine

## 2021-02-27 ENCOUNTER — Encounter: Payer: Self-pay | Admitting: Internal Medicine

## 2021-02-27 ENCOUNTER — Other Ambulatory Visit: Payer: Self-pay

## 2021-02-27 VITALS — BP 136/86 | HR 75 | Temp 98.2°F | Resp 16 | Ht 69.0 in | Wt 243.0 lb

## 2021-02-27 DIAGNOSIS — D520 Dietary folate deficiency anemia: Secondary | ICD-10-CM

## 2021-02-27 DIAGNOSIS — D510 Vitamin B12 deficiency anemia due to intrinsic factor deficiency: Secondary | ICD-10-CM | POA: Diagnosis not present

## 2021-02-27 DIAGNOSIS — Z9884 Bariatric surgery status: Secondary | ICD-10-CM

## 2021-02-27 DIAGNOSIS — R739 Hyperglycemia, unspecified: Secondary | ICD-10-CM | POA: Diagnosis not present

## 2021-02-27 DIAGNOSIS — Z0001 Encounter for general adult medical examination with abnormal findings: Secondary | ICD-10-CM | POA: Diagnosis not present

## 2021-02-27 DIAGNOSIS — I10 Essential (primary) hypertension: Secondary | ICD-10-CM | POA: Diagnosis not present

## 2021-02-27 DIAGNOSIS — E559 Vitamin D deficiency, unspecified: Secondary | ICD-10-CM | POA: Diagnosis not present

## 2021-02-27 DIAGNOSIS — E118 Type 2 diabetes mellitus with unspecified complications: Secondary | ICD-10-CM | POA: Insufficient documentation

## 2021-02-27 DIAGNOSIS — F900 Attention-deficit hyperactivity disorder, predominantly inattentive type: Secondary | ICD-10-CM | POA: Insufficient documentation

## 2021-02-27 DIAGNOSIS — D508 Other iron deficiency anemias: Secondary | ICD-10-CM

## 2021-02-27 LAB — LIPID PANEL
Cholesterol: 171 mg/dL (ref 0–200)
HDL: 59.6 mg/dL (ref >39.00)
LDL Cholesterol: 82 mg/dL (ref 0–99)
NonHDL: 111.1
Total CHOL/HDL Ratio: 3
Triglycerides: 148 mg/dL (ref 0.0–149.0)
VLDL: 29.6 mg/dL (ref 0.0–40.0)

## 2021-02-27 LAB — CBC WITH DIFFERENTIAL/PLATELET
Basophils Absolute: 0 10*3/uL (ref 0.0–0.1)
Basophils Relative: 0.4 % (ref 0.0–3.0)
Eosinophils Absolute: 0.2 10*3/uL (ref 0.0–0.7)
Eosinophils Relative: 2.3 % (ref 0.0–5.0)
HCT: 32.2 % — ABNORMAL LOW (ref 36.0–46.0)
Hemoglobin: 10.6 g/dL — ABNORMAL LOW (ref 12.0–15.0)
Lymphocytes Relative: 50.9 % — ABNORMAL HIGH (ref 12.0–46.0)
Lymphs Abs: 3.4 10*3/uL (ref 0.7–4.0)
MCHC: 32.9 g/dL (ref 30.0–36.0)
MCV: 84.8 fl (ref 78.0–100.0)
Monocytes Absolute: 0.4 10*3/uL (ref 0.1–1.0)
Monocytes Relative: 6.3 % (ref 3.0–12.0)
Neutro Abs: 2.7 10*3/uL (ref 1.4–7.7)
Neutrophils Relative %: 40.1 % — ABNORMAL LOW (ref 43.0–77.0)
Platelets: 212 10*3/uL (ref 150.0–400.0)
RBC: 3.8 Mil/uL — ABNORMAL LOW (ref 3.87–5.11)
RDW: 15.7 % — ABNORMAL HIGH (ref 11.5–15.5)
WBC: 6.8 10*3/uL (ref 4.0–10.5)

## 2021-02-27 LAB — BASIC METABOLIC PANEL
BUN: 11 mg/dL (ref 6–23)
CO2: 27 mEq/L (ref 19–32)
Calcium: 9.4 mg/dL (ref 8.4–10.5)
Chloride: 104 mEq/L (ref 96–112)
Creatinine, Ser: 0.87 mg/dL (ref 0.40–1.20)
GFR: 86.5 mL/min (ref >60.00)
Glucose, Bld: 94 mg/dL (ref 70–99)
Potassium: 4.2 mEq/L (ref 3.5–5.1)
Sodium: 136 mEq/L (ref 135–145)

## 2021-02-27 LAB — HCG, QUANTITATIVE, PREGNANCY: Quantitative HCG: 3.19 m[IU]/mL

## 2021-02-27 LAB — TSH: TSH: 2.03 u[IU]/mL (ref 0.35–4.50)

## 2021-02-27 LAB — FERRITIN: Ferritin: 9.7 ng/mL — ABNORMAL LOW (ref 10.0–291.0)

## 2021-02-27 LAB — VITAMIN B12: Vitamin B-12: 98 pg/mL — ABNORMAL LOW (ref 211–911)

## 2021-02-27 LAB — IRON: Iron: 39 ug/dL — ABNORMAL LOW (ref 42–145)

## 2021-02-27 LAB — VITAMIN D 25 HYDROXY (VIT D DEFICIENCY, FRACTURES): VITD: 8.97 ng/mL — ABNORMAL LOW (ref 30.00–100.00)

## 2021-02-27 LAB — FOLATE: Folate: 5.7 ng/mL — ABNORMAL LOW (ref >5.9)

## 2021-02-27 LAB — HEMOGLOBIN A1C: Hgb A1c MFr Bld: 6.6 % — ABNORMAL HIGH (ref 4.6–6.5)

## 2021-02-27 MED ORDER — FOLIC ACID 1 MG PO TABS: 1.0000 mg | ORAL_TABLET | Freq: Every day | ORAL | 1 refills | Status: AC

## 2021-02-27 MED ORDER — METHYLPHENIDATE HCL ER (OSM) 18 MG PO TBCR: 18.0000 mg | EXTENDED_RELEASE_TABLET | Freq: Every day | ORAL | 0 refills | Status: DC

## 2021-02-27 MED ORDER — ACCRUFER 30 MG PO CAPS: 1.0000 | ORAL_CAPSULE | Freq: Two times a day (BID) | ORAL | 1 refills | Status: DC

## 2021-02-27 MED ORDER — CHOLECALCIFEROL 1.25 MG (50000 UT) PO CAPS
50000.0000 [IU] | ORAL_CAPSULE | ORAL | 1 refills | Status: DC
Start: 2021-02-27 — End: 2021-09-13

## 2021-02-27 NOTE — Progress Notes (Signed)
E11.8   Subjective:  Patient ID: Anna Nguyen, female    DOB: 1986-06-16  Age: 35 y.o. MRN: [id number]  CC: Annual Exam, Anemia, and Hypertension  This visit occurred during the SARS-CoV-2 public health emergency.  Safety protocols were in place, including screening questions prior to the visit, additional usage of staff PPE, and extensive cleaning of exam room while observing appropriate contact time as indicated for disinfecting solutions.    HPI [employer] presents for a CPX and f/up -   She has a history of hypertension.  She has controlled her blood pressure with lifestyle modifications.  She complains of a 57-month history of decreased attention span, fatigue, lack of focus, and decreased productivity at home and in the workplace.  She would like to try medication for ADHD.  She has a history of bariatric surgery as well as vitamin deficiencies and is not currently taking any vitamin supplements.  Outpatient Medications Prior to Visit  Medication Sig Dispense Refill   risperiDONE (RISPERDAL PO) Take by mouth.     zolpidem (AMBIEN) 5 MG tablet zolpidem 5 mg tablet     benzonatate (TESSALON) 200 MG capsule Take 1 capsule (200 mg total) by mouth 3 (three) times daily as needed. 60 capsule 0   Cholecalciferol 50000 units capsule Take 1 capsule (50,000 Units total) by mouth once a week. (Patient not taking: Reported on 02/27/2021) 12 capsule 1   escitalopram (LEXAPRO) 10 MG tablet Take 10 mg by mouth at bedtime.     LORAZEPAM PO Take by mouth. (Patient not taking: Reported on 02/27/2021)     metoCLOPramide (REGLAN) 10 MG tablet Take 10 mg by mouth 4 (four) times daily. (Patient not taking: Reported on 02/27/2021)     ondansetron (ZOFRAN-ODT) 4 MG disintegrating tablet Take 1 tablet (4 mg total) by mouth every 8 (eight) hours as needed for nausea or vomiting. 15 tablet 0   triamterene-hydrochlorothiazide (DYAZIDE) 37.5-25 MG capsule Take 1 each (1 capsule total) by mouth daily. (Patient not  taking: Reported on 02/27/2021) 90 capsule 0   No facility-administered medications prior to visit.    ROS Review of Systems  Constitutional:  Positive for fatigue. Negative for appetite change, chills, diaphoresis, fever and unexpected weight change.  HENT: Negative.    Eyes: Negative.   Respiratory:  Negative for cough, chest tightness, shortness of breath and wheezing.   Cardiovascular:  Negative for chest pain, palpitations and leg swelling.  Gastrointestinal:  Negative for abdominal pain, blood in stool, constipation, diarrhea, nausea and vomiting.  Endocrine: Negative.   Genitourinary: Negative.  Negative for decreased urine volume, difficulty urinating, dysuria, hematuria and urgency.  Musculoskeletal: Negative.  Negative for arthralgias, back pain and myalgias.  Skin:  Positive for pallor.  Neurological: Negative.  Negative for dizziness, weakness, light-headedness and numbness.  Hematological:  Negative for adenopathy. Does not bruise/bleed easily.  Psychiatric/Behavioral:  Positive for decreased concentration. Negative for dysphoric mood, self-injury, sleep disturbance and suicidal ideas. The patient is not nervous/anxious.    Objective:  BP 136/86 (BP Location: Left Arm, Patient Position: Sitting, Cuff Size: Large)   Pulse 75   Temp 98.2 F (36.8 C) (Oral)   Resp 16   Ht 5\' 9"  (1.753 m)   Wt 243 lb (110.2 kg)   LMP 01/28/2021 Comment: Husband has had a vasectomy  SpO2 100%   BMI 35.88 kg/m   BP Readings from Last 3 Encounters:  02/27/21 136/86  12/09/18 (!) 139/91  06/22/18 (!) 144/100    Wt Readings  from Last 3 Encounters:  02/27/21 243 lb (110.2 kg)  06/22/18 255 lb (115.7 kg)  05/31/18 261 lb (118.4 kg)    Physical Exam Vitals reviewed.  HENT:     Nose: Nose normal.     Mouth/Throat:     Mouth: Mucous membranes are moist.  Eyes:     General: No scleral icterus.    Conjunctiva/sclera: Conjunctivae normal.  Cardiovascular:     Rate and Rhythm: Normal  rate and regular rhythm.     Heart sounds: No murmur heard. Pulmonary:     Effort: Pulmonary effort is normal.     Breath sounds: No stridor. No wheezing, rhonchi or rales.  Abdominal:     General: Abdomen is protuberant. Bowel sounds are normal. There is no distension.     Palpations: Abdomen is soft. There is no hepatomegaly, splenomegaly or mass.     Tenderness: There is no abdominal tenderness. There is no guarding.  Musculoskeletal:        General: Normal range of motion.     Cervical back: Neck supple.     Right lower leg: No edema.  Lymphadenopathy:     Cervical: No cervical adenopathy.  Skin:    General: Skin is warm and dry.     Coloration: Skin is pale.  Neurological:     General: No focal deficit present.     Mental Status: She is alert and oriented to person, place, and time. Mental status is at baseline.  Psychiatric:        Mood and Affect: Mood normal.        Behavior: Behavior normal.    Lab Results  Component Value Date   WBC 6.8 02/27/2021   HGB 10.6 (L) 02/27/2021   HCT 32.2 (L) 02/27/2021   PLT 212.0 02/27/2021   GLUCOSE 94 02/27/2021   CHOL 171 02/27/2021   TRIG 148.0 02/27/2021   HDL 59.60 02/27/2021   LDLCALC 82 02/27/2021   ALT 9 (L) 03/18/2017   AST 12 (L) 03/18/2017   NA 136 02/27/2021   K 4.2 02/27/2021   CL 104 02/27/2021   CREATININE 0.87 02/27/2021   BUN 11 02/27/2021   CO2 27 02/27/2021   TSH 2.03 02/27/2021   HGBA1C 6.6 (H) 02/27/2021   MICROALBUR 1.13 10/03/2008    MR BRAIN W WO CONTRAST  Result Date: 05/20/2020 CLINICAL DATA:  Galactorrhea.  Hyperprolactinemia. EXAM: MRI HEAD WITHOUT AND WITH CONTRAST TECHNIQUE: Multiplanar, multiecho pulse sequences of the brain and surrounding structures were obtained without and with intravenous contrast. CONTRAST:  32mL MULTIHANCE GADOBENATE DIMEGLUMINE 529 MG/ML IV SOLN COMPARISON:  None. FINDINGS: Brain: Conventional imaging the brain demonstrates no acute infarct, hemorrhage, or mass lesion.  No significant white matter disease is present. The ventricles are of normal size. No significant extraaxial fluid collection is present. The internal auditory canals are within normal limits. The brainstem and cerebellum are within normal limits. Dedicated imaging of the pituitary gland demonstrates a normal size gland. The territory stalk is midline. Minimal asymmetry of the gland is noted on the left without a discrete lesion. The gland enhances homogeneously. The cavernous sinus is normal bilaterally. Optic chiasm is normal. Postcontrast imaging of the remainder the brain is unremarkable. Vascular: Flow is present in the major intracranial arteries. Skull and upper cervical spine: The craniocervical junction is normal. Upper cervical spine is within normal limits. Marrow signal is unremarkable. Sinuses/Orbits: Left maxillary sinus is shrunken, consistent with a history of chronic or recurrent disease. No active disease  is present. The paranasal sinuses and mastoid air cells are otherwise clear. The globes and orbits are within normal limits. IMPRESSION: 1. Normal MRI appearance of the brain and pituitary without and with contrast. 2. No focal pituitary lesion or abnormal enhancement. 3. Shrunken left maxillary sinus, consistent with a history of chronic or recurrent disease. No active disease is present. Electronically Signed   By: Marin Roberts M.D.   On: 05/20/2020 07:46    Assessment & Plan:   Makynleigh was seen today for annual exam, anemia and hypertension.  Diagnoses and all orders for this visit:  Primary hypertension- Her blood pressure is adequately well controlled. -     CBC with Differential/Platelet; Future -     Basic metabolic panel; Future -     TSH; Future -     hCG, quantitative, pregnancy; Future -     hCG, quantitative, pregnancy -     TSH -     Basic metabolic panel -     CBC with Differential/Platelet  Vitamin B12 deficiency anemia due to intrinsic factor deficiency-  Her B12 level is low.  I have asked her to start parenteral B12 replacement therapy. -     CBC with Differential/Platelet; Future -     CBC with Differential/Platelet  Dietary folate deficiency anemia - Her H&H and folate levels are low.  I recommend that she start an oral folate supplement. -     CBC with Differential/Platelet; Future -     CBC with Differential/Platelet -     folic acid (FOLVITE) 1 MG tablet; Take 1 tablet (1 mg total) by mouth daily.  Other iron deficiency anemia- I recommended that she treat this with oral and parenteral iron replacement therapy. -     CBC with Differential/Platelet; Future -     CBC with Differential/Platelet -     Ferric Maltol (ACCRUFER) 30 MG CAPS; Take 1 capsule by mouth in the morning and at bedtime. -     Ambulatory referral to Hematology / Oncology  Vitamin D deficiency -     VITAMIN D 25 Hydroxy (Vit-D Deficiency, Fractures); Future -     VITAMIN D 25 Hydroxy (Vit-D Deficiency, Fractures) -     Cholecalciferol 1.25 MG (50000 UT) capsule; Take 1 capsule (50,000 Units total) by mouth once a week.  Gastric bypass status for obesity- Will screen for vitamin deficiencies. -     Iron; Future -     Vitamin B1; Future -     Zinc; Future -     Folate; Future -     Ferritin; Future -     Vitamin B12; Future -     Vitamin B12 -     Ferritin -     Folate -     Zinc -     Vitamin B1 -     Iron -     Ferric Maltol (ACCRUFER) 30 MG CAPS; Take 1 capsule by mouth in the morning and at bedtime. -     Cholecalciferol 1.25 MG (50000 UT) capsule; Take 1 capsule (50,000 Units total) by mouth once a week.  Encounter for general adult medical examination with abnormal findings- Exam completed, labs reviewed, vaccines reviewed, cancers screenings are UTD, pt ed material was given. -     Lipid panel; Future -     Lipid panel  Hyperglycemia -     Hemoglobin A1c; Future -     Hemoglobin A1c  Type II diabetes mellitus with manifestations (HCC)-  She will  cont to work on her lifestyle modifications.  ADHD (attention deficit hyperactivity disorder), inattentive type -     methylphenidate (CONCERTA) 18 MG CR tablet; Take 1 tablet (18 mg total) by mouth daily.  I have discontinued Jaleen Wailes's escitalopram, zolpidem, triamterene-hydrochlorothiazide, metoCLOPramide, LORAZEPAM PO, ondansetron, and benzonatate. I have also changed her Cholecalciferol. Additionally, I am having her start on folic acid, ACCRUFeR, and methylphenidate. Lastly, I am having her maintain her risperiDONE (RISPERDAL PO).  Meds ordered this encounter  Medications   folic acid (FOLVITE) 1 MG tablet    Sig: Take 1 tablet (1 mg total) by mouth daily.    Dispense:  90 tablet    Refill:  1   Ferric Maltol (ACCRUFER) 30 MG CAPS    Sig: Take 1 capsule by mouth in the morning and at bedtime.    Dispense:  180 capsule    Refill:  1   Cholecalciferol 1.25 MG (50000 UT) capsule    Sig: Take 1 capsule (50,000 Units total) by mouth once a week.    Dispense:  12 capsule    Refill:  1   methylphenidate (CONCERTA) 18 MG CR tablet    Sig: Take 1 tablet (18 mg total) by mouth daily.    Dispense:  30 tablet    Refill:  0      Follow-up: Return in about 3 months (around 05/30/2021).  Sanda Lingerhomas Tobie Hellen, MD

## 2021-02-27 NOTE — Patient Instructions (Signed)
Health Maintenance, Female Adopting a healthy lifestyle and getting preventive care are important in promoting health and wellness. Ask your health care provider about: The right schedule for you to have regular tests and exams. Things you can do on your own to prevent diseases and keep yourself healthy. What should I know about diet, weight, and exercise? Eat a healthy diet  Eat a diet that includes plenty of vegetables, fruits, low-fat dairy products, and lean protein. Do not eat a lot of foods that are high in solid fats, added sugars, or sodium.  Maintain a healthy weight Body mass index (BMI) is used to identify weight problems. It estimates body fat based on height and weight. Your health care provider can help determineyour BMI and help you achieve or maintain a healthy weight. Get regular exercise Get regular exercise. This is one of the most important things you can do for your health. Most adults should: Exercise for at least 150 minutes each week. The exercise should increase your heart rate and make you sweat (moderate-intensity exercise). Do strengthening exercises at least twice a week. This is in addition to the moderate-intensity exercise. Spend less time sitting. Even light physical activity can be beneficial. Watch cholesterol and blood lipids Have your blood tested for lipids and cholesterol at 35 years of age, then havethis test every 5 years. Have your cholesterol levels checked more often if: Your lipid or cholesterol levels are high. You are older than 35 years of age. You are at high risk for heart disease. What should I know about cancer screening? Depending on your health history and family history, you may need to have cancer screening at various ages. This may include screening for: Breast cancer. Cervical cancer. Colorectal cancer. Skin cancer. Lung cancer. What should I know about heart disease, diabetes, and high blood pressure? Blood pressure and heart  disease High blood pressure causes heart disease and increases the risk of stroke. This is more likely to develop in people who have high blood pressure readings, are of African descent, or are overweight. Have your blood pressure checked: Every 3-5 years if you are 18-39 years of age. Every year if you are 40 years old or older. Diabetes Have regular diabetes screenings. This checks your fasting blood sugar level. Have the screening done: Once every three years after age 40 if you are at a normal weight and have a low risk for diabetes. More often and at a younger age if you are overweight or have a high risk for diabetes. What should I know about preventing infection? Hepatitis B If you have a higher risk for hepatitis B, you should be screened for this virus. Talk with your health care provider to find out if you are at risk forhepatitis B infection. Hepatitis C Testing is recommended for: Everyone born from 1945 through 1965. Anyone with known risk factors for hepatitis C. Sexually transmitted infections (STIs) Get screened for STIs, including gonorrhea and chlamydia, if: You are sexually active and are younger than 35 years of age. You are older than 35 years of age and your health care provider tells you that you are at risk for this type of infection. Your sexual activity has changed since you were last screened, and you are at increased risk for chlamydia or gonorrhea. Ask your health care provider if you are at risk. Ask your health care provider about whether you are at high risk for HIV. Your health care provider may recommend a prescription medicine to help   prevent HIV infection. If you choose to take medicine to prevent HIV, you should first get tested for HIV. You should then be tested every 3 months for as long as you are taking the medicine. Pregnancy If you are about to stop having your period (premenopausal) and you may become pregnant, seek counseling before you get  pregnant. Take 400 to 800 micrograms (mcg) of folic acid every day if you become pregnant. Ask for birth control (contraception) if you want to prevent pregnancy. Osteoporosis and menopause Osteoporosis is a disease in which the bones lose minerals and strength with aging. This can result in bone fractures. If you are 65 years old or older, or if you are at risk for osteoporosis and fractures, ask your health care provider if you should: Be screened for bone loss. Take a calcium or vitamin D supplement to lower your risk of fractures. Be given hormone replacement therapy (HRT) to treat symptoms of menopause. Follow these instructions at home: Lifestyle Do not use any products that contain nicotine or tobacco, such as cigarettes, e-cigarettes, and chewing tobacco. If you need help quitting, ask your health care provider. Do not use street drugs. Do not share needles. Ask your health care provider for help if you need support or information about quitting drugs. Alcohol use Do not drink alcohol if: Your health care provider tells you not to drink. You are pregnant, may be pregnant, or are planning to become pregnant. If you drink alcohol: Limit how much you use to 0-1 drink a day. Limit intake if you are breastfeeding. Be aware of how much alcohol is in your drink. In the [place] one drink equals one 12 oz bottle of beer (355 mL), one 5 oz glass of wine (148 mL), or one 1 oz glass of hard liquor (44 mL). General instructions Schedule regular health, dental, and eye exams. Stay current with your vaccines. Tell your health care provider if: You often feel depressed. You have ever been abused or do not feel safe at home. Summary Adopting a healthy lifestyle and getting preventive care are important in promoting health and wellness. Follow your health care provider's instructions about healthy diet, exercising, and getting tested or screened for diseases. Follow your health care provider's  instructions on monitoring your cholesterol and blood pressure. This information is not intended to replace advice given to you by your health care provider. Make sure you discuss any questions you have with your healthcare provider. Document Revised: 08/18/2018 Document Reviewed: 08/18/2018 Elsevier Patient Education  2022 [employer]  

## 2021-03-01 ENCOUNTER — Telehealth: Payer: Self-pay | Admitting: Hematology and Oncology

## 2021-03-01 NOTE — Telephone Encounter (Signed)
Received a new hem referral from Anna Nguyen for IDA. Anna Nguyen was last seen by Anna Nguyen in 2017. Pt has been cld and scheduled to see Anna Nguyen on 7/5 at 1pm. Pt aware to arrive 20 minutes early.

## 2021-03-05 ENCOUNTER — Other Ambulatory Visit: Payer: Self-pay | Admitting: Internal Medicine

## 2021-03-05 DIAGNOSIS — D538 Other specified nutritional anemias: Secondary | ICD-10-CM | POA: Insufficient documentation

## 2021-03-05 LAB — EXTRA SPECIMEN

## 2021-03-05 LAB — ZINC: Zinc: 47 ug/dL — ABNORMAL LOW (ref 60–130)

## 2021-03-05 LAB — VITAMIN B1

## 2021-03-05 MED ORDER — ZINC GLUCONATE 50 MG PO TABS: 50.0000 mg | ORAL_TABLET | Freq: Every day | ORAL | 1 refills | Status: DC

## 2021-03-12 ENCOUNTER — Encounter: Payer: Self-pay | Admitting: Hematology and Oncology

## 2021-03-12 ENCOUNTER — Other Ambulatory Visit: Payer: Self-pay

## 2021-03-12 ENCOUNTER — Inpatient Hospital Stay: Payer: 59 | Attending: Hematology and Oncology | Admitting: Hematology and Oncology

## 2021-03-12 VITALS — BP 130/78 | HR 86 | Temp 98.6°F | Resp 18 | Ht 69.0 in | Wt 241.6 lb

## 2021-03-12 DIAGNOSIS — D518 Other vitamin B12 deficiency anemias: Secondary | ICD-10-CM | POA: Diagnosis not present

## 2021-03-12 DIAGNOSIS — E282 Polycystic ovarian syndrome: Secondary | ICD-10-CM | POA: Diagnosis not present

## 2021-03-12 DIAGNOSIS — Z836 Family history of other diseases of the respiratory system: Secondary | ICD-10-CM | POA: Diagnosis not present

## 2021-03-12 DIAGNOSIS — R58 Hemorrhage, not elsewhere classified: Secondary | ICD-10-CM | POA: Diagnosis not present

## 2021-03-12 DIAGNOSIS — Z8349 Family history of other endocrine, nutritional and metabolic diseases: Secondary | ICD-10-CM | POA: Insufficient documentation

## 2021-03-12 DIAGNOSIS — Z833 Family history of diabetes mellitus: Secondary | ICD-10-CM | POA: Diagnosis not present

## 2021-03-12 DIAGNOSIS — Z8261 Family history of arthritis: Secondary | ICD-10-CM | POA: Diagnosis not present

## 2021-03-12 DIAGNOSIS — Z9884 Bariatric surgery status: Secondary | ICD-10-CM | POA: Diagnosis not present

## 2021-03-12 DIAGNOSIS — E559 Vitamin D deficiency, unspecified: Secondary | ICD-10-CM

## 2021-03-12 DIAGNOSIS — D539 Nutritional anemia, unspecified: Secondary | ICD-10-CM

## 2021-03-12 DIAGNOSIS — Z8042 Family history of malignant neoplasm of prostate: Secondary | ICD-10-CM | POA: Insufficient documentation

## 2021-03-12 DIAGNOSIS — Z803 Family history of malignant neoplasm of breast: Secondary | ICD-10-CM | POA: Diagnosis not present

## 2021-03-12 DIAGNOSIS — Z8249 Family history of ischemic heart disease and other diseases of the circulatory system: Secondary | ICD-10-CM | POA: Diagnosis not present

## 2021-03-12 DIAGNOSIS — Z79899 Other long term (current) drug therapy: Secondary | ICD-10-CM | POA: Diagnosis not present

## 2021-03-12 DIAGNOSIS — D508 Other iron deficiency anemias: Secondary | ICD-10-CM | POA: Diagnosis not present

## 2021-03-12 DIAGNOSIS — E538 Deficiency of other specified B group vitamins: Secondary | ICD-10-CM | POA: Insufficient documentation

## 2021-03-12 DIAGNOSIS — D538 Other specified nutritional anemias: Secondary | ICD-10-CM

## 2021-03-12 DIAGNOSIS — K909 Intestinal malabsorption, unspecified: Secondary | ICD-10-CM | POA: Insufficient documentation

## 2021-03-12 DIAGNOSIS — E60 Dietary zinc deficiency: Secondary | ICD-10-CM | POA: Insufficient documentation

## 2021-03-12 DIAGNOSIS — D500 Iron deficiency anemia secondary to blood loss (chronic): Secondary | ICD-10-CM | POA: Insufficient documentation

## 2021-03-12 DIAGNOSIS — D520 Dietary folate deficiency anemia: Secondary | ICD-10-CM

## 2021-03-12 MED ORDER — CYANOCOBALAMIN 1000 MCG/ML IJ SOLN: INTRAMUSCULAR | 3 refills | Status: DC

## 2021-03-12 NOTE — Assessment & Plan Note (Signed)
The most likely cause of her anemia is due to chronic blood loss/malabsorption syndrome. We discussed some of the risks, benefits, and alternatives of intravenous iron infusions. The patient is symptomatic from anemia and the iron level is critically low. She tolerated oral iron supplement poorly and desires to achieved higher levels of iron faster for adequate hematopoesis. Some of the side-effects to be expected including risks of infusion reactions, phlebitis, headaches, nausea and fatigue.  The patient is willing to proceed. Patient education material was dispensed.  Goal is to keep ferritin level greater than 50 and resolution of anemia I plan to schedule IV iron sucrose x3 She will continue folic acid supplementation

## 2021-03-12 NOTE — Assessment & Plan Note (Signed)
She will continue oral zinc supplement I will also check copper level in her next visit

## 2021-03-12 NOTE — Assessment & Plan Note (Signed)
I reminded her the importance of vitamin D supplement

## 2021-03-12 NOTE — Assessment & Plan Note (Signed)
She will continue oral folate supplement

## 2021-03-12 NOTE — Assessment & Plan Note (Signed)
She has profound vitamin B12 deficiency I recommend weekly vitamin B12 injection x4 and then monthly

## 2021-03-12 NOTE — Progress Notes (Signed)
[facility] FOLLOW-UP progress notes  Patient Care Team: Etta Grandchild, MD as PCP - General Artis Delay, MD as Consulting Physician (Hematology and Oncology) Waynard Reeds, MD as Consulting Physician (Obstetrics and Gynecology)  CHIEF COMPLAINTS/PURPOSE OF VISIT:  Multiple mineral deficiencies status post weight loss surgery  HISTORY OF PRESENTING ILLNESS:  Anna Nguyen 35 y.o. female is referred back to me for multiple mineral deficiencies.  She was last seen in May 2017 and then was lost to follow-up She is billed as a new patient today  The patient has history of polycystic ovarian syndrome and morbid obesity status post laparoscopic gastric banding procedure. She was found to have multiple mineral deficiency including iron deficiency, folate deficiency, zinc deficiency, B-12 deficiency and vitamin D deficiency since her gastric procedure in 2012.   I reviewed the patient's records and recent blood draw dated 02/27/2021 Her serum hemoglobin is 10.6 Iron is low at 39 Ferritin is low at 9.7 She has folate deficiency at 5.7 Vitamin D at 8.97 B12 at 98 and zinc at 47 The patient admits that she has not been compliant taking all her mineral supplementation She complained of excessive fatigue She denies excessive menorrhagia The patient denies any recent signs or symptoms of bleeding such as spontaneous epistaxis, hematuria or hematochezia.  MEDICAL HISTORY:  Past Medical History:  Diagnosis Date   Anemia, iron deficiency    HTN (hypertension)    with last pregnancy   PCOS (polycystic ovarian syndrome)    Type II or unspecified type diabetes mellitus without mention of complication, not stated as uncontrolled    not on insulin    SURGICAL HISTORY: Past Surgical History:  Procedure Laterality Date   CESAREAN SECTION N/A 07/08/2017   Procedure: CESAREAN SECTION;  Surgeon: Carrington Clamp, MD;  Location: [facility] BIRTHING SUITES;  Service: Obstetrics;  Laterality:  N/A;   LAPAROSCOPIC GASTRIC BANDING     TONSILLECTOMY     WISDOM TOOTH EXTRACTION      SOCIAL HISTORY: Social History   Socioeconomic History   Marital status: Married    Spouse name: Not on file   Number of children: Not on file   Years of education: Not on file   Highest education level: Not on file  Occupational History   Occupation: Investment banker, corporate: [employer]    Comment: CSR  Tobacco Use   Smoking status: Never   Smokeless tobacco: Never  Vaping Use   Vaping Use: Never used  Substance and Sexual Activity   Alcohol use: No   Drug use: No   Sexual activity: Yes    Partners: Male    Birth control/protection: None, Other-see comments    Comment: husband had vasectomy  Other Topics Concern   Not on file  Social History Narrative   Regular Exercise-yes   Caffeine use: occasionaly               Social Determinants of Health   Financial Resource Strain: Not on file  Food Insecurity: Not on file  Transportation Needs: Not on file  Physical Activity: Not on file  Stress: Not on file  Social Connections: Not on file  Intimate Partner Violence: Not on file    FAMILY HISTORY: Family History  Problem Relation Age of Onset   Arthritis Other    Cancer Maternal Aunt        breast   Diabetes Maternal Uncle    Hyperlipidemia Maternal Uncle    Hypertension Maternal Uncle  Asthma Maternal Grandmother    Diabetes Maternal Grandmother    Hyperlipidemia Maternal Grandmother    Hypertension Maternal Grandmother    Asthma Maternal Grandfather    Diabetes Maternal Grandfather    Hyperlipidemia Maternal Grandfather    Hypertension Maternal Grandfather    Prostate cancer Paternal Grandfather     ALLERGIES:  is allergic to aspirin, nsaids, and latex.  MEDICATIONS:  Current Outpatient Medications  Medication Sig Dispense Refill   cyanocobalamin (,VITAMIN B-12,) 1000 MCG/ML injection Inject 1000 mcg (or 1 mg) subcutaneously once a week for 4 weeks and  then monthly 10 mL 3   Cholecalciferol 1.25 MG (50000 UT) capsule Take 1 capsule (50,000 Units total) by mouth once a week. 12 capsule 1   folic acid (FOLVITE) 1 MG tablet Take 1 tablet (1 mg total) by mouth daily. 90 tablet 1   methylphenidate (CONCERTA) 18 MG CR tablet Take 1 tablet (18 mg total) by mouth daily. 30 tablet 0   risperiDONE (RISPERDAL PO) Take by mouth.     zinc gluconate 50 MG tablet Take 1 tablet (50 mg total) by mouth daily. 90 tablet 1   No current facility-administered medications for this visit.    REVIEW OF SYSTEMS:   Constitutional: Denies fevers, chills or abnormal night sweats Eyes: Denies blurriness of vision, double vision or watery eyes Ears, nose, mouth, throat, and face: Denies mucositis or sore throat Respiratory: Denies cough, dyspnea or wheezes Cardiovascular: Denies palpitation, chest discomfort or lower extremity swelling Gastrointestinal:  Denies nausea, heartburn or change in bowel habits Skin: Denies abnormal skin rashes Lymphatics: Denies new lymphadenopathy or easy bruising Neurological:Denies numbness, tingling or new weaknesses Behavioral/Psych: Mood is stable, no new changes  All other systems were reviewed with the patient and are negative.  PHYSICAL EXAMINATION: ECOG PERFORMANCE STATUS: 1 - Symptomatic but completely ambulatory  Vitals:   03/12/21 1315  BP: 130/78  Pulse: 86  Resp: 18  Temp: 98.6 F (37 C)  SpO2: 100%   Filed Weights   03/12/21 1315  Weight: 241 lb 9.6 oz (109.6 kg)    GENERAL:alert, no distress and comfortable SKIN: skin color, texture, turgor are normal, no rashes or significant lesions EYES: normal, conjunctiva are pink and non-injected, sclera clear OROPHARYNX:no exudate, normal lips, buccal mucosa, and tongue  NECK: supple, thyroid normal size, non-tender, without nodularity LYMPH:  no palpable lymphadenopathy in the cervical, axillary or inguinal LUNGS: clear to auscultation and percussion with normal  breathing effort HEART: regular rate & rhythm and no murmurs without lower extremity edema ABDOMEN:abdomen soft, non-tender and normal bowel sounds Musculoskeletal:no cyanosis of digits and no clubbing  PSYCH: alert & oriented x 3 with fluent speech NEURO: no focal motor/sensory deficits  LABORATORY DATA:  I have reviewed the data as listed Lab Results  Component Value Date   WBC 6.8 02/27/2021   HGB 10.6 (L) 02/27/2021   HCT 32.2 (L) 02/27/2021   MCV 84.8 02/27/2021   PLT 212.0 02/27/2021   Recent Labs    02/27/21 1430  NA 136  K 4.2  CL 104  CO2 27  GLUCOSE 94  BUN 11  CREATININE 0.87  CALCIUM 9.4    ASSESSMENT & PLAN:  Iron deficiency anemia The most likely cause of her anemia is due to chronic blood loss/malabsorption syndrome. We discussed some of the risks, benefits, and alternatives of intravenous iron infusions. The patient is symptomatic from anemia and the iron level is critically low. She tolerated oral iron supplement poorly and desires to  achieved higher levels of iron faster for adequate hematopoesis. Some of the side-effects to be expected including risks of infusion reactions, phlebitis, headaches, nausea and fatigue.  The patient is willing to proceed. Patient education material was dispensed.  Goal is to keep ferritin level greater than 50 and resolution of anemia I plan to schedule IV iron sucrose x3 She will continue folic acid supplementation   B12 deficiency anemia She has profound vitamin B12 deficiency I recommend weekly vitamin B12 injection x4 and then monthly  Folate deficiency anemia She will continue oral folate supplement  Vitamin D deficiency I reminded her the importance of vitamin D supplement  Anemia due to zinc deficiency She will continue oral zinc supplement I will also check copper level in her next visit   Orders Placed This Encounter  Procedures   Iron and TIBC    Standing Status:   Future    Standing Expiration Date:    03/12/2022   Vitamin B12    Standing Status:   Future    Standing Expiration Date:   03/12/2022   Ferritin    Standing Status:   Future    Standing Expiration Date:   03/12/2022   CBC with Differential/Platelet    Standing Status:   Future    Standing Expiration Date:   03/12/2022   Sedimentation rate    Standing Status:   Future    Standing Expiration Date:   03/12/2022   Copper, serum    Standing Status:   Standing    Number of Occurrences:   2    Standing Expiration Date:   03/12/2022    All questions were answered. The patient knows to call the clinic with any problems, questions or concerns. The total time spent in the appointment was 55 minutes encounter with patients including review of chart and various tests results, discussions about plan of care and coordination of care plan   Artis Delay, MD 03/12/2021 2:46 PM

## 2021-03-22 ENCOUNTER — Inpatient Hospital Stay: Payer: 59

## 2021-03-22 ENCOUNTER — Other Ambulatory Visit: Payer: Self-pay

## 2021-03-22 VITALS — BP 121/79 | HR 81 | Temp 98.2°F

## 2021-03-22 DIAGNOSIS — D500 Iron deficiency anemia secondary to blood loss (chronic): Secondary | ICD-10-CM | POA: Diagnosis not present

## 2021-03-22 DIAGNOSIS — D508 Other iron deficiency anemias: Secondary | ICD-10-CM

## 2021-03-22 DIAGNOSIS — D518 Other vitamin B12 deficiency anemias: Secondary | ICD-10-CM

## 2021-03-22 MED ORDER — SODIUM CHLORIDE 0.9 % IV SOLN
Freq: Once | INTRAVENOUS | Status: AC
  Filled 2021-03-22: qty 250

## 2021-03-22 MED ORDER — SODIUM CHLORIDE 0.9 % IV SOLN
300.0000 mg | Freq: Once | INTRAVENOUS | Status: AC
  Administered 2021-03-22: 300 mg via INTRAVENOUS
  Filled 2021-03-22: qty 300

## 2021-03-22 NOTE — Patient Instructions (Signed)

## 2021-03-27 ENCOUNTER — Inpatient Hospital Stay: Payer: 59

## 2021-03-27 ENCOUNTER — Other Ambulatory Visit: Payer: Self-pay

## 2021-03-27 VITALS — BP 132/84 | HR 98 | Temp 98.5°F | Resp 17

## 2021-03-27 DIAGNOSIS — D508 Other iron deficiency anemias: Secondary | ICD-10-CM

## 2021-03-27 DIAGNOSIS — D500 Iron deficiency anemia secondary to blood loss (chronic): Secondary | ICD-10-CM | POA: Diagnosis not present

## 2021-03-27 DIAGNOSIS — D518 Other vitamin B12 deficiency anemias: Secondary | ICD-10-CM

## 2021-03-27 MED ORDER — SODIUM CHLORIDE 0.9 % IV SOLN
Freq: Once | INTRAVENOUS | Status: AC
  Filled 2021-03-27: qty 250

## 2021-03-27 MED ORDER — SODIUM CHLORIDE 0.9 % IV SOLN
300.0000 mg | Freq: Once | INTRAVENOUS | Status: AC
  Administered 2021-03-27: 300 mg via INTRAVENOUS
  Filled 2021-03-27: qty 300

## 2021-03-27 NOTE — Patient Instructions (Signed)

## 2021-04-03 ENCOUNTER — Other Ambulatory Visit: Payer: Self-pay

## 2021-04-03 ENCOUNTER — Inpatient Hospital Stay: Payer: 59

## 2021-04-03 VITALS — BP 152/95 | HR 77 | Temp 97.9°F | Resp 17

## 2021-04-03 DIAGNOSIS — D518 Other vitamin B12 deficiency anemias: Secondary | ICD-10-CM

## 2021-04-03 DIAGNOSIS — D508 Other iron deficiency anemias: Secondary | ICD-10-CM

## 2021-04-03 DIAGNOSIS — D500 Iron deficiency anemia secondary to blood loss (chronic): Secondary | ICD-10-CM | POA: Diagnosis not present

## 2021-04-03 MED ORDER — SODIUM CHLORIDE 0.9 % IV SOLN
300.0000 mg | Freq: Once | INTRAVENOUS | Status: AC
  Administered 2021-04-03: 300 mg via INTRAVENOUS
  Filled 2021-04-03: qty 300

## 2021-04-03 MED ORDER — SODIUM CHLORIDE 0.9 % IV SOLN
Freq: Once | INTRAVENOUS | Status: AC
  Filled 2021-04-03: qty 250

## 2021-04-03 NOTE — Patient Instructions (Signed)

## 2021-06-10 ENCOUNTER — Inpatient Hospital Stay: Payer: 59 | Attending: Hematology and Oncology

## 2021-06-10 ENCOUNTER — Other Ambulatory Visit: Payer: Self-pay

## 2021-06-10 DIAGNOSIS — Z9884 Bariatric surgery status: Secondary | ICD-10-CM

## 2021-06-10 DIAGNOSIS — D500 Iron deficiency anemia secondary to blood loss (chronic): Secondary | ICD-10-CM | POA: Diagnosis present

## 2021-06-10 DIAGNOSIS — D508 Other iron deficiency anemias: Secondary | ICD-10-CM

## 2021-06-10 DIAGNOSIS — D518 Other vitamin B12 deficiency anemias: Secondary | ICD-10-CM

## 2021-06-10 DIAGNOSIS — D539 Nutritional anemia, unspecified: Secondary | ICD-10-CM

## 2021-06-10 DIAGNOSIS — D509 Iron deficiency anemia, unspecified: Secondary | ICD-10-CM | POA: Diagnosis not present

## 2021-06-10 LAB — CBC WITH DIFFERENTIAL/PLATELET
Abs Immature Granulocytes: 0.01 10*3/uL (ref 0.00–0.07)
Basophils Absolute: 0 10*3/uL (ref 0.0–0.1)
Basophils Relative: 0 %
Eosinophils Absolute: 0.1 10*3/uL (ref 0.0–0.5)
Eosinophils Relative: 2 %
HCT: 33.4 % — ABNORMAL LOW (ref 36.0–46.0)
Hemoglobin: 10.9 g/dL — ABNORMAL LOW (ref 12.0–15.0)
Immature Granulocytes: 0 %
Lymphocytes Relative: 42 %
Lymphs Abs: 2.4 10*3/uL (ref 0.7–4.0)
MCH: 28.4 pg (ref 26.0–34.0)
MCHC: 32.6 g/dL (ref 30.0–36.0)
MCV: 87 fL (ref 80.0–100.0)
Monocytes Absolute: 0.4 10*3/uL (ref 0.1–1.0)
Monocytes Relative: 7 %
Neutro Abs: 2.8 10*3/uL (ref 1.7–7.7)
Neutrophils Relative %: 49 %
Platelets: 212 10*3/uL (ref 150–400)
RBC: 3.84 MIL/uL — ABNORMAL LOW (ref 3.87–5.11)
RDW: 14.4 % (ref 11.5–15.5)
WBC: 5.8 10*3/uL (ref 4.0–10.5)
nRBC: 0 % (ref 0.0–0.2)

## 2021-06-10 LAB — IRON AND TIBC
Iron: 71 ug/dL (ref 41–142)
Saturation Ratios: 25 % (ref 21–57)
TIBC: 283 ug/dL (ref 236–444)
UIBC: 211 ug/dL (ref 120–384)

## 2021-06-10 LAB — SEDIMENTATION RATE: Sed Rate: 12 mm/hr (ref 0–22)

## 2021-06-10 LAB — VITAMIN B12: Vitamin B-12: 360 pg/mL (ref 180–914)

## 2021-06-10 LAB — FERRITIN: Ferritin: 103 ng/mL (ref 11–307)

## 2021-06-12 ENCOUNTER — Telehealth: Payer: 59 | Admitting: Hematology and Oncology

## 2021-06-12 LAB — COPPER, SERUM: Copper: 105 ug/dL (ref 80–158)

## 2021-06-13 ENCOUNTER — Other Ambulatory Visit: Payer: Self-pay

## 2021-06-13 ENCOUNTER — Encounter: Payer: Self-pay | Admitting: Hematology and Oncology

## 2021-06-13 ENCOUNTER — Telehealth: Payer: Self-pay | Admitting: Hematology and Oncology

## 2021-06-13 ENCOUNTER — Telehealth (HOSPITAL_BASED_OUTPATIENT_CLINIC_OR_DEPARTMENT_OTHER): Payer: 59 | Admitting: Hematology and Oncology

## 2021-06-13 DIAGNOSIS — D508 Other iron deficiency anemias: Secondary | ICD-10-CM | POA: Diagnosis not present

## 2021-06-13 DIAGNOSIS — D520 Dietary folate deficiency anemia: Secondary | ICD-10-CM

## 2021-06-13 DIAGNOSIS — Z9884 Bariatric surgery status: Secondary | ICD-10-CM | POA: Diagnosis not present

## 2021-06-13 DIAGNOSIS — D538 Other specified nutritional anemias: Secondary | ICD-10-CM

## 2021-06-13 DIAGNOSIS — E559 Vitamin D deficiency, unspecified: Secondary | ICD-10-CM

## 2021-06-13 DIAGNOSIS — D518 Other vitamin B12 deficiency anemias: Secondary | ICD-10-CM

## 2021-06-13 NOTE — Assessment & Plan Note (Signed)
She has history of folate deficiency and has not been taking folate consistently I think this contributes to persistent anemia and I reminded her to take folic acid supplement

## 2021-06-13 NOTE — Assessment & Plan Note (Signed)
Recent B12 level is adequate She will continue B12 replacement therapy

## 2021-06-13 NOTE — Telephone Encounter (Signed)
Per 10/6 sch msg, pt has been called and confirmed appts 

## 2021-06-13 NOTE — Assessment & Plan Note (Signed)
Her iron deficiency has improved with intravenous iron infusion However, given her gastric bypass status, she would be at risk of severe recurrent iron deficiency anemia I will check her iron studies again in 19-month

## 2021-06-13 NOTE — Assessment & Plan Note (Signed)
Vitamin D level is adequate She will continue vitamin D replacement therapy

## 2021-06-13 NOTE — Assessment & Plan Note (Signed)
She had history of zinc deficiency This was not checked recently I will check zinc level and copper level in the future

## 2021-06-13 NOTE — Progress Notes (Signed)
HEMATOLOGY-ONCOLOGY ELECTRONIC VISIT PROGRESS NOTE  Patient Care Team: Etta Grandchild, MD as PCP - Danella Maiers, MD as Consulting Physician (Hematology and Oncology) Waynard Reeds, MD as Consulting Physician (Obstetrics and Gynecology)  I connected with the patient via telephone call for virtual visit to review test results  ASSESSMENT & PLAN:  Iron deficiency anemia Her iron deficiency has improved with intravenous iron infusion However, given her gastric bypass status, she would be at risk of severe recurrent iron deficiency anemia I will check her iron studies again in 71-month  B12 deficiency anemia Recent B12 level is adequate She will continue B12 replacement therapy  Vitamin D deficiency Vitamin D level is adequate She will continue vitamin D replacement therapy  Anemia due to zinc deficiency She had history of zinc deficiency This was not checked recently I will check zinc level and copper level in the future  Folate deficiency anemia She has history of folate deficiency and has not been taking folate consistently I think this contributes to persistent anemia and I reminded her to take folic acid supplement  Orders Placed This Encounter  Procedures   Iron and TIBC    Standing Status:   Future    Standing Expiration Date:   06/13/2022   Ferritin    Standing Status:   Future    Standing Expiration Date:   06/13/2022   Folate RBC    Standing Status:   Future    Standing Expiration Date:   06/13/2022   Vitamin B12    Standing Status:   Future    Standing Expiration Date:   06/13/2022   CBC with Differential/Platelet    Standing Status:   Future    Standing Expiration Date:   06/13/2022   Zinc    Standing Status:   Future    Standing Expiration Date:   06/13/2022   VITAMIN D 25 Hydroxy (Vit-D Deficiency, Fractures)    Standing Status:   Future    Standing Expiration Date:   06/13/2022   Copper, serum    Standing Status:   Future    Standing Expiration  Date:   06/13/2022    INTERVAL HISTORY: Please see below for problem oriented charting. The purpose of today's virtual visit is to review recent test results She has background history of bariatric surgery leading to multiple mineral deficiencies She feels well She stated she has not been compliant taking zinc supplement and folic acid supplement  SUMMARY OF ONCOLOGIC HISTORY: Anna Nguyen 35 y.o. female is referred back to me for multiple mineral deficiencies.  She was last seen in May 2017 and then was lost to follow-up She is billed as a new patient today  The patient has history of polycystic ovarian syndrome and morbid obesity status post laparoscopic gastric banding procedure. She was found to have multiple mineral deficiency including iron deficiency, folate deficiency, zinc deficiency, B-12 deficiency and vitamin D deficiency since her gastric procedure in 2012.   I reviewed the patient's records and recent blood draw dated 02/27/2021 Her serum hemoglobin is 10.6 Iron is low at 39 Ferritin is low at 9.7 She has folate deficiency at 5.7 Vitamin D at 8.97 B12 at 98 and zinc at 47 The patient admits that she has not been compliant taking all her mineral supplementation She complained of excessive fatigue She denies excessive menorrhagia She received 3 doses of intravenous iron infusion in July 2022  REVIEW OF SYSTEMS:   Constitutional: Denies fevers, chills or abnormal weight loss Eyes:  Denies blurriness of vision Ears, nose, mouth, throat, and face: Denies mucositis or sore throat Respiratory: Denies cough, dyspnea or wheezes Cardiovascular: Denies palpitation, chest discomfort Gastrointestinal:  Denies nausea, heartburn or change in bowel habits Skin: Denies abnormal skin rashes Lymphatics: Denies new lymphadenopathy or easy bruising Neurological:Denies numbness, tingling or new weaknesses Behavioral/Psych: Mood is stable, no new changes  Extremities: No lower extremity  edema All other systems were reviewed with the patient and are negative.  I have reviewed the past medical history, past surgical history, social history and family history with the patient and they are unchanged from previous note.  ALLERGIES:  is allergic to aspirin, nsaids, and latex.  MEDICATIONS:  Current Outpatient Medications  Medication Sig Dispense Refill   Cholecalciferol 1.25 MG (50000 UT) capsule Take 1 capsule (50,000 Units total) by mouth once a week. 12 capsule 1   cyanocobalamin (,VITAMIN B-12,) 1000 MCG/ML injection Inject 1000 mcg (or 1 mg) subcutaneously once a week for 4 weeks and then monthly 10 mL 3   folic acid (FOLVITE) 1 MG tablet Take 1 tablet (1 mg total) by mouth daily. 90 tablet 1   methylphenidate (CONCERTA) 18 MG CR tablet Take 1 tablet (18 mg total) by mouth daily. 30 tablet 0   risperiDONE (RISPERDAL PO) Take by mouth.     zinc gluconate 50 MG tablet Take 1 tablet (50 mg total) by mouth daily. 90 tablet 1   No current facility-administered medications for this visit.    PHYSICAL EXAMINATION: ECOG PERFORMANCE STATUS: 0 - Asymptomatic  LABORATORY DATA:  I have reviewed the data as listed CMP Latest Ref Rng & Units 02/27/2021 06/22/2018 03/18/2017  Glucose 70 - 99 mg/dL 94 093(A) 355(D)  BUN 6 - 23 mg/dL 11 9 7   Creatinine 0.40 - 1.20 mg/dL 3.22 0.25  Sodium 135 - 145 mEq/L 136 140 133(L)  Potassium 3.5 - 5.1 mEq/L 4.2 4.0 3.3(L)  Chloride 96 - 112 mEq/L 104 106 103  CO2 19 - 32 mEq/L 27 27 22   Calcium 8.4 - 10.5 mg/dL 9.4 9.1 9.1  Total Protein 6.5 - 8.1 g/dL - - 7.9  Total Bilirubin 0.3 - 1.2 mg/dL - - 0.4  Alkaline Phos 38 - 126 U/L - - 75  AST 15 - 41 U/L - - 12(L)  ALT 14 - 54 U/L - - 9(L)    Lab Results  Component Value Date   WBC 5.8 06/10/2021   HGB 10.9 (L) 06/10/2021   HCT 33.4 (L) 06/10/2021   MCV 87.0 06/10/2021   PLT 212 06/10/2021   NEUTROABS 2.8 06/10/2021    I discussed the assessment and treatment plan with the  patient. The patient was provided an opportunity to ask questions and all were answered. The patient agreed with the plan and demonstrated an understanding of the instructions. The patient was advised to call back or seek an in-person evaluation if the symptoms worsen or if the condition fails to improve as anticipated.    I spent 20 minutes for the appointment reviewing test results, discuss management and coordination of care.  08/10/2021, MD 06/13/2021 10:22 AM

## 2021-09-06 ENCOUNTER — Inpatient Hospital Stay: Payer: 59 | Attending: Hematology and Oncology

## 2021-09-06 ENCOUNTER — Other Ambulatory Visit: Payer: Self-pay

## 2021-09-06 DIAGNOSIS — E559 Vitamin D deficiency, unspecified: Secondary | ICD-10-CM | POA: Insufficient documentation

## 2021-09-06 DIAGNOSIS — Z9884 Bariatric surgery status: Secondary | ICD-10-CM | POA: Diagnosis not present

## 2021-09-06 DIAGNOSIS — D508 Other iron deficiency anemias: Secondary | ICD-10-CM

## 2021-09-06 DIAGNOSIS — D520 Dietary folate deficiency anemia: Secondary | ICD-10-CM | POA: Diagnosis present

## 2021-09-06 DIAGNOSIS — D518 Other vitamin B12 deficiency anemias: Secondary | ICD-10-CM

## 2021-09-06 DIAGNOSIS — D538 Other specified nutritional anemias: Secondary | ICD-10-CM

## 2021-09-06 LAB — CBC WITH DIFFERENTIAL/PLATELET
Abs Immature Granulocytes: 0.01 10*3/uL (ref 0.00–0.07)
Basophils Absolute: 0 10*3/uL (ref 0.0–0.1)
Basophils Relative: 1 %
Eosinophils Absolute: 0.1 10*3/uL (ref 0.0–0.5)
Eosinophils Relative: 1 %
HCT: 37.7 % (ref 36.0–46.0)
Hemoglobin: 12.1 g/dL (ref 12.0–15.0)
Immature Granulocytes: 0 %
Lymphocytes Relative: 43 %
Lymphs Abs: 2.7 10*3/uL (ref 0.7–4.0)
MCH: 27.9 pg (ref 26.0–34.0)
MCHC: 32.1 g/dL (ref 30.0–36.0)
MCV: 86.9 fL (ref 80.0–100.0)
Monocytes Absolute: 0.4 10*3/uL (ref 0.1–1.0)
Monocytes Relative: 6 %
Neutro Abs: 3.1 10*3/uL (ref 1.7–7.7)
Neutrophils Relative %: 49 %
Platelets: 253 10*3/uL (ref 150–400)
RBC: 4.34 MIL/uL (ref 3.87–5.11)
RDW: 13.3 % (ref 11.5–15.5)
WBC: 6.4 10*3/uL (ref 4.0–10.5)
nRBC: 0 % (ref 0.0–0.2)

## 2021-09-06 LAB — VITAMIN D 25 HYDROXY (VIT D DEFICIENCY, FRACTURES): Vit D, 25-Hydroxy: 24.66 ng/mL — ABNORMAL LOW (ref 30–100)

## 2021-09-06 LAB — VITAMIN B12: Vitamin B-12: 271 pg/mL (ref 180–914)

## 2021-09-06 LAB — IRON AND IRON BINDING CAPACITY (CC-WL,HP ONLY)
Iron: 33 ug/dL (ref 28–170)
Saturation Ratios: 11 % (ref 10.4–31.8)
TIBC: 313 ug/dL (ref 250–450)
UIBC: 280 ug/dL

## 2021-09-06 LAB — FERRITIN: Ferritin: 110 ng/mL (ref 11–307)

## 2021-09-10 LAB — COPPER, SERUM: Copper: 149 ug/dL (ref 80–158)

## 2021-09-11 LAB — FOLATE RBC
Folate, Hemolysate: 280 ng/mL
Folate, RBC: 745 ng/mL (ref >498)
Hematocrit: 37.6 % (ref 34.0–46.6)

## 2021-09-12 LAB — ZINC: Zinc: 78 ug/dL (ref 44–115)

## 2021-09-13 ENCOUNTER — Other Ambulatory Visit: Payer: Self-pay

## 2021-09-13 ENCOUNTER — Encounter: Payer: Self-pay | Admitting: Hematology and Oncology

## 2021-09-13 ENCOUNTER — Inpatient Hospital Stay: Payer: 59 | Attending: Hematology and Oncology | Admitting: Hematology and Oncology

## 2021-09-13 DIAGNOSIS — E559 Vitamin D deficiency, unspecified: Secondary | ICD-10-CM | POA: Diagnosis not present

## 2021-09-13 DIAGNOSIS — D508 Other iron deficiency anemias: Secondary | ICD-10-CM

## 2021-09-13 DIAGNOSIS — Z9884 Bariatric surgery status: Secondary | ICD-10-CM | POA: Diagnosis not present

## 2021-09-13 DIAGNOSIS — D538 Other specified nutritional anemias: Secondary | ICD-10-CM

## 2021-09-13 DIAGNOSIS — D518 Other vitamin B12 deficiency anemias: Secondary | ICD-10-CM

## 2021-09-13 DIAGNOSIS — D520 Dietary folate deficiency anemia: Secondary | ICD-10-CM | POA: Insufficient documentation

## 2021-09-13 MED ORDER — CHOLECALCIFEROL 1.25 MG (50000 UT) PO CAPS: 50000.0000 [IU] | ORAL_CAPSULE | ORAL | 1 refills | Status: DC

## 2021-09-13 MED ORDER — CYANOCOBALAMIN 1000 MCG/ML IJ SOLN: INTRAMUSCULAR | 3 refills | Status: DC

## 2021-09-13 NOTE — Progress Notes (Signed)
HEMATOLOGY-ONCOLOGY ELECTRONIC VISIT PROGRESS NOTE  Patient Care Team: Etta Grandchild, MD as PCP - General Artis Delay, MD as Consulting Physician (Hematology and Oncology) Waynard Reeds, MD as Consulting Physician (Obstetrics and Gynecology)  I connected with the patient via telephone conference and verified that I am speaking with the correct person using two identifiers. The patient's location is at home and I am providing care from the [facility] I discussed the limitations, risks, security and privacy concerns of performing an evaluation and management service by e-visits and the availability of in person appointments.  I also discussed with the patient that there may be a patient responsible charge related to this service. The patient expressed understanding and agreed to proceed.   ASSESSMENT & PLAN:  B12 deficiency anemia She has borderline low B12 level I recommend her to continue on B12 injection once a month  Iron deficiency anemia Her iron studies are adequate We will continue to monitor carefully  Vitamin D deficiency I recommend resumption of high-dose vitamin D replacement therapy and she is in agreement  Anemia due to zinc deficiency Her serum level is adequate She will continue zinc replacement therapy  Orders Placed This Encounter  Procedures   CBC with Differential (Cancer Center Only)    Standing Status:   Future    Standing Expiration Date:   09/13/2022   Ferritin    Standing Status:   Future    Standing Expiration Date:   09/13/2022   Iron and Iron Binding Capacity ([facility] only)    Standing Status:   Future    Standing Expiration Date:   09/13/2022   Vitamin B12    Standing Status:   Future    Standing Expiration Date:   09/13/2022   Copper, serum    Standing Status:   Future    Standing Expiration Date:   09/13/2022   Zinc    Standing Status:   Future    Standing Expiration Date:   09/13/2022   VITAMIN D 25 Hydroxy (Vit-D Deficiency, Fractures)     Standing Status:   Future    Standing Expiration Date:   09/13/2022    INTERVAL HISTORY: Please see below for problem oriented charting. The purpose of today's discussion is to review test results The patient have history of gastric bypass surgery causing multiple mineral deficiencies The patient ran out of vitamin D supplement recently She stopped giving herself B12 injection over a month ago due to large needles supplied by her pharmacy for B12 injections Her energy level is fair  SUMMARY OF HEMATOLOGIC HISTORY: Anna Nguyen 36 y.o. female is referred back to me for multiple mineral deficiencies.  She was last seen in May 2017 and then was lost to follow-up She is billed as a new patient today  The patient has history of polycystic ovarian syndrome and morbid obesity status post laparoscopic gastric banding procedure. She was found to have multiple mineral deficiency including iron deficiency, folate deficiency, zinc deficiency, B-12 deficiency and vitamin D deficiency since her gastric procedure in 2012.   I reviewed the patient's records and recent blood draw dated 02/27/2021 Her serum hemoglobin is 10.6 Iron is low at 39 Ferritin is low at 9.7 She has folate deficiency at 5.7 Vitamin D at 8.97 B12 at 98 and zinc at 47 The patient admits that she has not been compliant taking all her mineral supplementation She complained of excessive fatigue She denies excessive menorrhagia She received 3 doses of intravenous iron infusion in  July 2022  REVIEW OF SYSTEMS:   Constitutional: Denies fevers, chills or abnormal weight loss Eyes: Denies blurriness of vision Ears, nose, mouth, throat, and face: Denies mucositis or sore throat Respiratory: Denies cough, dyspnea or wheezes Cardiovascular: Denies palpitation, chest discomfort Gastrointestinal:  Denies nausea, heartburn or change in bowel habits Skin: Denies abnormal skin rashes Lymphatics: Denies new lymphadenopathy or easy  bruising Neurological:Denies numbness, tingling or new weaknesses Behavioral/Psych: Mood is stable, no new changes  Extremities: No lower extremity edema All other systems were reviewed with the patient and are negative.  I have reviewed the past medical history, past surgical history, social history and family history with the patient and they are unchanged from previous note.  ALLERGIES:  is allergic to aspirin, nsaids, and latex.  MEDICATIONS:  Current Outpatient Medications  Medication Sig Dispense Refill   Cholecalciferol 1.25 MG (50000 UT) capsule Take 1 capsule (50,000 Units total) by mouth once a week. 12 capsule 1   cyanocobalamin (,VITAMIN B-12,) 1000 MCG/ML injection Inject 1000 mcg (or 1 mg) subcutaneously once a week for 4 weeks and then monthly 10 mL 3   folic acid (FOLVITE) 1 MG tablet Take 1 tablet (1 mg total) by mouth daily. 90 tablet 1   methylphenidate (CONCERTA) 18 MG CR tablet Take 1 tablet (18 mg total) by mouth daily. 30 tablet 0   risperiDONE (RISPERDAL PO) Take by mouth.     zinc gluconate 50 MG tablet Take 1 tablet (50 mg total) by mouth daily. 90 tablet 1   No current facility-administered medications for this visit.    PHYSICAL EXAMINATION: ECOG PERFORMANCE STATUS: 1 - Symptomatic but completely ambulatory  LABORATORY DATA:  I have reviewed the data as listed CMP Latest Ref Rng & Units 02/27/2021 06/22/2018 03/18/2017  Glucose 70 - 99 mg/dL 94 468(E) 321(Y)  BUN 6 - 23 mg/dL 11 9 7   Creatinine 0.40 - 1.20 mg/dL 2.48 2.50  Sodium 135 - 145 mEq/L 136 140 133(L)  Potassium 3.5 - 5.1 mEq/L 4.2 4.0 3.3(L)  Chloride 96 - 112 mEq/L 104 106 103  CO2 19 - 32 mEq/L 27 27 22   Calcium 8.4 - 10.5 mg/dL 9.4 9.1 9.1  Total Protein 6.5 - 8.1 g/dL - - 7.9  Total Bilirubin 0.3 - 1.2 mg/dL - - 0.4  Alkaline Phos 38 - 126 U/L - - 75  AST 15 - 41 U/L - - 12(L)  ALT 14 - 54 U/L - - 9(L)    Lab Results  Component Value Date   WBC 6.4 09/06/2021   HGB 12.1  09/06/2021   HCT 37.7 09/06/2021   HCT 37.6 09/06/2021   MCV 86.9 09/06/2021   PLT 253 09/06/2021   NEUTROABS 3.1 09/06/2021    I discussed the assessment and treatment plan with the patient. The patient was provided an opportunity to ask questions and all were answered. The patient agreed with the plan and demonstrated an understanding of the instructions. The patient was advised to call back or seek an in-person evaluation if the symptoms worsen or if the condition fails to improve as anticipated.    I spent 25 minutes for the appointment reviewing test results, discuss management and coordination of care.  09/08/2021, MD 09/13/2021 2:46 PM

## 2021-09-13 NOTE — Assessment & Plan Note (Signed)
Her serum level is adequate She will continue zinc replacement therapy

## 2021-09-13 NOTE — Assessment & Plan Note (Signed)
Her iron studies are adequate We will continue to monitor carefully

## 2021-09-13 NOTE — Assessment & Plan Note (Signed)
I recommend resumption of high-dose vitamin D replacement therapy and she is in agreement

## 2021-09-13 NOTE — Assessment & Plan Note (Signed)
She has borderline low B12 level I recommend her to continue on B12 injection once a month

## 2022-01-10 ENCOUNTER — Inpatient Hospital Stay: Payer: 59 | Attending: Hematology and Oncology

## 2022-01-17 ENCOUNTER — Telehealth: Payer: Self-pay

## 2022-01-17 ENCOUNTER — Inpatient Hospital Stay: Payer: 59 | Admitting: Hematology and Oncology

## 2022-01-17 ENCOUNTER — Encounter: Payer: Self-pay | Admitting: Hematology and Oncology

## 2022-01-17 NOTE — Telephone Encounter (Signed)
Called and left a message. Today's appt with Dr. Bertis Ruddy for a virtual visit is being canceled due to her not getting labs on 5/5. Ask her to call the office back to reschedule. ?

## 2022-05-02 DIAGNOSIS — B9689 Other specified bacterial agents as the cause of diseases classified elsewhere: Secondary | ICD-10-CM | POA: Insufficient documentation

## 2022-06-09 ENCOUNTER — Emergency Department (HOSPITAL_COMMUNITY)
Admission: EM | Admit: 2022-06-09 | Discharge: 2022-06-10 | Disposition: A | Discharge status: Other Acute Inpatient Hospital | Payer: 59 | Source: Home / Self Care | Attending: Emergency Medicine | Admitting: Emergency Medicine

## 2022-06-09 ENCOUNTER — Encounter (HOSPITAL_COMMUNITY): Payer: Self-pay

## 2022-06-09 ENCOUNTER — Other Ambulatory Visit: Payer: Self-pay

## 2022-06-09 DIAGNOSIS — Z20822 Contact with and (suspected) exposure to covid-19: Secondary | ICD-10-CM | POA: Insufficient documentation

## 2022-06-09 DIAGNOSIS — Z91040 Latex allergy status: Secondary | ICD-10-CM | POA: Insufficient documentation

## 2022-06-09 DIAGNOSIS — F32A Depression, unspecified: Secondary | ICD-10-CM | POA: Insufficient documentation

## 2022-06-09 DIAGNOSIS — F332 Major depressive disorder, recurrent severe without psychotic features: Secondary | ICD-10-CM | POA: Diagnosis not present

## 2022-06-09 DIAGNOSIS — R45851 Suicidal ideations: Secondary | ICD-10-CM | POA: Diagnosis not present

## 2022-06-09 LAB — CBC
HCT: 33.7 % — ABNORMAL LOW (ref 36.0–46.0)
Hemoglobin: 10.7 g/dL — ABNORMAL LOW (ref 12.0–15.0)
MCH: 28.3 pg (ref 26.0–34.0)
MCHC: 31.8 g/dL (ref 30.0–36.0)
MCV: 89.2 fL (ref 80.0–100.0)
Platelets: 200 10*3/uL (ref 150–400)
RBC: 3.78 MIL/uL — ABNORMAL LOW (ref 3.87–5.11)
RDW: 13.9 % (ref 11.5–15.5)
WBC: 6 10*3/uL (ref 4.0–10.5)
nRBC: 0 % (ref 0.0–0.2)

## 2022-06-09 LAB — SALICYLATE LEVEL: Salicylate Lvl: <7 mg/dL — ABNORMAL LOW (ref 7.0–30.0)

## 2022-06-09 LAB — COMPREHENSIVE METABOLIC PANEL
ALT: 12 U/L (ref 0–44)
AST: 13 U/L — ABNORMAL LOW (ref 15–41)
Albumin: 3.8 g/dL (ref 3.5–5.0)
Alkaline Phosphatase: 45 U/L (ref 38–126)
Anion gap: 7 (ref 5–15)
BUN: 7 mg/dL (ref 6–20)
CO2: 23 mmol/L (ref 22–32)
Calcium: 9.3 mg/dL (ref 8.9–10.3)
Chloride: 108 mmol/L (ref 98–111)
Creatinine, Ser: 0.98 mg/dL (ref 0.44–1.00)
GFR, Estimated: >60 mL/min (ref >60)
Glucose, Bld: 114 mg/dL — ABNORMAL HIGH (ref 70–99)
Potassium: 3.9 mmol/L (ref 3.5–5.1)
Sodium: 138 mmol/L (ref 135–145)
Total Bilirubin: 0.4 mg/dL (ref 0.3–1.2)
Total Protein: 7.2 g/dL (ref 6.5–8.1)

## 2022-06-09 LAB — I-STAT BETA HCG BLOOD, ED (MC, WL, AP ONLY): I-stat hCG, quantitative: <5 m[IU]/mL (ref <5)

## 2022-06-09 LAB — RAPID URINE DRUG SCREEN, HOSP PERFORMED
Amphetamines: NOT DETECTED
Barbiturates: NOT DETECTED
Benzodiazepines: POSITIVE — AB
Cocaine: NOT DETECTED
Opiates: NOT DETECTED
Tetrahydrocannabinol: POSITIVE — AB

## 2022-06-09 LAB — ACETAMINOPHEN LEVEL: Acetaminophen (Tylenol), Serum: <10 ug/mL — ABNORMAL LOW (ref 10–30)

## 2022-06-09 LAB — ETHANOL: Alcohol, Ethyl (B): <10 mg/dL (ref <10)

## 2022-06-09 MED ORDER — METHYLPHENIDATE HCL ER (OSM) 18 MG PO TBCR: 18.0000 mg | EXTENDED_RELEASE_TABLET | Freq: Every day | ORAL | Status: DC

## 2022-06-09 MED ORDER — RISPERIDONE 2 MG PO TABS: 2.0000 mg | ORAL_TABLET | Freq: Every day | ORAL | Status: DC

## 2022-06-09 MED ORDER — FOLIC ACID 1 MG PO TABS
1.0000 mg | ORAL_TABLET | Freq: Every day | ORAL | Status: DC
  Administered 2022-06-10: 1 mg via ORAL
  Filled 2022-06-09: qty 1

## 2022-06-09 MED ORDER — CHOLECALCIFEROL 1.25 MG (50000 UT) PO CAPS: 50000.0000 [IU] | ORAL_CAPSULE | ORAL | Status: DC
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
## 2022-06-09 NOTE — ED Triage Notes (Signed)
Patient arrives from home with husband. Husband states patient has been acting erratic. Patient also made a concerning post on social medica concerning her life. Patient had a bottle of alprazolam 0.5mg  filled on 9/30 (30 pills). Bottle is empty. Husband is unclear as to what happened to the pills. Patients states she spilled them and cleaned them up. Patient is visibly agitated in triage. Unable to sit down. Patient keeps moving, picking up things and has moments of standing still in a daze.

## 2022-06-09 NOTE — ED Provider Notes (Signed)
  Provider Note MRN:  [id number]  Arrival date & time: 06/10/22    ED Course and Medical Decision Making  Assumed care from [facility] at shift change.  See note from prior team for complete details, in brief:   36 yo female To ED with mental health concern/abnormal behavior  Concern for missing alprazolam, pt says she spilled them; husband concerned she ingested them; pt denies this Waiting for TTS She is under IVC  Plan per prior physician TTS eval   TTS has evaluated the patient, recommends inpatient treatment.  Patient placed under provider to follow-up.  She is under IVC.  Home medications ordered.  She is HDS  She was medically cleared by prior team.   Pending placement per SW/psychiatry team.   Procedures  Final Clinical Impressions(s) / ED Diagnoses     ICD-10-CM   1. Depression, unspecified depression type  F32.A       ED Discharge Orders     None       Discharge Instructions   None        Jeanell Sparrow, DO 06/10/22 3220

## 2022-06-10 ENCOUNTER — Inpatient Hospital Stay (HOSPITAL_COMMUNITY)
Admission: AD | Admit: 2022-06-10 | Discharge: 2022-06-13 | DRG: 885 | Disposition: A | Discharge status: Home / Self Care | Payer: 59 | Source: Intra-hospital | Attending: Psychiatry | Admitting: Psychiatry

## 2022-06-10 ENCOUNTER — Other Ambulatory Visit: Payer: Self-pay

## 2022-06-10 ENCOUNTER — Encounter (HOSPITAL_COMMUNITY): Payer: Self-pay | Admitting: Nurse Practitioner

## 2022-06-10 DIAGNOSIS — F909 Attention-deficit hyperactivity disorder, unspecified type: Secondary | ICD-10-CM | POA: Diagnosis present

## 2022-06-10 DIAGNOSIS — F4310 Post-traumatic stress disorder, unspecified: Secondary | ICD-10-CM | POA: Diagnosis present

## 2022-06-10 DIAGNOSIS — F411 Generalized anxiety disorder: Secondary | ICD-10-CM | POA: Clinically undetermined

## 2022-06-10 DIAGNOSIS — E282 Polycystic ovarian syndrome: Secondary | ICD-10-CM | POA: Diagnosis present

## 2022-06-10 DIAGNOSIS — F333 Major depressive disorder, recurrent, severe with psychotic symptoms: Secondary | ICD-10-CM | POA: Diagnosis not present

## 2022-06-10 DIAGNOSIS — G4700 Insomnia, unspecified: Secondary | ICD-10-CM | POA: Diagnosis present

## 2022-06-10 DIAGNOSIS — Z818 Family history of other mental and behavioral disorders: Secondary | ICD-10-CM | POA: Diagnosis not present

## 2022-06-10 DIAGNOSIS — F332 Major depressive disorder, recurrent severe without psychotic features: Secondary | ICD-10-CM | POA: Diagnosis present

## 2022-06-10 DIAGNOSIS — E119 Type 2 diabetes mellitus without complications: Secondary | ICD-10-CM | POA: Diagnosis present

## 2022-06-10 DIAGNOSIS — Z9884 Bariatric surgery status: Secondary | ICD-10-CM | POA: Diagnosis not present

## 2022-06-10 DIAGNOSIS — Z20822 Contact with and (suspected) exposure to covid-19: Secondary | ICD-10-CM | POA: Diagnosis present

## 2022-06-10 DIAGNOSIS — I10 Essential (primary) hypertension: Secondary | ICD-10-CM | POA: Diagnosis present

## 2022-06-10 DIAGNOSIS — N926 Irregular menstruation, unspecified: Secondary | ICD-10-CM | POA: Diagnosis present

## 2022-06-10 DIAGNOSIS — Z79899 Other long term (current) drug therapy: Secondary | ICD-10-CM

## 2022-06-10 DIAGNOSIS — F900 Attention-deficit hyperactivity disorder, predominantly inattentive type: Secondary | ICD-10-CM | POA: Diagnosis present

## 2022-06-10 DIAGNOSIS — R45851 Suicidal ideations: Secondary | ICD-10-CM | POA: Diagnosis present

## 2022-06-10 DIAGNOSIS — F3281 Premenstrual dysphoric disorder: Secondary | ICD-10-CM | POA: Clinically undetermined

## 2022-06-10 LAB — RESP PANEL BY RT-PCR (FLU A&B, COVID) ARPGX2
Influenza A by PCR: NEGATIVE
Influenza B by PCR: NEGATIVE
SARS Coronavirus 2 by RT PCR: NEGATIVE

## 2022-06-10 MED ORDER — ZOLPIDEM TARTRATE 5 MG PO TABS
5.0000 mg | ORAL_TABLET | Freq: Once | ORAL | Status: AC | PRN
  Administered 2022-06-10: 5 mg via ORAL
  Filled 2022-06-10: qty 1

## 2022-06-10 MED ORDER — ALBUTEROL SULFATE HFA 108 (90 BASE) MCG/ACT IN AERS: 2.0000 | INHALATION_SPRAY | Freq: Four times a day (QID) | RESPIRATORY_TRACT | Status: DC | PRN

## 2022-06-10 MED ORDER — RISPERIDONE 2 MG PO TABS
2.0000 mg | ORAL_TABLET | Freq: Every day | ORAL | Status: DC
  Filled 2022-06-10: qty 1

## 2022-06-10 MED ORDER — NORETHIN ACE-ETH ESTRAD-FE 1-20 MG-MCG(24) PO TABS: 1.0000 | ORAL_TABLET | Freq: Every day | ORAL | Status: DC

## 2022-06-10 MED ORDER — AMPHETAMINE-DEXTROAMPHETAMINE 10 MG PO TABS: 15.0000 mg | ORAL_TABLET | Freq: Two times a day (BID) | ORAL | Status: DC | PRN

## 2022-06-10 MED ORDER — RISPERIDONE 1 MG PO TABS
1.0000 mg | ORAL_TABLET | Freq: Every day | ORAL | Status: DC
  Administered 2022-06-10 – 2022-06-12 (×3): 1 mg via ORAL
  Filled 2022-06-10 (×6): qty 1

## 2022-06-10 MED ORDER — RISPERIDONE 2 MG PO TABS: 2.0000 mg | ORAL_TABLET | Freq: Every day | ORAL | Status: DC

## 2022-06-10 MED ORDER — ALUM & MAG HYDROXIDE-SIMETH 200-200-20 MG/5ML PO SUSP
30.0000 mL | ORAL | Status: DC | PRN
  Administered 2022-06-12: 30 mL via ORAL
  Filled 2022-06-10: qty 30

## 2022-06-10 MED ORDER — LAMOTRIGINE 25 MG PO TABS
150.0000 mg | ORAL_TABLET | Freq: Every day | ORAL | Status: DC
  Administered 2022-06-10: 150 mg via ORAL
  Filled 2022-06-10: qty 2

## 2022-06-10 MED ORDER — FLUOXETINE HCL 20 MG PO CAPS
20.0000 mg | ORAL_CAPSULE | Freq: Every day | ORAL | Status: DC
  Administered 2022-06-11: 20 mg via ORAL
  Filled 2022-06-10 (×2): qty 1

## 2022-06-10 MED ORDER — HYDROXYZINE HCL 25 MG PO TABS
25.0000 mg | ORAL_TABLET | Freq: Three times a day (TID) | ORAL | Status: DC | PRN
  Administered 2022-06-10 – 2022-06-11 (×2): 25 mg via ORAL
  Filled 2022-06-10 (×2): qty 1

## 2022-06-10 MED ORDER — ONDANSETRON 4 MG PO TBDP: 4.0000 mg | ORAL_TABLET | Freq: Four times a day (QID) | ORAL | Status: DC | PRN

## 2022-06-10 MED ORDER — RISPERIDONE 2 MG PO TABS
2.0000 mg | ORAL_TABLET | Freq: Every day | ORAL | Status: DC
  Administered 2022-06-10: 2 mg via ORAL
  Filled 2022-06-10: qty 1

## 2022-06-10 MED ORDER — FOLIC ACID 1 MG PO TABS
1.0000 mg | ORAL_TABLET | Freq: Every day | ORAL | Status: DC
  Administered 2022-06-11 – 2022-06-13 (×3): 1 mg via ORAL
  Filled 2022-06-10 (×5): qty 1

## 2022-06-10 MED ORDER — AMPHETAMINE-DEXTROAMPHETAMINE 5 MG PO TABS: 15.0000 mg | ORAL_TABLET | Freq: Two times a day (BID) | ORAL | Status: DC | PRN

## 2022-06-10 MED ORDER — ACETAMINOPHEN 325 MG PO TABS
650.0000 mg | ORAL_TABLET | Freq: Four times a day (QID) | ORAL | Status: DC | PRN
  Administered 2022-06-11: 650 mg via ORAL
  Filled 2022-06-10: qty 2

## 2022-06-10 MED ORDER — HYDROXYZINE HCL 25 MG PO TABS: 25.0000 mg | ORAL_TABLET | Freq: Four times a day (QID) | ORAL | Status: DC | PRN

## 2022-06-10 MED ORDER — LORAZEPAM 1 MG PO TABS: 1.0000 mg | ORAL_TABLET | Freq: Four times a day (QID) | ORAL | Status: DC | PRN

## 2022-06-10 MED ORDER — LAMOTRIGINE 150 MG PO TABS
150.0000 mg | ORAL_TABLET | Freq: Every day | ORAL | Status: DC
  Filled 2022-06-10: qty 1

## 2022-06-10 MED ORDER — ZOLPIDEM TARTRATE 5 MG PO TABS: 5.0000 mg | ORAL_TABLET | Freq: Every evening | ORAL | Status: DC | PRN

## 2022-06-10 MED ORDER — TRAZODONE HCL 50 MG PO TABS
50.0000 mg | ORAL_TABLET | Freq: Every evening | ORAL | Status: DC | PRN
  Administered 2022-06-10 – 2022-06-12 (×3): 50 mg via ORAL
  Filled 2022-06-10 (×3): qty 1

## 2022-06-10 MED ORDER — LAMOTRIGINE 150 MG PO TABS
150.0000 mg | ORAL_TABLET | Freq: Every day | ORAL | Status: DC
  Administered 2022-06-10 – 2022-06-12 (×3): 150 mg via ORAL
  Filled 2022-06-10 (×6): qty 1

## 2022-06-10 MED ORDER — LOPERAMIDE HCL 2 MG PO CAPS: 2.0000 mg | ORAL_CAPSULE | ORAL | Status: DC | PRN

## 2022-06-10 MED ORDER — MAGNESIUM HYDROXIDE 400 MG/5ML PO SUSP: 30.0000 mL | Freq: Every day | ORAL | Status: DC | PRN

## 2022-06-10 MED ORDER — FLUOXETINE HCL 10 MG PO CAPS
10.0000 mg | ORAL_CAPSULE | Freq: Every day | ORAL | Status: AC
  Administered 2022-06-10: 10 mg via ORAL
  Filled 2022-06-10 (×2): qty 1

## 2022-06-10 MED ORDER — ADULT MULTIVITAMIN W/MINERALS CH
1.0000 | ORAL_TABLET | Freq: Every day | ORAL | Status: DC
  Administered 2022-06-10 – 2022-06-13 (×4): 1 via ORAL
  Filled 2022-06-10 (×6): qty 1

## 2022-06-10 NOTE — Plan of Care (Signed)
  Problem: Education: Goal: Knowledge of [facility] Education information/materials will improve Outcome: Progressing Goal: Emotional status will improve Outcome: Progressing Goal: Mental status will improve Outcome: Progressing Goal: Verbalization of understanding the information provided will improve Outcome: Progressing   

## 2022-06-10 NOTE — ED Notes (Signed)
Patient preadmit to 405-1. Accepting is Charmaine Downs NP. Attending is Massengill MD. Dx MDD.

## 2022-06-10 NOTE — Progress Notes (Signed)
Adult Psychoeducational Group Note  Date:  06/10/2022 Time:  8:24 PM  Group Topic/Focus:  Wrap-Up Group:   The focus of this group is to help patients review their daily goal of treatment and discuss progress on daily workbooks.  Participation Level:  Active  Participation Quality:  Appropriate  Affect:  Appropriate  Cognitive:  Appropriate  Insight: Appropriate  Engagement in Group:  Engaged  Modes of Intervention:  Discussion  Additional Comments:  Pt stated she was admitted to the hospital today.  Pt stated she thinks  she needs to be here, but is not sure why she is here.  Tonia Brooms D 06/10/2022, 8:24 PM
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
## 2022-06-10 NOTE — Progress Notes (Signed)
Pt was accepted to [facility] 06/10/22; Bed Assignment 405-1   Pt meets inpatient criteria per Charmaine Downs, NP  Attending Physician will be Dr. Janine Limbo  Report can be called to: Adult unit: [phone]  Pt can arrive after IVC paperwork is received [facility] [place] will coordniate   Care Team notified: [facility] Lynnda Shields, RN, Charmaine Downs, NP, Alen Bleacher, NT, Tenny Craw, RN, Crecencio Mc, RN, Ruben Im, RN.  Nadara Mode, LCSWA 06/10/2022 @ 11:45 AM

## 2022-06-10 NOTE — H&P (Cosign Needed Addendum)
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  Physician Treatment Plan for Secondary Diagnosis: Principal Problem:   MDD (major depressive disorder), recurrent episode, severe (HCC)  Long Term Goal(s): Improvement in symptoms so as ready for discharge  Short Term Goals: Ability to identify changes in lifestyle to reduce recurrence of condition will improve, Ability to verbalize feelings will improve, Ability to disclose and discuss suicidal ideas, Ability to  demonstrate self-control will improve, Ability to identify and develop effective coping behaviors will improve, Ability to maintain clinical measurements within normal limits will improve, Compliance with prescribed medications will improve, and Ability to identify triggers associated with substance abuse/mental health issues will improve  I certify that inpatient services furnished can reasonably be expected to improve the patient's condition.    Loletta ParishBrooke A Leevy-Johnson, NP 10/3/20233:08 PM

## 2022-06-10 NOTE — Progress Notes (Signed)
   06/10/22 2100  Psych Admission Type (Psych Patients Only)  Admission Status Involuntary  Psychosocial Assessment  Patient Complaints None  Eye Contact Fair  Facial Expression Anxious  Affect Anxious  Speech Logical/coherent  Interaction Assertive  Motor Activity Slow  Appearance/Hygiene Unremarkable  Behavior Characteristics Cooperative;Appropriate to situation  Mood Anxious  Thought Process  Coherency Circumstantial  Content Blaming self  Delusions None reported or observed  Perception WDL  Hallucination None reported or observed  Judgment Impaired  Confusion None  Danger to Self  Current suicidal ideation? Denies  Danger to Others  Danger to Others None reported or observed

## 2022-06-10 NOTE — BH Assessment (Signed)
Per [facility], pt has a room at [facility] once pt is medically clear.  Morning AC will coordinate after discharges.

## 2022-06-10 NOTE — Tx Team (Signed)
Initial Treatment Plan 06/10/2022 1:43 PM Anna Nguyen [id number]    PATIENT STRESSORS: Financial difficulties   Occupational concerns   Other: Overwhelm from multiple stressors.     PATIENT STRENGTHS: Ability for insight  Average or above average intelligence  Communication skills  General fund of knowledge  Motivation for treatment/growth  Supportive family/friends    PATIENT IDENTIFIED PROBLEMS: Coping skills for anxiety.  "I have been overwhelmed by many life stressors."  Mood dysregulation each month during menses.                 DISCHARGE CRITERIA:  Improved stabilization in mood, thinking, and/or behavior Need for constant or close observation no longer present Reduction of life-threatening or endangering symptoms to within safe limits  PRELIMINARY DISCHARGE PLAN: Return to previous living arrangement  PATIENT/FAMILY INVOLVEMENT: This treatment plan has been presented to and reviewed with the patient, [name].  The patient and family have been given the opportunity to ask questions and make suggestions.  Maudie Flakes, RN 06/10/2022, 1:43 PM

## 2022-06-10 NOTE — ED Notes (Signed)
Nurse called and gave report to Freda Munro at [facility]. IVC paperwork faxed.

## 2022-06-10 NOTE — ED Provider Notes (Addendum)
Emergency Medicine Observation Re-evaluation Note  Anna Nguyen is a 36 y.o. female, seen on rounds today.  Pt initially presented to the ED for complaints of Mental Health Problem Currently, the patient is resting in bed. Pt under IVC after husband concerned she overdosed on her alprazolam.   Physical Exam  BP (!) 141/82 (BP Location: Right Arm)   Pulse 75   Temp 98.4 F (36.9 C) (Oral)   Resp 18   Ht 5\' 8"  (1.727 m)   Wt 103.1 kg   SpO2 100%   BMI 34.56 kg/m  Physical Exam General: NAD Cardiac: well perfused Lungs: even and unlabored Psych: no agitation  ED Course / MDM  EKG:   I have reviewed the labs performed to date as well as medications administered while in observation.  Recent changes in the last 24 hours include evaluated by psychiatry, deemed to meet criteria for inpatient admission. Home medications have been ordered.   Plan  Current plan is for social work coordinating inpatient admission.  1041 Documentation notes patient will be admitted to [facility] today.   Regan Lemming, MD 06/10/22 6237    Regan Lemming, MD 06/10/22 1046
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
## 2022-06-11 ENCOUNTER — Encounter (HOSPITAL_COMMUNITY): Payer: Self-pay

## 2022-06-11 DIAGNOSIS — F333 Major depressive disorder, recurrent, severe with psychotic symptoms: Secondary | ICD-10-CM

## 2022-06-11 LAB — HEMOGLOBIN A1C
Hgb A1c MFr Bld: 6 % — ABNORMAL HIGH (ref 4.8–5.6)
Mean Plasma Glucose: 125.5 mg/dL

## 2022-06-11 LAB — LIPID PANEL
Cholesterol: 154 mg/dL (ref 0–200)
HDL: 63 mg/dL (ref >40)
LDL Cholesterol: 76 mg/dL (ref 0–99)
Total CHOL/HDL Ratio: 2.4 RATIO
Triglycerides: 73 mg/dL (ref <150)
VLDL: 15 mg/dL (ref 0–40)

## 2022-06-11 LAB — VITAMIN B12: Vitamin B-12: 169 pg/mL — ABNORMAL LOW (ref 180–914)

## 2022-06-11 MED ORDER — FLUOXETINE HCL 10 MG PO CAPS
30.0000 mg | ORAL_CAPSULE | Freq: Every day | ORAL | Status: AC
  Administered 2022-06-12: 30 mg via ORAL
  Filled 2022-06-11: qty 3

## 2022-06-11 MED ORDER — FLUOXETINE HCL 20 MG PO CAPS
40.0000 mg | ORAL_CAPSULE | Freq: Every day | ORAL | Status: AC
  Administered 2022-06-13: 40 mg via ORAL
  Filled 2022-06-11: qty 2
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
## 2022-06-11 NOTE — Progress Notes (Signed)
Pt attended group. Pt remains compliant with medication. Pt does not have loestrin pills so dose was not given today. Pt denies current SI/HI/self harm thoughts. Pt reports attending to self care this morning. Pts husband dropped off fmla paperwork for Education officer, museum. Documentation placed on chart and message sent to social work. Pt signed voluntary this morning at 1150am. Q 15 minute checks ongoing for safety.

## 2022-06-11 NOTE — Progress Notes (Signed)
Patient did attend the evening speaker NA meeting.  

## 2022-06-11 NOTE — Group Note (Signed)
Recreation Therapy Group Note   Group Topic:Team Building  Group Date: 06/11/2022 Start Time: 0930 End Time: 0959 Facilitators: Zayda Angell-McCall, LRT,CTRS Location: [address] Day[address]   Goal Area(s) Addresses:  Patient will effectively work with peer towards shared goal.  Patient will identify skill used to make activity successful.  Patient will identify how skills used during activity can be used to reach post d/c goals.    Group Description: Patient(s) were given a set of solo cups, a rubber band, and some tied strings. The objective is to build a pyramid with the cups by only using the rubber band and string to move the cups. After the activity the patient(s) are LRT debriefed and discussed what strategies worked, what didn't, and what lessons they can take from the activity and use in life post discharge.    Affect/Mood: Appropriate   Participation Level: Engaged   Participation Quality: Independent   Behavior: Appropriate   Speech/Thought Process: Focused   Insight: Good   Judgement: Good   Modes of Intervention: Cooperative Play   Patient Response to Interventions:  Engaged   Education Outcome:  Acknowledges education and In group clarification offered    Clinical Observations/Individualized Feedback: Pt engaged and attentive to the suggestions of peers.  Pt made a few suggestions as well.  Pt was bright and into the activity during group session.    Plan: Continue to engage patient in RT group sessions 2-3x/week.   Brittan Mapel-McCall, LRT,CTRS 06/11/2022 12:14 PM

## 2022-06-11 NOTE — BHH Group Notes (Signed)
Adult Goals Group Note  Date:  06/11/2022 Time:  2:05 PM  Group Topic/Focus:  Goals Group:   The focus of this group is to help patients establish daily goals to achieve during treatment and discuss how the patient can incorporate goal setting into their daily lives to aide in recovery.  Participation Level:  Active  Participation Quality:  Appropriate  Affect:  Appropriate  Cognitive:  Appropriate  Insight: Appropriate  Engagement in Group:  Engaged  Modes of Intervention:  Education  Kern Reap 06/11/2022, 2:05 PM

## 2022-06-11 NOTE — Progress Notes (Signed)
   06/11/22 0530  Sleep  Number of Hours 7.75

## 2022-06-11 NOTE — BH IP Treatment Plan (Signed)
Interdisciplinary Treatment and Diagnostic Plan Update  06/11/2022 Time of Session: [place] MRN: [id number]  Principal Diagnosis: MDD (major depressive disorder), recurrent episode, severe ([place])  Secondary Diagnoses: Principal Problem:   MDD (major depressive disorder), recurrent episode, severe ([place]) Active Problems:   Premenstrual dysphoric disorder   Generalized anxiety disorder   Current Medications:  Current Facility-Administered Medications  Medication Dose Route Frequency Provider Last Rate Last Admin   acetaminophen (TYLENOL) tablet 650 mg  650 mg Oral Q6H PRN Onuoha, Josephine C, NP       albuterol (VENTOLIN HFA) 108 (90 Base) MCG/ACT inhaler 2 puff  2 puff Inhalation Q6H PRN Onuoha, Josephine C, NP       alum & mag hydroxide-simeth (MAALOX/MYLANTA) 200-200-20 MG/5ML suspension 30 mL  30 mL Oral Q4H PRN Delfin Gant, NP       [START ON 06/12/2022] FLUoxetine (PROZAC) capsule 30 mg  30 mg Oral Daily Massengill, Nathan, MD       Followed by   Derrill Memo ON 06/13/2022] FLUoxetine (PROZAC) capsule 40 mg  40 mg Oral Daily Massengill, Nathan, MD       folic acid (FOLVITE) tablet 1 mg  1 mg Oral Daily Charmaine Downs C, NP   1 mg at 06/11/22 4098   hydrOXYzine (ATARAX) tablet 25 mg  25 mg Oral TID PRN Delfin Gant, NP   25 mg at 06/10/22 1752   lamoTRIgine (LAMICTAL) tablet 150 mg  150 mg Oral QHS Massengill, Ovid Curd, MD   150 mg at 06/10/22 2113   loperamide (IMODIUM) capsule 2-4 mg  2-4 mg Oral PRN Leevy-Johnson, Brooke A, NP       LORazepam (ATIVAN) tablet 1 mg  1 mg Oral Q6H PRN Leevy-Johnson, Brooke A, NP       magnesium hydroxide (MILK OF MAGNESIA) suspension 30 mL  30 mL Oral Daily PRN Charmaine Downs C, NP       multivitamin with minerals tablet 1 tablet  1 tablet Oral Daily Leevy-Johnson, Brooke A, NP   1 tablet at 06/11/22 0806   Norethindrone Acetate-Ethinyl Estrad-FE (LOESTRIN 24 FE) 1-20 MG-MCG(24) tablet 1 tablet  1 tablet Oral Daily Onuoha,  Josephine C, NP       ondansetron (ZOFRAN-ODT) disintegrating tablet 4 mg  4 mg Oral Q6H PRN Leevy-Johnson, Brooke A, NP       risperiDONE (RISPERDAL) tablet 1 mg  1 mg Oral QHS Massengill, Nathan, MD   1 mg at 06/10/22 2113   traZODone (DESYREL) tablet 50 mg  50 mg Oral QHS PRN Massengill, Ovid Curd, MD   50 mg at 06/10/22 2113   PTA Medications: Medications Prior to Admission  Medication Sig Dispense Refill Last Dose   albuterol (VENTOLIN HFA) 108 (90 Base) MCG/ACT inhaler Inhale 2 puffs into the lungs every 6 (six) hours as needed for wheezing or shortness of breath.      ALPRAZolam (XANAX) 0.5 MG tablet Take 0.5 mg by mouth daily as needed.      amphetamine-dextroamphetamine (ADDERALL) 15 MG tablet Take 1 tablet by mouth 2 (two) times daily as needed (adhd).      Cholecalciferol 1.25 MG (50000 UT) capsule Take 1 capsule (50,000 Units total) by mouth once a week. (Patient not taking: Reported on 06/10/2022) 12 capsule 1    cyanocobalamin (,VITAMIN B-12,) 1000 MCG/ML injection Inject 1000 mcg (or 1 mg) subcutaneously once a week for 4 weeks and then monthly (Patient not taking: Reported on 07/17/1477) 10 mL 3    folic acid (FOLVITE)  1 MG tablet Take 1 tablet (1 mg total) by mouth daily. (Patient not taking: Reported on 06/10/2022) 90 tablet 1    JUNEL FE 24 1-20 MG-MCG(24) tablet Take 1 tablet by mouth daily.      lamoTRIgine (LAMICTAL) 150 MG tablet Take 150 mg by mouth daily.      methylphenidate (CONCERTA) 18 MG CR tablet Take 1 tablet (18 mg total) by mouth daily. (Patient not taking: Reported on 06/10/2022) 30 tablet 0    propranolol (INDERAL) 20 MG tablet Take 20 mg by mouth every 6 (six) hours as needed (anxiety).      risperiDONE (RISPERDAL) 2 MG tablet Take 2 mg by mouth at bedtime.      zinc gluconate 50 MG tablet Take 1 tablet (50 mg total) by mouth daily. (Patient not taking: Reported on 06/10/2022) 90 tablet 1    zolpidem (AMBIEN) 5 MG tablet Take 5 mg by mouth at bedtime as needed for  sleep.       Patient Stressors: Financial difficulties   Occupational concerns   Other: Overwhelm from multiple stressors.    Patient Strengths: Ability for insight  Average or above average intelligence  Communication skills  General fund of knowledge  Motivation for treatment/growth  Supportive family/friends   Treatment Modalities: Medication Management, Group therapy, Case management,  1 to 1 session with clinician, Psychoeducation, Recreational therapy.   Physician Treatment Plan for Primary Diagnosis: MDD (major depressive disorder), recurrent episode, severe (HCC) Long Term Goal(s): Improvement in symptoms so as ready for discharge   Short Term Goals: Ability to identify changes in lifestyle to reduce recurrence of condition will improve Ability to verbalize feelings will improve Ability to disclose and discuss suicidal ideas Ability to demonstrate self-control will improve Ability to identify and develop effective coping behaviors will improve Ability to maintain clinical measurements within normal limits will improve Compliance with prescribed medications will improve Ability to identify triggers associated with substance abuse/mental health issues will improve  Medication Management: Evaluate patient's response, side effects, and tolerance of medication regimen.  Therapeutic Interventions: 1 to 1 sessions, Unit Group sessions and Medication administration.  Evaluation of Outcomes: Progressing  Physician Treatment Plan for Secondary Diagnosis: Principal Problem:   MDD (major depressive disorder), recurrent episode, severe (HCC) Active Problems:   Premenstrual dysphoric disorder   Generalized anxiety disorder  Long Term Goal(s): Improvement in symptoms so as ready for discharge   Short Term Goals: Ability to identify changes in lifestyle to reduce recurrence of condition will improve Ability to verbalize feelings will improve Ability to disclose and discuss  suicidal ideas Ability to demonstrate self-control will improve Ability to identify and develop effective coping behaviors will improve Ability to maintain clinical measurements within normal limits will improve Compliance with prescribed medications will improve Ability to identify triggers associated with substance abuse/mental health issues will improve     Medication Management: Evaluate patient's response, side effects, and tolerance of medication regimen.  Therapeutic Interventions: 1 to 1 sessions, Unit Group sessions and Medication administration.  Evaluation of Outcomes: Progressing   RN Treatment Plan for Primary Diagnosis: MDD (major depressive disorder), recurrent episode, severe (HCC) Long Term Goal(s): Knowledge of disease and therapeutic regimen to maintain health will improve  Short Term Goals: Ability to remain free from injury will improve, Ability to verbalize frustration and anger appropriately will improve, Ability to demonstrate self-control, Ability to participate in decision making will improve, Ability to verbalize feelings will improve, Ability to disclose and discuss suicidal ideas, Ability to  identify and develop effective coping behaviors will improve, and Compliance with prescribed medications will improve  Medication Management: RN will administer medications as ordered by provider, will assess and evaluate patient's response and provide education to patient for prescribed medication. RN will report any adverse and/or side effects to prescribing provider.  Therapeutic Interventions: 1 on 1 counseling sessions, Psychoeducation, Medication administration, Evaluate responses to treatment, Monitor vital signs and CBGs as ordered, Perform/monitor CIWA, COWS, AIMS and Fall Risk screenings as ordered, Perform wound care treatments as ordered.  Evaluation of Outcomes: Adequate for Discharge   LCSW Treatment Plan for Primary Diagnosis: MDD (major depressive disorder),  recurrent episode, severe (HCC) Long Term Goal(s): Safe transition to appropriate next level of care at discharge, Engage patient in therapeutic group addressing interpersonal concerns.  Short Term Goals: Engage patient in aftercare planning with referrals and resources, Increase social support, Increase ability to appropriately verbalize feelings, Increase emotional regulation, Facilitate acceptance of mental health diagnosis and concerns, Facilitate patient progression through stages of change regarding substance use diagnoses and concerns, Identify triggers associated with mental health/substance abuse issues, and Increase skills for wellness and recovery  Therapeutic Interventions: Assess for all discharge needs, 1 to 1 time with Social worker, Explore available resources and support systems, Assess for adequacy in community support network, Educate family and significant other(s) on suicide prevention, Complete Psychosocial Assessment, Interpersonal group therapy.  Evaluation of Outcomes: Progressing   Progress in Treatment: Attending groups: No. Participating in groups: No. Taking medication as prescribed: Yes. Toleration medication: Yes. Family/Significant other contact made: No, will contact:  CSW will obtain consent to reach collateral.  Patient understands diagnosis: Yes. Discussing patient identified problems/goals with staff: Yes. Medical problems stabilized or resolved: Yes. Denies suicidal/homicidal ideation: No. Issues/concerns per patient self-inventory: Yes. Other: none  New problem(s) identified: No, Describe:  none  New Short Term/Long Term Goal(s): Patient to work towards medication management for mood stabilization; elimination of SI thoughts; development of comprehensive mental wellness plan.  Patient Goals: Patient states their goal for treatment is to "need to sleep better."  Discharge Plan or Barriers: No psychosocial barriers identified at this time, patient to  return to place of residence when appropriate for discharge.   Reason for Continuation of Hospitalization: Depression  Estimated Length of Stay: 1-7 days    Scribe for Treatment Team: Almedia Balls 06/11/2022 12:08 PM

## 2022-06-11 NOTE — Progress Notes (Signed)
Pt had panic attack this evening, pt talked to her children and it is one of her kids birthday today and her situation got to her. Writer talked to pt and helped pt through "dark place" . Pt appreciative of writer talking with pt and helping her with her situation. Will continue to monitor.

## 2022-06-11 NOTE — Group Note (Signed)
LCSW Group Therapy Note   Group Date: 06/11/2022 Start Time: 1300 End Time: 1400  Type of Therapy and Topic: Group Therapy: Anger Management   Participation Level:  Active  Description of Group: In this group, patients will learn helpful strategies and techniques to manage anger, express anger in alternative ways, change hostile attitudes, and prevent aggressive acts, such as verbal abuse and violence. This group will be process-oriented and educational, with patients participating in exploration of their own experiences as well as giving and receiving support and challenge from other group members.  Therapeutic Goals: Patient will learn to manage anger. Patient will learn to stop violence or the threat of violence. Patient will learn to develop self control over thoughts and actions. Patient will receive support and feedback from others  Summary of Patient Progress: The Pt came to group and remained there the entire time.  The Pt accepted all worksheets and participated openly in the group discussion.  The Pt was appropriate with peers and discussed various ways they can cope with anger.    Therapeutic Modalities: Cognitive Behavioral Therapy Solution Focused Therapy Motivational [place], [place] 06/11/2022  2:04 PM

## 2022-06-11 NOTE — Progress Notes (Signed)
   06/11/22 2100  Psych Admission Type (Psych Patients Only)  Admission Status Involuntary  Psychosocial Assessment  Patient Complaints None  Eye Contact Fair  Facial Expression Anxious  Affect Anxious  Speech Logical/coherent  Interaction Assertive  Motor Activity Slow  Appearance/Hygiene Unremarkable  Behavior Characteristics Cooperative  Mood Anxious  Thought Process  Coherency Circumstantial  Content Blaming self  Delusions None reported or observed  Perception WDL  Hallucination None reported or observed  Judgment Impaired  Confusion None  Danger to Self  Current suicidal ideation? Denies  Danger to Others  Danger to Others None reported or observed

## 2022-06-12 LAB — VITAMIN D 25 HYDROXY (VIT D DEFICIENCY, FRACTURES): Vit D, 25-Hydroxy: 19.33 ng/mL — ABNORMAL LOW (ref 30–100)

## 2022-06-12 MED ORDER — VITAMIN D3 25 MCG PO TABS
1000.0000 [IU] | ORAL_TABLET | Freq: Every day | ORAL | Status: DC
  Administered 2022-06-12 – 2022-06-13 (×2): 1000 [IU] via ORAL
  Filled 2022-06-12 (×5): qty 1

## 2022-06-12 NOTE — Group Note (Signed)
Date:  06/12/2022 Time:  1:37 PM  Group Topic/Focus:  Self Care:   The focus of this group is to help patients understand the importance of self-care in order to improve or restore emotional, physical, spiritual, interpersonal, and financial health.    Participation Level:  Active  Participation Quality:  Appropriate  Affect:  Appropriate  Cognitive:  Appropriate  Insight: Appropriate  Engagement in Group:  Engaged  Modes of Intervention:  Discussion  Additional Comments:     Jerrye Beavers 06/12/2022, 1:37 PM

## 2022-06-12 NOTE — Progress Notes (Signed)
D:  Patient's self inventory sheet, patient has fair sleep, no sleep medication.  Good appetite, normal energy level, good concentration.  Denied depression and hopeless.  Anxiety #5, I miss my kids!!.  Denied withdrawals.  Denied Si.  Denied physical problems.  Denied physical pain.  Goal is rest.  Plans to rest and sleep.  "I really miss my kids and family.  I would like to work on discharge." No discharge plans. A:  Medications administered per MD orders.  Emotional support and encouragement given patient. R:  Denied SI and HI, contracts for safety.  Denied A/V hallucinations.  Safety maintained with 15 minute checks.

## 2022-06-12 NOTE — Plan of Care (Signed)
Nurse discussed anxiety, depression and coping skills with patient.  
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
## 2022-06-12 NOTE — Progress Notes (Signed)
[facility] MD Progress Note  06/12/2022 10:49 AM Anna Nguyen  MRN:  [id number]  Subjective:  Anna Nguyen is a 36 year old female mother of four, with past psychiatric history of Major depressive disorder recurrent episode severe without psychotic features, ADHD inattentive type, PCOS w/ irregular periods, PMDD who presented to [facility] 06/09/22 with her husband for erratic behavior and making concerning post on social media "done with life". Husband reports erratic behavior. Past history of [facility] admissions 2018 (MDD) pregnant. Family report patient has history of disordered behavior and worsening symptoms around the week before her period related to PMDD diagnosis.  An empty bottle of Alprazolam (filled 06/07/22) and some Benadryl pills found in near bedside. Patient reported bottle fell on the ground and had to throw away the pills. Husband became concerned of possible ingestion and transported pt to hospital.   Working diagnoses this admission: MDD severe recurrent with psychosis (psychosis could have been due to substance use but unclear at this time as patient is minimizing her substance use reporting). PMDD GAD Insomnia DELTA 9 USE   Psychiatry recommendations: stop xanax. start ciwa. stop adderall. stop ambien. decrease risperdal to 1 mg qhs. continue lamotrigine 150 mg qhs. start prozac 10 mg once daily today and increase to 20 mg once daily tomorrow. start trazodone 50 mg qhs prn.   On assessment patient presents alert and oriented x3, calm and cooperative. She verbalized recognizing core of most of her stress and decompensation to her job with plan to take FMLA and look for work. She expressed embarrassment and regret for not recognizing her decline and acting sooner. States she did become emotional last night after phone call with kids due to it being her daughters birthday and kids wondering why she wasn't home; reports staff were supportive and processed with her in support. She reports her  support system is active and has plan to move forward with family vacation to the [place] next week. Expressed appreciation for husband and family who she stated have always been available but felt she could handle things herself. She presents insightful with futuristic thinking.  She denies any symptoms of derealization or depersonalization. She reports sleep quality and quantity as "good". Appetite as "normal"  and concentration is better. She denies having any suicidal or homicidal thoughts, auditory or visual hallucinations. Provider discussed current regimen and target symptoms; she states psychiatric provider had discussed starting Prozac recently and felt comfortable taking it. She denies side effects to medications. She verbalized an understanding of increasing Prozac over days and expressed plan to continue to follow up with outpatient provider for management.  Principal Problem: MDD (major depressive disorder), recurrent episode, severe ([place]) Diagnosis: Principal Problem:   MDD (major depressive disorder), recurrent episode, severe (H[facility]) Active Problems:   Premenstrual dysphoric disorder   Generalized anxiety disorder  Total Time spent with patient: 30 minutes  Past Psychiatric History:  major depression disorder, generalized anxiety disorder, Bipolar disorder (per pt report), PTSD, reported history of ADHD.    This is the patient's second psychiatric hospitalization. First admission in 2018 she reported after having x2 kids within a 2 year period and discovered husband cheating. Past psychiatric medication history: Zoloft, Lamotrigine, Adderall, Ambien, Alprazolam, Propanolol, Risperidone.  Past Medical History:  Past Medical History:  Diagnosis Date   Anemia, iron deficiency    HTN (hypertension)    with last pregnancy   PCOS (polycystic ovarian syndrome)    Type II or unspecified type diabetes mellitus without mention of complication, not stated  as uncontrolled    not on  insulin    Past Surgical History:  Procedure Laterality Date   CESAREAN SECTION N/A 07/08/2017   Procedure: CESAREAN SECTION;  Surgeon: Bobbye Charleston, MD;  Location: [place];  Service: Obstetrics;  Laterality: N/A;   LAPAROSCOPIC GASTRIC BANDING     TONSILLECTOMY     WISDOM TOOTH EXTRACTION     Family History:  Family History  Problem Relation Age of Onset   Arthritis Other    Cancer Maternal Aunt        breast   Diabetes Maternal Uncle    Hyperlipidemia Maternal Uncle    Hypertension Maternal Uncle    Asthma Maternal Grandmother    Diabetes Maternal Grandmother    Hyperlipidemia Maternal Grandmother    Hypertension Maternal Grandmother    Asthma Maternal Grandfather    Diabetes Maternal Grandfather    Hyperlipidemia Maternal Grandfather    Hypertension Maternal Grandfather    Prostate cancer Paternal Grandfather    Family Psychiatric  History:  father diagnosed with schizophrenia. Denies any family history of suicide:      Social History:  Social History   Substance and Sexual Activity  Alcohol Use No     Social History   Substance and Sexual Activity  Drug Use No    Social History   Socioeconomic History   Marital status: Married    Spouse name: Not on file   Number of children: Not on file   Years of education: Not on file   Highest education level: Not on file  Occupational History   Occupation: Scientist, clinical (histocompatibility and immunogenetics): [employer]    Comment: CSR  Tobacco Use   Smoking status: Never   Smokeless tobacco: Never  Vaping Use   Vaping Use: Never used  Substance and Sexual Activity   Alcohol use: No   Drug use: No   Sexual activity: Yes    Partners: Male    Birth control/protection: None, Other-see comments    Comment: husband had vasectomy  Other Topics Concern   Not on file  Social History Narrative   Regular Exercise-yes Caffeine use: yes   Social Determinants of Health   Financial Resource Strain: Not on file  Food  Insecurity: No Food Insecurity (06/10/2022)   Hunger Vital Sign    Worried About Running Out of Food in the Last Year: Never true    Ran Out of Food in the Last Year: Never true  Transportation Needs: No Transportation Needs (06/10/2022)   PRAPARE - Hydrologist (Medical): No    Lack of Transportation (Non-Medical): No  Physical Activity: Not on file  Stress: Not on file  Social Connections: Not on file   Additional Social History:   Sleep: Good  Appetite:  Good  Current Medications: Current Facility-Administered Medications  Medication Dose Route Frequency Provider Last Rate Last Admin   acetaminophen (TYLENOL) tablet 650 mg  650 mg Oral Q6H PRN Charmaine Downs C, NP   650 mg at 06/11/22 2210   albuterol (VENTOLIN HFA) 108 (90 Base) MCG/ACT inhaler 2 puff  2 puff Inhalation Q6H PRN Onuoha, Josephine C, NP       alum & mag hydroxide-simeth (MAALOX/MYLANTA) 200-200-20 MG/5ML suspension 30 mL  30 mL Oral Q4H PRN Charmaine Downs C, NP       [START ON 06/13/2022] FLUoxetine (PROZAC) capsule 40 mg  40 mg Oral Daily Massengill, Nathan, MD       folic acid (  FOLVITE) tablet 1 mg  1 mg Oral Daily Charmaine Downs C, NP   1 mg at 06/12/22 [id number]   hydrOXYzine (ATARAX) tablet 25 mg  25 mg Oral TID PRN Charmaine Downs C, NP   25 mg at 06/11/22 2120   lamoTRIgine (LAMICTAL) tablet 150 mg  150 mg Oral QHS Massengill, Ovid Curd, MD   150 mg at 06/11/22 2117   loperamide (IMODIUM) capsule 2-4 mg  2-4 mg Oral PRN Leevy-Johnson, Yukio Bisping A, NP       LORazepam (ATIVAN) tablet 1 mg  1 mg Oral Q6H PRN Leevy-Johnson, Kalix Meinecke A, NP       magnesium hydroxide (MILK OF MAGNESIA) suspension 30 mL  30 mL Oral Daily PRN Charmaine Downs C, NP       multivitamin with minerals tablet 1 tablet  1 tablet Oral Daily Leevy-Johnson, Blaire Palomino A, NP   1 tablet at 06/12/22 0857   Norethindrone Acetate-Ethinyl Estrad-FE (LOESTRIN 24 FE) 1-20 MG-MCG(24) tablet 1 tablet  1 tablet Oral Daily Onuoha,  Josephine C, NP       ondansetron (ZOFRAN-ODT) disintegrating tablet 4 mg  4 mg Oral Q6H PRN Leevy-Johnson, Bharath Bernstein A, NP       risperiDONE (RISPERDAL) tablet 1 mg  1 mg Oral QHS Massengill, Nathan, MD   1 mg at 06/11/22 2116   traZODone (DESYREL) tablet 50 mg  50 mg Oral QHS PRN Massengill, Ovid Curd, MD   50 mg at 06/11/22 2116    Lab Results:  Results for orders placed or performed during the hospital encounter of 06/10/22 (from the past 48 hour(s))  Vitamin B12     Status: Abnormal   Collection Time: 06/11/22  6:54 PM  Result Value Ref Range   Vitamin B-12 169 (L) 180 - 914 pg/mL    Comment: (NOTE) This assay is not validated for testing neonatal or myeloproliferative syndrome specimens for Vitamin B12 levels. Performed at [facility], [place] [address] [place], [place] [address]   Lipid panel     Status: None   Collection Time: 06/11/22  6:54 PM  Result Value Ref Range   Cholesterol 154 0 - 200 mg/dL   Triglycerides 73 <150 mg/dL   HDL 63 >40 mg/dL   Total CHOL/HDL Ratio 2.4 RATIO   VLDL 15 0 - 40 mg/dL   LDL Cholesterol 76 0 - 99 mg/dL    Comment:        Total Cholesterol/HDL:CHD Risk Coronary Heart Disease Risk Table                     Men   Women  1/2 Average Risk   3.4   3.3  Average Risk       5.0   4.4  2 X Average Risk   9.6   7.1  3 X Average Risk  23.4   11.0        Use the calculated Patient Ratio above and the CHD Risk Table to determine the patient's CHD Risk.        ATP III CLASSIFICATION (LDL):  <100     mg/dL   Optimal  100-129  mg/dL   Near or Above                    Optimal  130-159  mg/dL   Borderline  160-189  mg/dL   High  >190     mg/dL   Very High Performed at [facility]  [address] [place], [place] [address]   Hemoglobin A1c     Status: Abnormal   Collection Time: 06/11/22  6:54 PM  Result Value Ref Range   Hgb A1c MFr Bld 6.0 (H) 4.8 - 5.6 %    Comment: (NOTE) Pre diabetes:           5.7%-6.4%  Diabetes:              >6.4%  Glycemic control for   <7.0% adults with diabetes    Mean Plasma Glucose 125.5 mg/dL    Comment: Performed at [facility] [address], [place],  [address]    Blood Alcohol level:  Lab Results  Component Value Date   [facility] <10 06/09/2022   ETH <5 [id number]    Metabolic Disorder Labs: Lab Results  Component Value Date   HGBA1C 6.0 (H) 06/11/2022   MPG 125.5 06/11/2022   MPG 97 03/25/2016   Lab Results  Component Value Date   PROLACTIN 2.7 11/07/2014   Lab Results  Component Value Date   CHOL 154 06/11/2022   TRIG 73 06/11/2022   HDL 63 06/11/2022   CHOLHDL 2.4 06/11/2022   VLDL 15 06/11/2022   LDLCALC 76 06/11/2022   LDLCALC 82 02/27/2021    Physical Findings: AIMS: Facial and Oral Movements Muscles of Facial Expression: None, normal Lips and Perioral Area: None, normal Jaw: None, normal Tongue: None, normal,Extremity Movements Upper (arms, wrists, hands, fingers): None, normal Lower (legs, knees, ankles, toes): None, normal, Trunk Movements Neck, shoulders, hips: None, normal, Overall Severity Severity of abnormal movements (highest score from questions above): None, normal Incapacitation due to abnormal movements: None, normal Patient's awareness of abnormal movements (rate only patient's report): No Awareness, Dental Status Current problems with teeth and/or dentures?: No Does patient usually wear dentures?: No  CIWA:  CIWA-Ar Total: 1 COWS:     Musculoskeletal: Strength & Muscle Tone: within normal limits Gait & Station: normal Patient leans: N/A  Psychiatric Specialty Exam:  Presentation  General Appearance:  Casual  Eye Contact: Fair  Speech: Normal Rate  Speech Volume: Normal  Handedness: Right   Mood and Affect  Mood: Euthymic  Affect: Congruent   Thought Process  Thought Processes: Coherent; Goal Directed  Descriptions of Associations:Intact  Orientation:Full  (Time, Place and Person)  Thought Content:Logical  History of Schizophrenia/Schizoaffective disorder:No  Duration of Psychotic Symptoms:No data recorded Hallucinations:Hallucinations: None  Ideas of Reference:None  Suicidal Thoughts:Suicidal Thoughts: No  Homicidal Thoughts:Homicidal Thoughts: No   Sensorium  Memory: Immediate Fair; Recent Fair  Judgment: Good  Insight: Good   Executive Functions  Concentration: Good  Attention Span: Good  Recall: Good  Fund of Knowledge: Good  Language: Good   Psychomotor Activity  Psychomotor Activity: Psychomotor Activity: Normal   Assets  Assets: Communication Skills; Desire for Improvement; Financial Resources/Insurance; Housing; Intimacy; Physical Health; Resilience; Social Support; Transportation; Vocational/Educational   Sleep  Sleep: Sleep: Good    Physical Exam: Physical Exam Vitals reviewed.  Pulmonary:     Effort: Pulmonary effort is normal.  Neurological:     Mental Status: She is alert.     Motor: No weakness.     Gait: Gait normal.  Psychiatric:        Thought Content: Thought content normal.    Review of Systems  Neurological:  Negative for dizziness, tingling, tremors and headaches.  Psychiatric/Behavioral:  Positive for depression. The patient is nervous/anxious.    Blood pressure (!) 119/102, pulse (!) 118, temperature 98.3 F (36.8  C), temperature source Oral, resp. rate 16, height 5\' 9"  (1.753 m), weight 100.2 kg, last menstrual period 05/12/2022, SpO2 98 %. Body mass index is 32.64 kg/m.   Treatment Plan Summary: Daily contact with patient to assess and evaluate symptoms and progress in treatment  Assessment: MDD severe recurrent with psychosis (psychosis could have been due to substance use but unclear at this time as patient is minimizing her substance use reporting). PMDD GAD Insomnia DELTA 9 USE  PLAN: Safety and Monitoring:             --  Voluntary admission to  inpatient psychiatric unit for  safety, stabilization and treatment             -- Daily contact with patient to assess and evaluate  symptoms and progress in treatment             -- Patient's case to be discussed in multi-disciplinary team  meeting             -- Observation Level : q15 minute checks             -- Vital signs:  q12 hours             -- Precautions: suicide, elopement, and assault   2. Psychiatric Diagnoses and Treatment:               -Increase Prozac from 30 mg to 40 mg once daily for MDD, PTSD and GAD -Continue Trazodone 50 mg nightly as needed -Continue Risperdal 1 mg nightly (recently reduced from 2 mg nightly) -Continue Lamotrigine 150 mg nightly -Previously discontinued: Xanax, Adderall, Ambien  -The CIWA for any benzodiazepine withdrawal -Counseled on cessation of any marijuana or delta 8 containing products   --  The risks/benefits/side-effects/alternatives to this medication were discussed in detail with the patient and time was given for questions. The patient consents to medication trial.              -- Metabolic profile and EKG monitoring obtained while on  an atypical antipsychotic (BMI: Lipid Panel: HbgA1c: QTc:)              -- Encouraged patient to participate in unit milieu and in  scheduled group therapies                           3. Medical Issues Being Addressed:  -B12 level 169:  -patient has history of gastric bypass. States active Rx  from PCP for injections. Declines any replacement at this time.   -Vitamin D, 25 Hydroxy 19.33:   -Start Vitamin D 1000 mg daily    4. Discharge Planning:              -- Social work and case management to assist with discharge planning and identification of hospital follow-up needs prior to discharge             -- Estimated LOS: 5-7 days             -- Discharge Concerns: Need to establish a safety plan; Medication compliance and effectiveness             -- Discharge Goals: Return home with  outpatient referrals for mental health follow-up including medication management/psychotherapy  Inda Merlin, NP 06/12/2022, 10:49 AM   Patient ID: Turner Daniels, female   DOB: Dec 09, 1985, 36 y.o.   MRN: [id number]

## 2022-06-12 NOTE — Group Note (Signed)
Date:  06/12/2022 Time:  11:52 AM  Group Topic/Focus:  Orientation:   The focus of this group is to educate the patient on the purpose and policies of crisis stabilization and provide a format to answer questions about their admission.  The group details unit policies and expectations of patients while admitted.    Participation Level:  Active  Participation Quality:  Appropriate  Affect:  Appropriate  Cognitive:  Appropriate  Insight: Appropriate  Engagement in Group:  Engaged  Modes of Intervention:  Discussion   Additional Comments:     Jerrye Beavers 06/12/2022, 11:52 AM

## 2022-06-12 NOTE — [facility] Suicide Risk Assessment (Signed)
[place] INPATIENT:  Family/Significant Other Suicide Prevention Education  Suicide Prevention Education:  Education Completed; Barista [phone] (Husband) has been identified by the patient as the family member/significant other with whom the patient will be residing, and identified as the person(s) who will aid the patient in the event of a mental health crisis (suicidal ideations/suicide attempt).  With written consent from the patient, the family member/significant other has been provided the following suicide prevention education, prior to the and/or following the discharge of the patient.  The suicide prevention education provided includes the following: Suicide risk factors Suicide prevention and interventions National Suicide Hotline telephone number [facility] assessment telephone number [facility] Emergency Assistance [place] and/or Residential Mobile Crisis Unit telephone number  Request made of family/significant other to: Remove weapons (e.g., guns, rifles, knives), all items previously/currently identified as safety concern.   Remove drugs/medications (over-the-counter, prescriptions, illicit drugs), all items previously/currently identified as a safety concern.  The family member/significant other verbalizes understanding of the suicide prevention education information provided.  The family member/significant other agrees to remove the items of safety concern listed above.  CSW spoke with Mr. Pluta who states that his wife has been stressed with work.  He states that she has also been diagnosed with PMDD and gets tired easily, zones out, and forgets things.  He statse that prior to admission his wife posted a statement on social media and her brother called concerned about it.  He states that his wife told him that she only took one Xanax pill but he states that the bottle was empty and he was concerned about her safety and her behaviors.  He  states that they came to the hospital out of precaution.  Mr. Preiss states that since admission his wife is doing better and he does not believe that she took an overdose of medications.  He confirms that she can return to the home after discharge and that all firearms in the home have been placed in a locked box that she does not have access too.  Mr. Shellhammer states that he will be making sure that his wife attends all of her scheduled appointments and is making arrangements so his wife can have time off from her job to recover.  CSW completed SPE with Mr. Bushong.   Darleen Crocker 06/12/2022, 3:48 PM

## 2022-06-12 NOTE — Progress Notes (Signed)
   06/12/22 0515  Sleep  Number of Hours 6.75

## 2022-06-12 NOTE — Progress Notes (Signed)
Adult Psychoeducational Group Note  Date:  06/12/2022 Time:  8:58 PM  Group Topic/Focus:  Wrap-Up Group:   The focus of this group is to help patients review their daily goal of treatment and discuss progress on daily workbooks.  Participation Level:  Active  Participation Quality:  Appropriate  Affect:  Appropriate  Cognitive:  Appropriate  Insight: Appropriate  Engagement in Group:  Engaged  Modes of Intervention:  Education  Additional Comments:  Patient attended and participated in group tonight.  Patient reports that while she has been here she learnt that its was OK not to be OK.  Salley Scarlet [facility] 06/12/2022, 8:58 PM

## 2022-06-13 MED ORDER — LAMOTRIGINE 150 MG PO TABS: 150.0000 mg | ORAL_TABLET | Freq: Every day | ORAL | 0 refills | Status: AC

## 2022-06-13 MED ORDER — FLUOXETINE HCL 40 MG PO CAPS: 40.0000 mg | ORAL_CAPSULE | Freq: Every day | ORAL | 0 refills | Status: AC

## 2022-06-13 MED ORDER — RISPERIDONE 1 MG PO TABS: 1.0000 mg | ORAL_TABLET | Freq: Every day | ORAL | 0 refills | Status: AC

## 2022-06-13 MED ORDER — VITAMIN D3 25 MCG PO TABS: 1000.0000 [IU] | ORAL_TABLET | Freq: Every day | ORAL | 0 refills | Status: AC

## 2022-06-13 MED ORDER — HYDROXYZINE HCL 25 MG PO TABS: 25.0000 mg | ORAL_TABLET | Freq: Three times a day (TID) | ORAL | 0 refills | Status: AC | PRN

## 2022-06-13 MED ORDER — ADULT MULTIVITAMIN W/MINERALS CH: 1.0000 | ORAL_TABLET | Freq: Every day | ORAL | 0 refills | Status: AC

## 2022-06-13 MED ORDER — TRAZODONE HCL 50 MG PO TABS: 50.0000 mg | ORAL_TABLET | Freq: Every evening | ORAL | 0 refills | Status: AC | PRN

## 2022-06-13 NOTE — Progress Notes (Signed)
D: Pt A & O X 4. Denies SI, HI, AVH and pain at this time. D/C home as ordered. Picked up in lobby by "my husband". A: D/C instructions reviewed with pt including prescriptions and follow up appointment;  compliance encouraged. All belongings from assigned locker given to pt at time of departure. Scheduled  medications given with verbal education and effects monitored. Safety checks maintained without incident till time of d/c.  R: Pt receptive to care. Compliant with medications when offered. Denies adverse drug reactions when assessed. Verbalized understanding related to d/c instructions. Signed belonging sheet in agreement with items received from locker. Ambulatory with a steady gait. Appears to be in no physical distress at time of departure.

## 2022-06-13 NOTE — B[facility] Group Notes (Signed)
Adult Goals Group Note  Date:  06/13/2022 Time:  9:54 AM  Group Topic/Focus:  Goals Group:   The focus of this group is to help patients establish daily goals to achieve during treatment and discuss how the patient can incorporate goal setting into their daily lives to aide in recovery.  Participation Level:  Active  Participation Quality:  Appropriate  Affect:  Appropriate   Cognitive:  Appropriate  Insight: Appropriate  Engagement in Group:  Engaged  Modes of Intervention:  Education  Donzell Coller N Indie Boehne 06/13/2022, 9:54 AM 

## 2022-06-13 NOTE — Progress Notes (Signed)

## 2022-06-13 NOTE — Discharge Summary (Signed)
Physician Discharge Summary Note  Patient:  Anna Nguyen is an 36 y.o., female MRN:  [id number] DOB:  10-May-1986 Patient phone:  [phone] (home)  Patient address:   [address] [place] [address],  Total Time spent with patient: 30 minutes  Date of Admission:  06/10/2022 Date of Discharge: 06/13/2022  Reason for Admission:  Karalee Markes is a 35 year old female mother of four, with past psychiatric history of Major depressive disorder recurrent episode severe without psychotic features, ADHD inattentive type, PCOS w/ irregular periods, PMDD who presented to [facility] 06/09/22 with her husband for erratic behavior and making concerning post on social media "done with life". Husband reports erratic behavior. Past history of [facility] admissions 2018 (MDD) pregnant. Family report patient has history of disordered behavior and worsening symptoms around the week before her period related to PMDD diagnosis.  An empty bottle of Alprazolam (filled 06/07/22) and some Benadryl pills found in near bedside. Patient reported bottle fell on the ground and had to throw away the pills. Husband became concerned of possible ingestion and transported pt to hospital. Pt was transferred to this [facility] for treatment and stabilization of her mood.  Principal Problem: MDD (major depressive disorder), recurrent episode, severe (HCC) Discharge Diagnoses: Principal Problem:   MDD (major depressive disorder), recurrent episode, severe (HCC) Active Problems:   Premenstrual dysphoric disorder   Generalized anxiety disorder  Past Psychiatric History: GAD, MDD, PTSD, insomnia  Past Medical History:  Past Medical History:  Diagnosis Date   Anemia, iron deficiency    HTN (hypertension)    with last pregnancy   PCOS (polycystic ovarian syndrome)    Type II or unspecified type diabetes mellitus without mention of complication, not stated as uncontrolled    not on insulin    Past Surgical History:  Procedure  Laterality Date   CESAREAN SECTION N/A 07/08/2017   Procedure: CESAREAN SECTION;  Surgeon: Carrington Clamp, MD;  Location: [facility] SUIT[facility];  Service: Obstetrics;  Laterality: N/A;   LAPAROSCOPIC GASTRIC BANDING     TONSILLECTOMY     WISDOM TOOTH EXTRACTION     Family History:  Family History  Problem Relation Age of Onset   Arthritis Other    Cancer Maternal Aunt        breast   Diabetes Maternal Uncle    Hyperlipidemia Maternal Uncle    Hypertension Maternal Uncle    Asthma Maternal Grandmother    Diabetes Maternal Grandmother    Hyperlipidemia Maternal Grandmother    Hypertension Maternal Grandmother    Asthma Maternal Grandfather    Diabetes Maternal Grandfather    Hyperlipidemia Maternal Grandfather    Hypertension Maternal Grandfather    Prostate cancer Paternal Grandfather    Family Psychiatric  History: none reported Social History:  Social History   Substance and Sexual Activity  Alcohol Use No     Social History   Substance and Sexual Activity  Drug Use No    Social History   Socioeconomic History   Marital status: Married    Spouse name: Not on file   Number of children: Not on file   Years of education: Not on file   Highest education level: Not on file  Occupational History   Occupation: Investment banker, corporate: [employer]    Comment: CSR  Tobacco Use   Smoking status: Never   Smokeless tobacco: Never  Vaping Use   Vaping Use: Never used  Substance and Sexual Activity   Alcohol use: No  Drug use: No   Sexual activity: Yes    Partners: Male    Birth control/protection: None, Other-see comments    Comment: husband had vasectomy  Other Topics Concern   Not on file  Social History Narrative   Regular Exercise-yes Caffeine use: yes   Social Determinants of Health   Financial Resource Strain: Not on file  Food Insecurity: No Food Insecurity (06/10/2022)   Hunger Vital Sign    Worried About Running Out of Food in the Last Year: Never  true    Ran Out of Food in the Last Year: Never true  Transportation Needs: No Transportation Needs (06/10/2022)   PRAPARE - Administrator, Civil ServiceTransportation    Lack of Transportation (Medical): No    Lack of Transportation (Non-Medical): No  Physical Activity: Not on file  Stress: Not on file  Social Connections: Not on file                                                    HOSPITAL COURSE  During the patient's hospitalization, patient had extensive initial psychiatric evaluation, and follow-up psychiatric evaluations every day. Psychiatric diagnoses provided upon initial assessment were MDD & GAD.   Patient's psychiatric medications were adjusted on admission as follows:               Major Depressive Disorder severe recurrent episode -Restarted Lamotrigine 150 daily, HS  for depressive mood symptoms -Decreased Risperidal from 2 mg daily to 1 mg daily for mood, psychotic symptoms              Generalized Anxiety Disorder:  -Discontinued Alprazolam 0.5 mg (home med -Started CIWA protocol to prevent/manage withdrawal sympto -Started Hydroxyzine 25 mg TID PRN anxiety                   Premenstrual Dysphoris Disorder:  -Started Fluoxetine (Prozac) 10 mg 06/10/22; Increased to  20 mg 06/11/22 for depressive mood symptoms                                                                           Insomnia      -Discontinued Zolpidem (Ambien) 5 mg PRN HS insomnia (home med) -Started Trazodone 50 mg PRN HS insomnia   During the hospitalization, other adjustments were made to the patient's psychiatric medication regimen with medications at discharge being as follows: -Continue Prozac 40 mg daily ( for MDD, PTSD and GAD) -Continue Trazodone 50 mg nightly as needed -Continue Risperdal 1 mg nightly (recently reduced from 2 mg nightly) -Continue Lamotrigine 150 mg nightly -Previously discontinued: Xanax, Adderall, Ambien -Counseled on cessation of any marijuana or delta 8 containing products   Patient's care  was discussed during the interdisciplinary team meeting every day during the hospitalization. The patient denies having side effects to prescribed psychiatric medication.   The patient was evaluated each day by a clinical provider to ascertain response to treatment. Improvement was noted by the patient's report of decreasing symptoms, improved sleep and appetite, affect, medication tolerance, behavior, and participation in unit programming.  Patient was asked each day  to complete a self inventory noting mood, mental status, pain, new symptoms, anxiety and concerns.     Symptoms were reported as significantly decreased or resolved completely by discharge.  On day of discharge, the patient reports that their mood is stable. The patient denied having suicidal thoughts for more than 48 hours prior to discharge.  Patient denies having homicidal thoughts.  Patient denies having auditory hallucinations.  Patient denies any visual hallucinations or other symptoms of psychosis. The patient was motivated to continue taking medication with a goal of continued improvement in mental health.    The patient reports their target psychiatric symptoms of depression, anxiety and insomnia responded well to the psychiatric medications, and the patient reports overall benefit other psychiatric hospitalization. Supportive psychotherapy was provided to the patient. The patient also participated in regular group therapy while hospitalized. Coping skills, problem solving as well as relaxation therapies were also part of the unit programming.   Labs were reviewed with the patient, and abnormal results were discussed with the patient. Pt has been educated to f/u with her PCP regarding other medical conditions, and has verbalized understanding.  Physical Findings: AIMS: Facial and Oral Movements Muscles of Facial Expression: None, normal Lips and Perioral Area: None, normal Jaw: None, normal Tongue: None, normal,Extremity  Movements Upper (arms, wrists, hands, fingers): None, normal Lower (legs, knees, ankles, toes): None, normal, Trunk Movements Neck, shoulders, hips: None, normal, Overall Severity Severity of abnormal movements (highest score from questions above): None, normal Incapacitation due to abnormal movements: None, normal Patient's awareness of abnormal movements (rate only patient's report): No Awareness, Dental Status Current problems with teeth and/or dentures?: No Does patient usually wear dentures?: No  CIWA:  CIWA-Ar Total: 0 COWS:  n/a AIMS:0   Musculoskeletal: Strength & Muscle Tone: within normal limits Gait & Station: normal Patient leans: N/A  Psychiatric Specialty Exam:  Presentation  General Appearance:  Appropriate for Environment  Eye Contact: Good  Speech: Clear and Coherent  Speech Volume: Normal  Handedness: Right  Mood and Affect  Mood: Euthymic  Affect: Appropriate; Congruent  Thought Process  Thought Processes: Coherent; Goal Directed  Descriptions of Associations:Intact  Orientation:Full (Time, Place and Person)  Thought Content:Logical  History of Schizophrenia/Schizoaffective disorder:No  Duration of Psychotic Symptoms:No data recorded Hallucinations:Hallucinations: None  Ideas of Reference:None  Suicidal Thoughts:Suicidal Thoughts: No  Homicidal Thoughts:Homicidal Thoughts: No   Sensorium  Memory: Immediate Fair; Recent Fair  Judgment: Good  Insight: Good  Executive Functions  Concentration: Good  Attention Span: Good  Recall: Good  Fund of Knowledge: Good  Language: Good  Psychomotor Activity  Psychomotor Activity: Psychomotor Activity: Normal  Assets  Assets: Communication Skills  Sleep  Sleep: Sleep: Good  Physical Exam: Physical Exam Constitutional:      Appearance: Normal appearance.  HENT:     Head: Normocephalic.     Nose: Nose normal.  Eyes:     Pupils: Pupils are equal, round, and  reactive to light.  Musculoskeletal:        General: Normal range of motion.     Cervical back: Normal range of motion.  Neurological:     Mental Status: She is alert and oriented to person, place, and time.     Coordination: Coordination normal.  Psychiatric:        Behavior: Behavior normal.        Thought Content: Thought content normal.    Review of Systems  Constitutional: Negative.   HENT: Negative.    Eyes: Negative.  Respiratory: Negative.    Cardiovascular: Negative.   Gastrointestinal: Negative.   Genitourinary: Negative.   Musculoskeletal: Negative.   Skin: Negative.   Psychiatric/Behavioral:  Positive for depression and substance abuse (Educated on the negative impact of delata 8 use on mental health). Negative for hallucinations, memory loss and suicidal ideas. The patient is nervous/anxious (improving on current meds) and has insomnia (improving on current meds).    Blood pressure (!) 131/92, pulse 87, temperature 98 F (36.7 C), temperature source Oral, resp. rate 17, height 5\' 9"  (1.753 m), weight 100.2 kg, last menstrual period 05/12/2022, SpO2 100 %. Body mass index is 32.64 kg/m.   Social History   Tobacco Use  Smoking Status Never  Smokeless Tobacco Never   Tobacco Cessation:  N/A, patient does not currently use tobacco products  Blood Alcohol level:  Lab Results  Component Value Date   ETH <10 06/09/2022   ETH <5 03/12/2017   Metabolic Disorder Labs:  Lab Results  Component Value Date   HGBA1C 6.0 (H) 06/11/2022   MPG 125.5 06/11/2022   MPG 97 03/25/2016   Lab Results  Component Value Date   PROLACTIN 2.7 11/07/2014   Lab Results  Component Value Date   CHOL 154 06/11/2022   TRIG 73 06/11/2022   HDL 63 06/11/2022   CHOLHDL 2.4 06/11/2022   VLDL 15 06/11/2022   LDLCALC 76 06/11/2022   LDLCALC 82 02/27/2021   See Psychiatric Specialty Exam and Suicide Risk Assessment completed by Attending Physician prior to discharge.  Discharge  destination:  Home  Is patient on multiple antipsychotic therapies at discharge:  No   Has Patient had three or more failed trials of antipsychotic monotherapy by history:  No  Recommended Plan for Multiple Antipsychotic Therapies: NA  Allergies as of 06/13/2022       Reactions   Aspirin Other (See Comments)   Pt cannot recall reaction   Nsaids Other (See Comments)   Pt cannot recall reaction   Latex Rash        Medication List     STOP taking these medications    ALPRAZolam 0.5 MG tablet Commonly known as: XANAX   amphetamine-dextroamphetamine 15 MG tablet Commonly known as: ADDERALL   Cholecalciferol 1.25 MG (50000 UT) capsule Replaced by: vitamin D3 25 MCG tablet   cyanocobalamin 1000 MCG/ML injection Commonly known as: VITAMIN B12   methylphenidate 18 MG CR tablet Commonly known as: CONCERTA   propranolol 20 MG tablet Commonly known as: INDERAL   zinc gluconate 50 MG tablet   zolpidem 5 MG tablet Commonly known as: AMBIEN       TAKE these medications      Indication  albuterol 108 (90 Base) MCG/ACT inhaler Commonly known as: VENTOLIN HFA Inhale 2 puffs into the lungs every 6 (six) hours as needed for wheezing or shortness of breath.  Indication: Spasm of Lung Air Passages   FLUoxetine 40 MG capsule Commonly known as: PROzac Take 1 capsule (40 mg total) by mouth daily.  Indication: Generalized Anxiety Disorder, Major Depressive Disorder, Panic Disorder, Posttraumatic Stress Disorder   folic acid 1 MG tablet Commonly known as: FOLVITE Take 1 tablet (1 mg total) by mouth daily.  Indication: nutritional support   hydrOXYzine 25 MG tablet Commonly known as: ATARAX Take 1 tablet (25 mg total) by mouth 3 (three) times daily as needed for anxiety.  Indication: Feeling Anxious   Junel Fe 24 1-20 MG-MCG(24) tablet Generic drug: Norethindrone Acetate-Ethinyl Estrad-FE Take 1 tablet by mouth  daily.  Indication: Polycystic Ovary Syndrome    lamoTRIgine 150 MG tablet Commonly known as: LAMICTAL Take 1 tablet (150 mg total) by mouth at bedtime. What changed: when to take this  Indication: MDD   multivitamin with minerals Tabs tablet Take 1 tablet by mouth daily. Start taking on: June 14, 2022  Indication: Nutritional Support   risperiDONE 1 MG tablet Commonly known as: RISPERDAL Take 1 tablet (1 mg total) by mouth at bedtime. What changed:  medication strength how much to take  Indication: Major Depressive Disorder   traZODone 50 MG tablet Commonly known as: DESYREL Take 1 tablet (50 mg total) by mouth at bedtime as needed for sleep.  Indication: Trouble Sleeping   vitamin D3 25 MCG tablet Commonly known as: CHOLECALCIFEROL Take 1 tablet (1,000 Units total) by mouth daily. Start taking on: June 14, 2022 Replaces: Cholecalciferol 1.25 MG (50000 UT) capsule  Indication: Vitamin D Deficiency        Follow-up Information     [facility] Call on 06/17/2022.   Why: You have an appointment for medication management services on 06/17/22 at 1:00 pm.  *Please discuss your balance, and the scheduling of a therapy appointment at this time. Contact information: [address] [place] [address] [phone]                Follow-up recommendations:   The patient is able to verbalize their individual safety plan to this provider.   # It is recommended to the patient to continue psychiatric medications as prescribed, after discharge from the hospital.     # It is recommended to the patient to follow up with your outpatient psychiatric provider and PCP.   # It was discussed with the patient, the impact of alcohol, drugs, tobacco have been there overall psychiatric and medical wellbeing, and total abstinence from substance use was recommended the patient.ed.   # Prescriptions provided or sent directly to preferred pharmacy at discharge. Patient agreeable to plan. Given opportunity to  ask questions. Appears to feel comfortable with discharge.    # In the event of worsening symptoms, the patient is instructed to call the crisis hotline (988), 911 and or go to the nearest ED for appropriate evaluation and treatment of symptoms. To follow-up with primary care provider for other medical issues, concerns and or health care needs   # Patient was discharged home with a plan to follow up as noted above.    Signed: Starleen Blue, NP 06/13/2022, 1:43 PM

## 2022-06-13 NOTE — [facility] Suicide Risk Assessment (Signed)
Suicide Risk Assessment  Discharge Assessment    [facility] Discharge Suicide Risk Assessment   Principal Problem: MDD (major depressive disorder), recurrent episode, severe (HCC) Discharge Diagnoses: Principal Problem:   MDD (major depressive disorder), recurrent episode, severe (HCC) Active Problems:   Premenstrual dysphoric disorder   Generalized anxiety disorder  Reason For Admission: Anna Nguyen is a 36 year old female mother of four, with past psychiatric history of Major depressive disorder recurrent episode severe without psychotic features, ADHD inattentive type, PCOS w/ irregular periods, PMDD who presented to [facility] 06/09/22 with her husband for erratic behavior and making concerning post on social media "done with life". Husband reports erratic behavior. Past history of [facility] admissions 2018 (MDD) pregnant. Family report patient has history of disordered behavior and worsening symptoms around the week before her period related to PMDD diagnosis.  An empty bottle of Alprazolam (filled 06/07/22) and some Benadryl pills found in near bedside. Patient reported bottle fell on the ground and had to throw away the pills. Husband became concerned of possible ingestion and transported pt to hospital. Pt was transferred to this [facility] for treatment and stabilization of her mood.                                                  HOSPITAL COURSE  During the patient's hospitalization, patient had extensive initial psychiatric evaluation, and follow-up psychiatric evaluations every day. Psychiatric diagnoses provided upon initial assessment were MDD & GAD.  Patient's psychiatric medications were adjusted on admission as follows:               Major Depressive Disorder severe recurrent episode -Restarted Lamotrigine 150 daily, HS  for depressive mood symptoms -Decreased Risperidal from 2 mg daily to 1 mg daily for mood, psychotic symptoms              Generalized Anxiety Disorder:  -Discontinued  Alprazolam 0.5 mg (home med -Started CIWA protocol to prevent/manage withdrawal sympto -Started Hydroxyzine 25 mg TID PRN anxiety                   Premenstrual Dysphoris Disorder:  -Started Fluoxetine (Prozac) 10 mg 06/10/22; Increased to  20 mg 06/11/22 for depressive mood symptoms                                                                           Insomnia      -Discontinued Zolpidem (Ambien) 5 mg PRN HS insomnia (home med) -Started Trazodone 50 mg PRN HS insomnia  During the hospitalization, other adjustments were made to the patient's psychiatric medication regimen with medications at discharge being as follows: -Continue Prozac 40 mg daily ( for MDD, PTSD and GAD) -Continue Trazodone 50 mg nightly as needed -Continue Risperdal 1 mg nightly (recently reduced from 2 mg nightly) -Continue Lamotrigine 150 mg nightly -Previously discontinued: Xanax, Adderall, Ambien -Counseled on cessation of any marijuana or delta 8 containing products  Patient's care was discussed during the interdisciplinary team meeting every day during the hospitalization. The patient denies having side effects to prescribed psychiatric medication.  The patient was evaluated each day by a clinical provider to ascertain response to treatment. Improvement was noted by the patient's report of decreasing symptoms, improved sleep and appetite, affect, medication tolerance, behavior, and participation in unit programming.  Patient was asked each day to complete a self inventory noting mood, mental status, pain, new symptoms, anxiety and concerns.    Symptoms were reported as significantly decreased or resolved completely by discharge.  On day of discharge, the patient reports that their mood is stable. The patient denied having suicidal thoughts for more than 48 hours prior to discharge.  Patient denies having homicidal thoughts.  Patient denies having auditory hallucinations.  Patient denies any visual hallucinations  or other symptoms of psychosis. The patient was motivated to continue taking medication with a goal of continued improvement in mental health.   The patient reports their target psychiatric symptoms of depression, anxiety and insomnia responded well to the psychiatric medications, and the patient reports overall benefit other psychiatric hospitalization. Supportive psychotherapy was provided to the patient. The patient also participated in regular group therapy while hospitalized. Coping skills, problem solving as well as relaxation therapies were also part of the unit programming.  Labs were reviewed with the patient, and abnormal results were discussed with the patient. Pt has been educated to f/u with her PCP regarding other medical conditions, and has verbalized understanding.  Total Time spent with patient: 30 minutes  Musculoskeletal: Strength & Muscle Tone: within normal limits Gait & Station: normal Patient leans: N/A  Psychiatric Specialty Exam  Presentation  General Appearance:  Appropriate for Environment  Eye Contact: Good  Speech: Clear and Coherent  Speech Volume: Normal  Handedness: Right   Mood and Affect  Mood: Euthymic  Duration of Depression Symptoms: Greater than two weeks  Affect: Appropriate; Congruent  Thought Process  Thought Processes: Coherent; Goal Directed  Descriptions of Associations:Intact  Orientation:Full (Time, Place and Person)  Thought Content:Logical  History of Schizophrenia/Schizoaffective disorder:No  Duration of Psychotic Symptoms:No data recorded Hallucinations:Hallucinations: None  Ideas of Reference:None  Suicidal Thoughts:Suicidal Thoughts: No  Homicidal Thoughts:Homicidal Thoughts: No  Sensorium  Memory: Immediate Fair; Recent Fair  Judgment: Good  Insight: Good  Executive Functions  Concentration: Good  Attention Span: Good  Recall: Good  Fund of  Knowledge: Good  Language: Good  Psychomotor Activity  Psychomotor Activity: Psychomotor Activity: Normal  Assets  Assets: Communication Skills  Sleep  Sleep: Sleep: Good  Physical Exam: Physical Exam Constitutional:      Appearance: Normal appearance.  HENT:     Head: Normocephalic.     Nose: Nose normal. No congestion or rhinorrhea.  Eyes:     Pupils: Pupils are equal, round, and reactive to light.  Pulmonary:     Effort: Pulmonary effort is normal.  Musculoskeletal:        General: Normal range of motion.     Cervical back: Normal range of motion.  Neurological:     Mental Status: She is alert and oriented to person, place, and time.  Psychiatric:        Behavior: Behavior normal.        Thought Content: Thought content normal.    Review of Systems  Constitutional: Negative.   HENT: Negative.    Eyes: Negative.   Respiratory: Negative.    Cardiovascular: Negative.   Gastrointestinal: Negative.   Genitourinary: Negative.   Musculoskeletal: Negative.   Skin: Negative.   Neurological: Negative.   Psychiatric/Behavioral:  Positive for depression and substance abuse (educated on  the need to abstain from Delta products). Negative for hallucinations, memory loss and suicidal ideas. The patient is nervous/anxious (resolving on current medications) and has insomnia (resolved on Trazodone).    Blood pressure (!) 131/92, pulse 87, temperature 98 F (36.7 C), temperature source Oral, resp. rate 17, height 5\' 9"  (1.753 m), weight 100.2 kg, last menstrual period 05/12/2022, SpO2 100 %. Body mass index is 32.64 kg/m.  Mental Status Per Nursing Assessment::   On Admission:  NA  Demographic Factors:  NA  Loss Factors: NA  Historical Factors: Impulsivity  Risk Reduction Factors:   Living with another person, especially a relative, Positive social support, and Positive therapeutic relationship  Continued Clinical Symptoms:  Patient reports that her depressive  symptoms have reduced significantly since admission. She reports that her mental health is stable and denies SI/HI/AVH. She is verbally contracting for safety and verbalizes readiness for discharge.  Cognitive Features That Contribute To Risk:  None    Suicide Risk:  Minimal: No identifiable suicidal ideation.  Patients presenting with no risk factors but with morbid ruminations; may be classified as minimal risk based on the severity of the depressive symptoms   [place] Call on 06/17/2022.   Why: You have an appointment for medication management services on 06/17/22 at 1:00 pm.  *Please discuss your balance, and the scheduling of a therapy appointment at this time. Contact information: [address] [place] [address] [phone]                Plan Of Care/Follow-up recommendations:  The patient is able to verbalize their individual safety plan to this provider.  # It is recommended to the patient to continue psychiatric medications as prescribed, after discharge from the hospital.    # It is recommended to the patient to follow up with your outpatient psychiatric provider and PCP.  # It was discussed with the patient, the impact of alcohol, drugs, tobacco have been there overall psychiatric and medical wellbeing, and total abstinence from substance use was recommended the patient.ed.  # Prescriptions provided or sent directly to preferred pharmacy at discharge. Patient agreeable to plan. Given opportunity to ask questions. Appears to feel comfortable with discharge.    # In the event of worsening symptoms, the patient is instructed to call the crisis hotline (988), 911 and or go to the nearest ED for appropriate evaluation and treatment of symptoms. To follow-up with primary care provider for other medical issues, concerns and or health care needs  # Patient was discharged home with a plan to follow up as noted above.    Nicholes Rough, NP 06/13/2022, 12:58 PM

## 2022-06-13 NOTE — Progress Notes (Signed)
  [facility] Adult Case Management Discharge Plan :  Will you be returning to the same living situation after discharge:  Yes,  Home with Husband  At discharge, do you have transportation home?: Yes,  Husband  Do you have the ability to pay for your medications: Yes,  [employer] of information consent forms completed and in the chart;  Patient's signature needed at discharge.  Patient to Follow up at:  Follow-up [place] Call on 06/17/2022.   Why: You have an appointment for medication management services on 06/17/22 at 1:00 pm.  *Please discuss your balance, and the scheduling of a therapy appointment at this time. Contact information: [place] [address] [phone]                 Next level of care provider has access to [place] and Suicide Prevention discussed: Yes,  with patient and husband      Has patient been referred to the Quitline?: N/A patient is not a smoker  Patient has been referred for addiction treatment: N/A  Darleen Crocker, [place] 06/13/2022, 9:34 AM

## 2022-06-13 NOTE — Group Note (Signed)
Recreation Therapy Group Note   Group Topic:Team Building  Group Date: 06/13/2022 Start Time: 0930 End Time: 1000 Facilitators: Zonie Crutcher-McCall, LRT,CTRS Location: [address] Dayroom   Goal Area(s) Addresses:  Patient will effectively work with peer towards shared goal.  Patient will identify skills used to make activity successful.  Patient will identify how skills used during activity can be used to reach post d/c goals.    Group Description: Landing Pad. In teams of 3-5, patients were given 12 plastic drinking straws and an equal length of masking tape. Using the materials provided, patients were asked to build a landing pad to catch a golf ball dropped from approximately 5 feet in the air. All materials were required to be used by the team in their design. LRT facilitated post-activity discussion.   Affect/Mood: Appropriate   Participation Level: Engaged   Participation Quality: Independent   Behavior: Appropriate   Speech/Thought Process: Focused   Insight: Good   Judgement: Good   Modes of Intervention: STEM Activity   Patient Response to Interventions:  Engaged   Education Outcome:  Acknowledges education and In group clarification offered    Clinical Observations/Individualized Feedback: Pt was engaged and worked well with peers in completing activity.    Plan: Continue to engage patient in RT group sessions 2-3x/week.   Anna Nguyen, LRT,CTRS 06/13/2022 12:23 PM

## 2023-03-17 ENCOUNTER — Emergency Department (HOSPITAL_COMMUNITY): Payer: 59

## 2023-03-17 ENCOUNTER — Other Ambulatory Visit: Payer: Self-pay

## 2023-03-17 ENCOUNTER — Emergency Department (HOSPITAL_COMMUNITY)
Admission: EM | Admit: 2023-03-17 | Discharge: 2023-03-17 | Disposition: A | Discharge status: Home / Self Care | Payer: 59 | Attending: Emergency Medicine | Admitting: Emergency Medicine

## 2023-03-17 ENCOUNTER — Encounter (HOSPITAL_COMMUNITY): Payer: Self-pay | Admitting: Emergency Medicine

## 2023-03-17 DIAGNOSIS — M25511 Pain in right shoulder: Secondary | ICD-10-CM | POA: Insufficient documentation

## 2023-03-17 DIAGNOSIS — M546 Pain in thoracic spine: Secondary | ICD-10-CM | POA: Insufficient documentation

## 2023-03-17 DIAGNOSIS — M25551 Pain in right hip: Secondary | ICD-10-CM | POA: Diagnosis not present

## 2023-03-17 DIAGNOSIS — Z91040 Latex allergy status: Secondary | ICD-10-CM | POA: Diagnosis not present

## 2023-03-17 DIAGNOSIS — R42 Dizziness and giddiness: Secondary | ICD-10-CM | POA: Insufficient documentation

## 2023-03-17 DIAGNOSIS — M542 Cervicalgia: Secondary | ICD-10-CM | POA: Insufficient documentation

## 2023-03-17 DIAGNOSIS — Z041 Encounter for examination and observation following transport accident: Secondary | ICD-10-CM

## 2023-03-17 DIAGNOSIS — Y92410 Unspecified street and highway as the place of occurrence of the external cause: Secondary | ICD-10-CM | POA: Insufficient documentation

## 2023-03-17 LAB — PREGNANCY, URINE: Preg Test, Ur: NEGATIVE

## 2023-03-17 MED ORDER — TRAMADOL HCL 50 MG PO TABS
50.0000 mg | ORAL_TABLET | Freq: Once | ORAL | Status: AC
  Administered 2023-03-17: 50 mg via ORAL
  Filled 2023-03-17: qty 1

## 2023-03-17 MED ORDER — TRAMADOL HCL 50 MG PO TABS: 50.0000 mg | ORAL_TABLET | Freq: Four times a day (QID) | ORAL | 0 refills | Status: AC | PRN

## 2023-03-17 MED ORDER — ONDANSETRON 4 MG PO TBDP
4.0000 mg | ORAL_TABLET | Freq: Once | ORAL | Status: AC
  Administered 2023-03-17: 4 mg via ORAL
  Filled 2023-03-17: qty 1
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
## 2023-03-17 NOTE — Discharge Instructions (Signed)
Take tramadol tablets every six hours as needed for pain. Follow up with PCP if pain worsens.
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
## 2023-03-18 ENCOUNTER — Ambulatory Visit: Payer: 59 | Admitting: Internal Medicine

## 2023-03-18 ENCOUNTER — Telehealth: Payer: Self-pay | Admitting: Internal Medicine

## 2023-03-18 VITALS — BP 122/80 | HR 89 | Temp 98.8°F | Ht 69.0 in | Wt 222.0 lb

## 2023-03-18 DIAGNOSIS — S29012D Strain of muscle and tendon of back wall of thorax, subsequent encounter: Secondary | ICD-10-CM | POA: Diagnosis not present

## 2023-03-18 DIAGNOSIS — I10 Essential (primary) hypertension: Secondary | ICD-10-CM

## 2023-03-18 DIAGNOSIS — F32A Depression, unspecified: Secondary | ICD-10-CM | POA: Insufficient documentation

## 2023-03-18 DIAGNOSIS — M5412 Radiculopathy, cervical region: Secondary | ICD-10-CM

## 2023-03-18 DIAGNOSIS — S060X0D Concussion without loss of consciousness, subsequent encounter: Secondary | ICD-10-CM

## 2023-03-18 MED ORDER — HYDROCODONE-ACETAMINOPHEN 5-325 MG PO TABS: 1.0000 | ORAL_TABLET | Freq: Four times a day (QID) | ORAL | 0 refills | Status: AC | PRN

## 2023-03-18 MED ORDER — PREDNISONE 10 MG PO TABS: ORAL_TABLET | ORAL | 0 refills | Status: DC

## 2023-03-18 NOTE — Telephone Encounter (Signed)
Pharmacy called about medications that was prescribed which is below. She was very concern about what was giving to the pt and wanted you or your nurse to call the pharmacy @ [phone] [facility].   HYDROcodone-acetaminophen (NORCO/VICODIN) 5-325 MG tablet    traMADol (ULTRAM) 50 MG tablet   Pharmacy was very concern because
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
## 2023-03-18 NOTE — Telephone Encounter (Signed)
Ok to cancel the tramadol - pt states had No Effect  So pt was seen in office and given different pain medication  Pt know to not take both at same time  Pt is not drug seeking
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
## 2023-03-19 NOTE — Telephone Encounter (Signed)
Spoke with pharmacy and states understanding.

## 2023-03-21 ENCOUNTER — Encounter: Payer: Self-pay | Admitting: Internal Medicine

## 2023-03-21 DIAGNOSIS — M5412 Radiculopathy, cervical region: Secondary | ICD-10-CM | POA: Insufficient documentation

## 2023-03-21 DIAGNOSIS — S060XAA Concussion with loss of consciousness status unknown, initial encounter: Secondary | ICD-10-CM | POA: Insufficient documentation

## 2023-03-21 DIAGNOSIS — S29012A Strain of muscle and tendon of back wall of thorax, initial encounter: Secondary | ICD-10-CM | POA: Insufficient documentation

## 2023-03-21 NOTE — Assessment & Plan Note (Signed)
Mild to mod, declines need for muscle relaxer prn, to f/u any worsening symptoms or concerns

## 2023-03-21 NOTE — Assessment & Plan Note (Addendum)
Mild to mod, for gabapentin 100 tid she already has at home, hydrocodone 5 325 prn, and prednisone short course,  to f/u any worsening symptoms or concerns, given work note

## 2023-03-21 NOTE — Assessment & Plan Note (Signed)
Mild to mod, d/w pt - for supportive care, consider neurology referral if persists,  to f/u any worsening symptoms or concerns

## 2023-03-21 NOTE — Assessment & Plan Note (Signed)
BP Readings from Last 3 Encounters:  03/18/23 122/80  03/17/23 (!) 145/94  06/10/22 136/78   Stable, pt to continue medical treatment  - diet, wt control

## 2023-12-15 ENCOUNTER — Encounter: Payer: Self-pay | Admitting: Internal Medicine

## 2023-12-15 ENCOUNTER — Ambulatory Visit: Admitting: Internal Medicine

## 2023-12-15 VITALS — BP 120/76 | HR 95 | Temp 98.2°F | Ht 69.0 in | Wt 233.0 lb

## 2023-12-15 DIAGNOSIS — Z8639 Personal history of other endocrine, nutritional and metabolic disease: Secondary | ICD-10-CM | POA: Insufficient documentation

## 2023-12-15 DIAGNOSIS — E221 Hyperprolactinemia: Secondary | ICD-10-CM | POA: Insufficient documentation

## 2023-12-15 DIAGNOSIS — K912 Postsurgical malabsorption, not elsewhere classified: Secondary | ICD-10-CM | POA: Insufficient documentation

## 2023-12-15 DIAGNOSIS — I10 Essential (primary) hypertension: Secondary | ICD-10-CM

## 2023-12-15 DIAGNOSIS — M5441 Lumbago with sciatica, right side: Secondary | ICD-10-CM | POA: Insufficient documentation

## 2023-12-15 DIAGNOSIS — E559 Vitamin D deficiency, unspecified: Secondary | ICD-10-CM

## 2023-12-15 DIAGNOSIS — E118 Type 2 diabetes mellitus with unspecified complications: Secondary | ICD-10-CM

## 2023-12-15 MED ORDER — KETOROLAC TROMETHAMINE 30 MG/ML IJ SOLN
30.0000 mg | Freq: Once | INTRAMUSCULAR | Status: AC
  Administered 2023-12-15: 30 mg via INTRAMUSCULAR

## 2023-12-15 MED ORDER — PREDNISONE 10 MG PO TABS: ORAL_TABLET | ORAL | 0 refills | Status: AC

## 2023-12-15 MED ORDER — TRAMADOL HCL 50 MG PO TABS: 50.0000 mg | ORAL_TABLET | Freq: Four times a day (QID) | ORAL | 0 refills | Status: AC | PRN

## 2023-12-15 NOTE — Assessment & Plan Note (Signed)
 Last vitamin D Lab Results  Component Value Date   VD25OH 19.33 (L) 06/11/2022   Low, to start oral replacement

## 2023-12-15 NOTE — Patient Instructions (Signed)
 You had the Toradol (anti-inflammatory) shot today  Please take all new medication as prescribed-  tramadol for pain, prednisone  Please continue all other medications as before, and refills have been done if requested.  Please have the pharmacy call with any other refills you may need.  Please keep your appointments with your specialists as you may have planned  You will be contacted regarding the referral for: Physical Therapy  Please go to the XRAY Department in the first floor for the x-ray testing  You will be contacted by phone if any changes need to be made immediately.  Otherwise, you will receive a letter about your results with an explanation, but please check with MyChart first.

## 2023-12-15 NOTE — Progress Notes (Signed)
 Patient ID: Anna Nguyen, female   DOB: 05-29-1986, 38 y.o.   MRN: [id number]        Chief Complaint: follow up low back pain, dm, htn, low vit d       HPI:  Anna Nguyen is a 38 y.o. female here with c/o new onset 3 wks acute mod to severe lbp in the lowest lumbar midline and radiation to the right upper buttock area.  No LE symptoms, weakness or falls.  Seen at [facility] about 1 wk ago, better for a short time after what sounds like toradol and steroid shots.  Pain worse to bending forward, better with lumbar support. No inciting factor or unusual physical activity to cause this.  No hx of malignancy.  Pt denies chest pain, increased sob or doe, wheezing, orthopnea, PND, increased LE swelling, palpitations, dizziness or syncope.   Pt denies polydipsia, polyuria, or new focal neuro s/s.     Wt Readings from Last 3 Encounters:  12/15/23 233 lb (105.7 kg)  03/18/23 222 lb (100.7 kg)  06/09/22 227 lb 4.7 oz (103.1 kg)   BP Readings from Last 3 Encounters:  12/15/23 120/76  03/18/23 122/80  03/17/23 (!) 145/94         Past Medical History:  Diagnosis Date   Anemia, iron deficiency    HTN (hypertension)    with last pregnancy   PCOS (polycystic ovarian syndrome)    Type II or unspecified type diabetes mellitus without mention of complication, not stated as uncontrolled    not on insulin   Past Surgical History:  Procedure Laterality Date   CESAREAN SECTION N/A 07/08/2017   Procedure: CESAREAN SECTION;  Surgeon: Carrington Clamp, MD;  Location: [facility] BIRTHING SUITES;  Service: Obstetrics;  Laterality: N/A;   LAPAROSCOPIC GASTRIC BANDING     TONSILLECTOMY     WISDOM TOOTH EXTRACTION      reports that she has never smoked. She has never used smokeless tobacco. She reports that she does not drink alcohol and does not use drugs. family history includes Arthritis in an other family member; Asthma in her maternal grandfather and maternal grandmother; Cancer in her maternal aunt; Diabetes in her  maternal grandfather, maternal grandmother, and maternal uncle; Hyperlipidemia in her maternal grandfather, maternal grandmother, and maternal uncle; Hypertension in her maternal grandfather, maternal grandmother, and maternal uncle; Prostate cancer in her paternal grandfather. Allergies  Allergen Reactions   Aspirin Other (See Comments)    Pt cannot recall reaction   Nsaids Other (See Comments)    Pt cannot recall reaction   Latex Rash   Current Outpatient Medications on File Prior to Visit  Medication Sig Dispense Refill   albuterol (VENTOLIN HFA) 108 (90 Base) MCG/ACT inhaler Inhale 2 puffs into the lungs every 6 (six) hours as needed for wheezing or shortness of breath.     folic acid (FOLVITE) 1 MG tablet Take 1 tablet (1 mg total) by mouth daily. 90 tablet 1   hydrOXYzine (ATARAX) 25 MG tablet Take 1 tablet (25 mg total) by mouth 3 (three) times daily as needed for anxiety. 30 tablet 0   JUNEL FE 24 1-20 MG-MCG(24) tablet Take 1 tablet by mouth daily.     lamoTRIgine (LAMICTAL) 150 MG tablet Take 1 tablet (150 mg total) by mouth at bedtime. 30 tablet 0   Multiple Vitamin (MULTIVITAMIN WITH MINERALS) TABS tablet Take 1 tablet by mouth daily. 30 tablet 0   risperiDONE (RISPERDAL) 1 MG tablet Take 1 tablet (1 mg total) by  mouth at bedtime. 30 tablet 0   traZODone (DESYREL) 50 MG tablet Take 1 tablet (50 mg total) by mouth at bedtime as needed for sleep. 30 tablet 0   vitamin D3 (CHOLECALCIFEROL) 25 MCG tablet Take 1 tablet (1,000 Units total) by mouth daily. 30 tablet 0   FLUoxetine (PROZAC) 40 MG capsule Take 1 capsule (40 mg total) by mouth daily. 30 capsule 0   HYDROcodone-acetaminophen (NORCO/VICODIN) 5-325 MG tablet Take 1 tablet by mouth every 6 (six) hours as needed. (Patient not taking: Reported on 12/15/2023) 30 tablet 0   No current facility-administered medications on file prior to visit.        ROS:  All others reviewed and negative.  Objective        PE:  BP 120/76 (BP  Location: Right Arm, Patient Position: Sitting, Cuff Size: Normal)   Pulse 95   Temp 98.2 F (36.8 C) (Oral)   Ht 5\' 9"  (1.753 m)   Wt 233 lb (105.7 kg)   LMP 11/23/2023 (Exact Date)   SpO2 98%   BMI 34.41 kg/m                 Constitutional: Pt appears in NAD               HENT: Head: NCAT.                Right Ear: External ear normal.                 Left Ear: External ear normal.                Eyes: . Pupils are equal, round, and reactive to light. Conjunctivae and EOM are normal               Nose: without d/c or deformity               Neck: Neck supple. Gross normal ROM               Cardiovascular: Normal rate and regular rhythm.                 Pulmonary/Chest: Effort normal and breath sounds without rales or wheezing.                Abd:  Soft, NT, ND, + BS, no organomegaly               Neurological: Pt is alert. At baseline orientation, motor grossly intact               Skin: Skin is warm. No rashes, no other new lesions, LE edema - none; spine with mod tender without swelling to the lowest lumbar midline and right paraspinal areas               Psychiatric: Pt behavior is normal without agitation   Micro: none  Cardiac tracings I have personally interpreted today:  none  Pertinent Radiological findings (summarize): none   Lab Results  Component Value Date   WBC 6.0 06/09/2022   HGB 10.7 (L) 06/09/2022   HCT 33.7 (L) 06/09/2022   PLT 200 06/09/2022   GLUCOSE 114 (H) 06/09/2022   CHOL 154 06/11/2022   TRIG 73 06/11/2022   HDL 63 06/11/2022   LDLCALC 76 06/11/2022   ALT 12 06/09/2022   AST 13 (L) 06/09/2022   NA 138 06/09/2022   K 3.9 06/09/2022   CL 108 06/09/2022   CREATININE 0.98 06/09/2022  BUN 7 06/09/2022   CO2 23 06/09/2022   TSH 2.03 02/27/2021   HGBA1C 6.0 (H) 06/11/2022   MICROALBUR 1.13 10/03/2008   Assessment/Plan:  Anna Nguyen is a 38 y.o. Unavailable [8] female with  has a past medical history of Anemia, iron deficiency, HTN  (hypertension), PCOS (polycystic ovarian syndrome), and Type II or unspecified type diabetes mellitus without mention of complication, not stated as uncontrolled.  Hypertension BP Readings from Last 3 Encounters:  12/15/23 120/76  03/18/23 122/80  03/17/23 (!) 145/94   Stable, pt to continue medical treatment  - diet, wt control   Vitamin D deficiency Last vitamin D Lab Results  Component Value Date   VD25OH 19.33 (L) 06/11/2022   Low, to start oral replacement   Type II diabetes mellitus with manifestations (HCC) Lab Results  Component Value Date   HGBA1C 6.0 (H) 06/11/2022   Stable, pt to continue current medical treatment  - diet,wt control   Acute midline low back pain with right-sided sciatica I suspect new lumbar disc disease vs djd (less likely), for ls spine film, Physical therapy, toradol 30 mg IM today, tramadol prn, and prednisone taper.  Also for f/u pcp for worsening, consider MRI after 6 wks conservative tx if symtpoms persist  Followup: Return if symptoms worsen or fail to improve.  Oliver Barre, MD 12/15/2023 8:19 PM [facility]

## 2023-12-15 NOTE — Assessment & Plan Note (Signed)
 I suspect new lumbar disc disease vs djd (less likely), for ls spine film, Physical therapy, toradol 30 mg IM today, tramadol prn, and prednisone taper.  Also for f/u pcp for worsening, consider MRI after 6 wks conservative tx if symtpoms persist

## 2023-12-15 NOTE — Assessment & Plan Note (Signed)
 Lab Results  Component Value Date   HGBA1C 6.0 (H) 06/11/2022   Stable, pt to continue current medical treatment  - diet,wt control

## 2023-12-15 NOTE — Assessment & Plan Note (Signed)
 BP Readings from Last 3 Encounters:  12/15/23 120/76  03/18/23 122/80  03/17/23 (!) 145/94   Stable, pt to continue medical treatment  - diet, wt control

## 2023-12-18 ENCOUNTER — Ambulatory Visit: Attending: Internal Medicine

## 2023-12-18 ENCOUNTER — Other Ambulatory Visit: Payer: Self-pay

## 2023-12-18 DIAGNOSIS — M5441 Lumbago with sciatica, right side: Secondary | ICD-10-CM | POA: Diagnosis present
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
## 2023-12-23 ENCOUNTER — Encounter: Payer: Self-pay | Admitting: Physical Therapy

## 2023-12-23 ENCOUNTER — Ambulatory Visit: Admitting: Physical Therapy

## 2023-12-23 DIAGNOSIS — M5441 Lumbago with sciatica, right side: Secondary | ICD-10-CM

## 2023-12-24 ENCOUNTER — Ambulatory Visit

## 2023-12-29 ENCOUNTER — Telehealth: Payer: Self-pay

## 2023-12-29 NOTE — Telephone Encounter (Signed)
 Spoke to pt about no show appt on 12/24/23. Pt stated she has been sick and at this time she would like to cancel all her appt and call back to schedule when she feels better.

## 2024-01-01 ENCOUNTER — Ambulatory Visit: Admitting: Physical Therapy

## 2024-01-06 ENCOUNTER — Ambulatory Visit

## 2024-01-07 ENCOUNTER — Ambulatory Visit: Admitting: Physical Therapy

## 2024-01-12 ENCOUNTER — Encounter: Admitting: Physical Therapy

## 2024-01-13 ENCOUNTER — Encounter
